# Patient Record
Sex: Male | Born: 1935
Health system: Southern US, Community
[De-identification: ages and names within clinical notes are randomized; demographics above are authoritative.]

## PROBLEM LIST (undated history)

## (undated) DIAGNOSIS — E7800 Pure hypercholesterolemia, unspecified: Secondary | ICD-10-CM

## (undated) DIAGNOSIS — I10 Essential (primary) hypertension: Secondary | ICD-10-CM

## (undated) DIAGNOSIS — K219 Gastro-esophageal reflux disease without esophagitis: Secondary | ICD-10-CM

## (undated) DIAGNOSIS — R609 Edema, unspecified: Secondary | ICD-10-CM

## (undated) DIAGNOSIS — I639 Cerebral infarction, unspecified: Secondary | ICD-10-CM

## (undated) DIAGNOSIS — E785 Hyperlipidemia, unspecified: Secondary | ICD-10-CM

## (undated) DIAGNOSIS — I4891 Unspecified atrial fibrillation: Secondary | ICD-10-CM

## (undated) DIAGNOSIS — I4892 Unspecified atrial flutter: Secondary | ICD-10-CM

## (undated) HISTORY — DX: Pure hypercholesterolemia, unspecified: E78.00

## (undated) HISTORY — DX: Unspecified atrial fibrillation: I48.91

## (undated) HISTORY — DX: Essential (primary) hypertension: I10

## (undated) HISTORY — DX: Unspecified atrial flutter: I48.92

## (undated) HISTORY — DX: Edema, unspecified: R60.9

## (undated) HISTORY — DX: Gastro-esophageal reflux disease without esophagitis: K21.9

## (undated) HISTORY — DX: Hyperlipidemia, unspecified: E78.5

---

## 1999-02-17 ENCOUNTER — Encounter: Payer: Self-pay | Admitting: *Deleted

## 1999-02-17 ENCOUNTER — Ambulatory Visit (HOSPITAL_COMMUNITY): Admission: RE | Admit: 1999-02-17 | Discharge: 1999-02-17 | Payer: Self-pay | Admitting: *Deleted

## 2003-04-16 ENCOUNTER — Encounter: Admission: RE | Admit: 2003-04-16 | Discharge: 2003-04-16 | Payer: Self-pay | Admitting: Gastroenterology

## 2003-04-16 ENCOUNTER — Encounter: Payer: Self-pay | Admitting: Gastroenterology

## 2003-04-30 ENCOUNTER — Encounter (INDEPENDENT_AMBULATORY_CARE_PROVIDER_SITE_OTHER): Payer: Self-pay | Admitting: *Deleted

## 2003-04-30 ENCOUNTER — Ambulatory Visit (HOSPITAL_COMMUNITY): Admission: RE | Admit: 2003-04-30 | Discharge: 2003-04-30 | Payer: Self-pay | Admitting: Gastroenterology

## 2005-10-25 ENCOUNTER — Inpatient Hospital Stay (HOSPITAL_COMMUNITY): Admission: EM | Admit: 2005-10-25 | Discharge: 2005-10-31 | Payer: Self-pay | Admitting: Emergency Medicine

## 2005-10-25 ENCOUNTER — Encounter: Payer: Self-pay | Admitting: Internal Medicine

## 2005-10-26 ENCOUNTER — Encounter (INDEPENDENT_AMBULATORY_CARE_PROVIDER_SITE_OTHER): Payer: Self-pay | Admitting: Interventional Cardiology

## 2006-02-13 ENCOUNTER — Ambulatory Visit (HOSPITAL_COMMUNITY): Admission: RE | Admit: 2006-02-13 | Discharge: 2006-02-13 | Payer: Self-pay | Admitting: Interventional Cardiology

## 2006-02-13 ENCOUNTER — Encounter: Payer: Self-pay | Admitting: Internal Medicine

## 2007-02-15 ENCOUNTER — Encounter: Payer: Self-pay | Admitting: Internal Medicine

## 2007-02-15 ENCOUNTER — Ambulatory Visit (HOSPITAL_COMMUNITY): Admission: RE | Admit: 2007-02-15 | Discharge: 2007-02-15 | Payer: Self-pay | Admitting: Interventional Cardiology

## 2008-01-31 ENCOUNTER — Encounter: Payer: Self-pay | Admitting: Internal Medicine

## 2008-01-31 ENCOUNTER — Ambulatory Visit (HOSPITAL_COMMUNITY): Admission: RE | Admit: 2008-01-31 | Discharge: 2008-01-31 | Payer: Self-pay | Admitting: Interventional Cardiology

## 2008-05-28 ENCOUNTER — Ambulatory Visit: Payer: Self-pay | Admitting: Internal Medicine

## 2008-06-30 ENCOUNTER — Ambulatory Visit: Payer: Self-pay | Admitting: Internal Medicine

## 2008-07-16 ENCOUNTER — Ambulatory Visit: Payer: Self-pay | Admitting: Internal Medicine

## 2008-10-14 ENCOUNTER — Ambulatory Visit: Payer: Self-pay | Admitting: Internal Medicine

## 2009-03-17 DIAGNOSIS — I498 Other specified cardiac arrhythmias: Secondary | ICD-10-CM | POA: Insufficient documentation

## 2009-03-17 DIAGNOSIS — I10 Essential (primary) hypertension: Secondary | ICD-10-CM | POA: Insufficient documentation

## 2009-03-17 DIAGNOSIS — R609 Edema, unspecified: Secondary | ICD-10-CM | POA: Insufficient documentation

## 2009-03-17 DIAGNOSIS — I4892 Unspecified atrial flutter: Secondary | ICD-10-CM | POA: Insufficient documentation

## 2009-03-17 HISTORY — DX: Edema, unspecified: R60.9

## 2009-03-18 ENCOUNTER — Ambulatory Visit: Payer: Self-pay | Admitting: Internal Medicine

## 2009-08-31 ENCOUNTER — Telehealth: Payer: Self-pay | Admitting: Internal Medicine

## 2009-09-22 ENCOUNTER — Ambulatory Visit: Payer: Self-pay | Admitting: Internal Medicine

## 2009-09-22 ENCOUNTER — Encounter: Payer: Self-pay | Admitting: Internal Medicine

## 2009-09-22 ENCOUNTER — Ambulatory Visit: Payer: Self-pay | Admitting: Cardiology

## 2009-09-22 LAB — CONVERTED CEMR LAB: POC INR: 2

## 2009-09-29 ENCOUNTER — Ambulatory Visit: Payer: Self-pay | Admitting: Cardiovascular Disease

## 2009-09-29 LAB — CONVERTED CEMR LAB: POC INR: 2.2

## 2009-10-04 ENCOUNTER — Telehealth: Payer: Self-pay | Admitting: Internal Medicine

## 2009-10-06 ENCOUNTER — Ambulatory Visit: Payer: Self-pay | Admitting: Cardiology

## 2009-10-06 ENCOUNTER — Ambulatory Visit: Payer: Self-pay | Admitting: Internal Medicine

## 2009-10-06 ENCOUNTER — Telehealth (INDEPENDENT_AMBULATORY_CARE_PROVIDER_SITE_OTHER): Payer: Self-pay | Admitting: *Deleted

## 2009-10-07 LAB — CONVERTED CEMR LAB
BUN: 16 mg/dL (ref 6–23)
Basophils Absolute: 0 10*3/uL (ref 0.0–0.1)
Basophils Relative: 0.5 % (ref 0.0–3.0)
CO2: 29 meq/L (ref 19–32)
Calcium: 9.9 mg/dL (ref 8.4–10.5)
Chloride: 101 meq/L (ref 96–112)
Creatinine, Ser: 1.3 mg/dL (ref 0.4–1.5)
Eosinophils Absolute: 0.2 10*3/uL (ref 0.0–0.7)
Eosinophils Relative: 2.5 % (ref 0.0–5.0)
GFR calc non Af Amer: 56.77 mL/min (ref >60)
Glucose, Bld: 123 mg/dL — ABNORMAL HIGH (ref 70–99)
HCT: 46.5 % (ref 39.0–52.0)
Hemoglobin: 15.2 g/dL (ref 13.0–17.0)
Lymphocytes Relative: 30.7 % (ref 12.0–46.0)
Lymphs Abs: 2.2 10*3/uL (ref 0.7–4.0)
MCHC: 32.7 g/dL (ref 30.0–36.0)
MCV: 102.5 fL — ABNORMAL HIGH (ref 78.0–100.0)
Magnesium: 2.1 mg/dL (ref 1.5–2.5)
Monocytes Absolute: 0.7 10*3/uL (ref 0.1–1.0)
Monocytes Relative: 10 % (ref 3.0–12.0)
Neutro Abs: 4.2 10*3/uL (ref 1.4–7.7)
Neutrophils Relative %: 56.3 % (ref 43.0–77.0)
Platelets: 236 10*3/uL (ref 150.0–400.0)
Potassium: 4.1 meq/L (ref 3.5–5.1)
RBC: 4.54 M/uL (ref 4.22–5.81)
RDW: 11.7 % (ref 11.5–14.6)
Sodium: 139 meq/L (ref 135–145)
WBC: 7.3 10*3/uL (ref 4.5–10.5)

## 2009-10-08 ENCOUNTER — Ambulatory Visit: Payer: Self-pay | Admitting: Internal Medicine

## 2009-10-08 ENCOUNTER — Ambulatory Visit (HOSPITAL_COMMUNITY): Admission: RE | Admit: 2009-10-08 | Discharge: 2009-10-08 | Payer: Self-pay | Admitting: Internal Medicine

## 2009-10-15 ENCOUNTER — Ambulatory Visit: Payer: Self-pay | Admitting: Cardiology

## 2009-10-15 LAB — CONVERTED CEMR LAB: POC INR: 2.7

## 2009-11-12 ENCOUNTER — Ambulatory Visit: Payer: Self-pay | Admitting: Cardiovascular Disease

## 2009-11-12 LAB — CONVERTED CEMR LAB: POC INR: 2.5

## 2009-12-08 ENCOUNTER — Telehealth (INDEPENDENT_AMBULATORY_CARE_PROVIDER_SITE_OTHER): Payer: Self-pay | Admitting: *Deleted

## 2010-09-25 LAB — CONVERTED CEMR LAB: POC INR: 2.3

## 2010-09-29 NOTE — Medication Information (Signed)
Summary: coumadin check for cardioversion  Anticoagulant Therapy  Managed by: Cloyde Reams, RN, BSN Referring MD: Ladona Ridgel MD, Chauncey Fischer MD: Jens Som MD, Arlys John Indication 1: Atrial Fibrillation Indication 2: Pending DCCV 09/2010 Lab Used: [facility] [place] Site: [address] INR POC 2.3 INR RANGE 2.0-3.0           Allergies: No Known Drug Allergies  Anticoagulation Management History:      The patient is taking warfarin and comes in today for a routine follow up visit.  Positive risk factors for bleeding include an age of 3 years or older.  The bleeding index is 'intermediate risk'.  Positive CHADS2 values include History of HTN and Age > 76 years old.  Anticoagulation responsible provider: Jens Som MD, Arlys John.  INR POC: 2.3.  Cuvette Lot#: [id number].  Exp: 11/2010.    Anticoagulation Management Assessment/Plan:      The patient's current anticoagulation dose is Warfarin sodium 1 mg tabs: Use as directed by Anticoagualtion Clinic.  The target INR is 2.0-3.0.  The next INR is due 10/15/2009.  Anticoagulation instructions were given to patient.  Results were reviewed/authorized by Cloyde Reams, RN, BSN.  He was notified by Cloyde Reams RN.         Prior Anticoagulation Instructions: INR 2.2  Start taking 1 tablet daily except 1/2 tablet on Mondays and Fridays.  Recheck in 1 week.  Current Anticoagulation Instructions: INR 2.3  Take an extra 1/2 table today then resume same dosage of 1 tablet daily except 1/2 tablet on Mondays and Fridays.  Recheck in 1 week.

## 2010-09-29 NOTE — Progress Notes (Signed)
Summary: hr 136 At Flutter  Metoprolol increased cardioversion 10/08/09  Phone Note Other Incoming   Caller: pt here for bloodwork before cardioversion Details for Reason: abn EKG Summary of Call: checked EKG prior to cardioversion scheduled for 10/08/2009 - At Flutter HR 136, Bp 142/108 and 156/101.  pt states at home this am BP was 123/89.  Past Dr Ludwig Clarks review - pt is to increase Metoprolol to 100mg  twice a day until the time of his cardioversion.  pt aware. Initial call taken by: Charolotte Capuchin, RN,  October 06, 2009 10:44 AM      Patient Instructions: 1)  Your physician has recommended you make the following change in your medication: increase Metoprolol to 100 mg twice a day. 2)  Keep appoitment on 10/08/09 for cardioversion as scheduled 3)  Your physician has recommended that you have a cardioversion (DCCV).  Electrical cardioversion uses a jolt of electricity to your heart either through paddles or wired patches attached to your chest. This is a controlled, usually prescheduled, procedure. Defibrillation is done under light anesthesia in the hospital, and you usually go home the day of the procedure. This is done to get your heart back into a normal rhythm. You are not awake for the procedure. Please see the instruction sheet given to you today. 4)  You have been diagnosed with atrial flutter. Atrial flutter is a condition in which one of the upper chambers of the heart has extra electrical cells causing it to beat very fast. Please see the handout/brochure given to you today for further information.

## 2010-09-29 NOTE — Medication Information (Signed)
Summary: rov/ewj  Anticoagulant Therapy  Managed by: Inactive Referring MD: Ladona Ridgel MD, Chauncey Fischer MD: Clifton James MD, Cristal Deer Indication 1: Atrial Fibrillation Indication 2: Pending DCCV 09/2010 Lab Used: [facility] [place] Site: [address] INR POC 2.5 INR RANGE 2.0-3.0  Dietary changes: no    Health status changes: no    Bleeding/hemorrhagic complications: no    Recent/future hospitalizations: no    Any changes in medication regimen? no    Recent/future dental: no  Any missed doses?: no       Is patient compliant with meds? yes      Comments: Pt. wants to follow back up at Dr. Katrinka Blazing office with Mason Jim Smart.   Allergies: No Known Drug Allergies  Anticoagulation Management History:      The patient is taking warfarin and comes in today for a routine follow up visit.  Positive risk factors for bleeding include an age of 75 years or older.  The bleeding index is 'intermediate risk'.  Positive CHADS2 values include History of HTN and Age > 38 years old.  Anticoagulation responsible provider: Clifton James MD, Cristal Deer.  INR POC: 2.5.  Cuvette Lot#: [id number].  Exp: 12/2010.    Anticoagulation Management Assessment/Plan:      The patient's current anticoagulation dose is Warfarin sodium 1 mg tabs: Use as directed by Anticoagualtion Clinic.  The target INR is 2.0-3.0.  The next INR is due 12/08/2009.  Anticoagulation instructions were given to patient.  Results were reviewed/authorized by Inactive.  He was notified by Bethena Midget, RN, BSN.         Prior Anticoagulation Instructions: INR 2.7  Continue on same dosage 1 tablet daily except 1/2 tablet on Mondays and Fridays.  Recheck in 4 weeks.    Current Anticoagulation Instructions: INR 2.5 Continue 5mg s daily except 2.5mg s on Mondays and Fridays. Recheck in 4 weeks. Will recheck back with Dr. Katrinka Blazing office on 12/08/09 at 11am.

## 2010-09-29 NOTE — Progress Notes (Signed)
  Faxed LOV,12 Lead over to Ilene/r.Smith office to [phone] [facility]  December 08, 2009 12:03 PM

## 2010-09-29 NOTE — Medication Information (Signed)
Summary: rov/ewj  Anticoagulant Therapy  Managed by: Cloyde Reams, RN, BSN Referring MD: Ladona Ridgel MD, Chauncey Fischer MD: Shirlee Latch MD, Jasyah Theurer Indication 1: Atrial Fibrillation Indication 2: Pending DCCV 09/2010 Lab Used: [facility] [place] Site: [address] INR POC 2.7 INR RANGE 2.0-3.0  Dietary changes: no    Health status changes: no    Bleeding/hemorrhagic complications: no    Recent/future hospitalizations: yes       Details: Had DCCV 10/08/09 sick since then with cold.    Any changes in medication regimen? yes       Details: Tylenol prn  Recent/future dental: no  Any missed doses?: no       Is patient compliant with meds? yes       Allergies (verified): No Known Drug Allergies  Anticoagulation Management History:      The patient is taking warfarin and comes in today for a routine follow up visit.  Positive risk factors for bleeding include an age of 26 years or older.  The bleeding index is 'intermediate risk'.  Positive CHADS2 values include History of HTN and Age > 45 years old.  Anticoagulation responsible provider: Shirlee Latch MD, Saulo Anthis.  INR POC: 2.7.  Cuvette Lot#: [id number].  Exp: 11/2010.    Anticoagulation Management Assessment/Plan:      The patient's current anticoagulation dose is Warfarin sodium 1 mg tabs: Use as directed by Anticoagualtion Clinic.  The target INR is 2.0-3.0.  The next INR is due 11/12/2009.  Anticoagulation instructions were given to patient.  Results were reviewed/authorized by Cloyde Reams, RN, BSN.  He was notified by Cloyde Reams RN.         Prior Anticoagulation Instructions: INR 2.3  Take an extra 1/2 table today then resume same dosage of 1 tablet daily except 1/2 tablet on Mondays and Fridays.  Recheck in 1 week.  Current Anticoagulation Instructions: INR 2.7  Continue on same dosage 1 tablet daily except 1/2 tablet on Mondays and Fridays.  Recheck in 4 weeks.

## 2010-09-29 NOTE — Medication Information (Signed)
Summary: rov/tm  Anticoagulant Therapy  Managed by: Cloyde Reams, RN, BSN Referring MD: Ladona Ridgel MD, Chauncey Fischer MD: Eden Emms MD, Theron Arista Indication 1: Atrial Fibrillation Indication 2: Pending DCCV 09/2010 Lab Used: [facility] [place] Site: [address] INR POC 2.2 INR RANGE 2.0-3.0  Dietary changes: no    Health status changes: no    Bleeding/hemorrhagic complications: no    Recent/future hospitalizations: no    Any changes in medication regimen? no    Recent/future dental: no  Any missed doses?: no       Is patient compliant with meds? yes      Comments: Pt states he has been taking 5mg  daily/2.5mg  MWF.    Allergies (verified): No Known Drug Allergies  Anticoagulation Management History:      The patient is taking warfarin and comes in today for a routine follow up visit.  Positive risk factors for bleeding include an age of 75 years or older.  The bleeding index is 'intermediate risk'.  Positive CHADS2 values include History of HTN and Age > 64 years old.  Anticoagulation responsible provider: Eden Emms MD, Theron Arista.  INR POC: 2.2.  Cuvette Lot#: [id number].  Exp: 11/2010.    Anticoagulation Management Assessment/Plan:      The patient's current anticoagulation dose is Warfarin sodium 1 mg tabs: Use as directed by Anticoagualtion Clinic.  The target INR is 2.0-3.0.  The next INR is due 10/06/2009.  Anticoagulation instructions were given to patient.  Results were reviewed/authorized by Cloyde Reams, RN, BSN.  He was notified by Cloyde Reams RN.         Prior Anticoagulation Instructions: INR 2.0 Change dose to 5mg s everyday except 2.5mg s on Mondays and Fridays. Recheck in one week.   Current Anticoagulation Instructions: INR 2.2  Start taking 1 tablet daily except 1/2 tablet on Mondays and Fridays.  Recheck in 1 week.

## 2010-09-29 NOTE — Letter (Signed)
Summary: Cardioversion/TEE Instructions  Architectural technologist, Main Office  [address]   [place], [place] [address]   Phone: [phone]  Fax: [phone]    Cardioversion / TEE Cardioversion Instructions  You are scheduled for a Cardioversion  on Oct 08, 2009 with Dr. Ladona Ridgel.   Please arrive at the [facility] at 1100 a.m. on the day of your procedure.  1)   DIET:  A)   Nothing to eat or drink after midnight except your medications with a sip of water.   2)   Come to the [place] office on Oct 04, 2009 for lab work. The lab at [facility] is open from 8:30 a.m. to 1:30 p.m. and 2:30 p.m. to 5:00 p.m. The lab at [facility] is open from 7:30 a.m. to 5:30 p.m. You do not have to be fasting.  3)   MAKE SURE YOU TAKE YOUR COUMADIN.  A)   YOU MAY TAKE ALL of your remaining medications with a small amount of water.   4)  Must have a responsible person to drive you home.  5)   Bring a current list of your medications and current insurance cards.   * Special Note:  Every effort is made to have your procedure done on time. Occasionally there are emergencies that present themselves at the hospital that may cause delays. Please be patient if a delay does occur.  * If you have any questions after you get home, please call the office at [phone].  Appended Document: Cardioversion/TEE Instructions LABS CHANGED TO 10/06/09

## 2010-09-29 NOTE — Progress Notes (Signed)
Summary: irregular HR  Phone Note Call from Patient Call back at Home Phone [phone]   Caller: Spouse Reason for Call: Talk to Nurse Summary of Call: Spouse states patient is experiencing irregular heart rate.  PA appointment offered, declined.  Scheduled with [facility] 09-22-08.  Would like to speak to RN concerning symptoms. Initial call taken by: Burnard Leigh,  August 31, 2009 9:53 AM  Follow-up for Phone Call        BP good today at [facility] clinic with Riki Rusk at Dr Katrinka Blazing  Wife says they said it was perfect.  Now on Toprol 50 mg daily. Made an apt for 09/22/09.  If she feels like his BP gets to low or his HR is getting too high we will move his apt up.  Pt's wife will call me back if symptoms worsen. Dennis Bast, RN, BSN  August 31, 2009 11:56 AM

## 2010-09-29 NOTE — Assessment & Plan Note (Signed)
Summary: rov/kfw   History of Present Illness: [name] returns today for followup.  He is a very pleasant 77 year old male with a history of sinus bradycardia, which has been asymptomatic and hypertension, also with a history of atrial flutter. He has done quite well over the past 6 months.  He denies c/p, sob, syncope or palpitations.  He notes that his blood pressure has been on the low side at times but he does not feel dizzy.   Current Medications (verified): 1)  Amlodipine Besylate 5 Mg Tabs (Amlodipine Besylate) .... Take One-Half  Tablet By Mouth Daily 2)  Warfarin Sodium 1 Mg Tabs (Warfarin Sodium) .... Use As Directed By Anticoagualtion Clinic 3)  Benicar Hct 20-12.5 Mg Tabs (Olmesartan Medoxomil-Hctz) .... Daily 4)  Lipitor 20 Mg Tabs (Atorvastatin Calcium) .... Take One Tablet By Mouth Daily. 5)  Calcarb 600 1500 Mg Tabs (Calcium Carbonate) .Marland Kitchen.. 1 By Mouth Once Daily 6)  Fish Oil   Oil (Fish Oil) .Marland Kitchen.. 1 By Mouth Once Daily 7)  Multivitamins   Tabs (Multiple Vitamin) .Marland Kitchen.. 1 By Mouth Once Daily 8)  Metoprolol Succinate 100 Mg Xr24h-Tab (Metoprolol Succinate) .... Take One-Half Tablet By Mouth Daily  Allergies (verified): No Known Drug Allergies  Past History:  Past Medical History: Last updated: 03/17/2009 ATRIAL FLUTTER (ICD-427.32) BRADYCARDIA (ICD-427.89) EDEMA (ICD-782.3) HYPERTENSION (ICD-401.9) SUPRAVENTRICULAR TACHYCARDIA (ICD-427.89)    Past Surgical History: Last updated: 03/17/2009 Esophagogastroduodenoscopy with biopsy and endoscopic balloon  dilatation of a distal esophageal stricture. Transesophageal echocardiogram and cardioversion.  Cardioversion.  01/31/2008  Jake Bathe, MD   Elective electrical cardioversion.  02/13/2006  Lyn Records, M.D.       Family History: Last updated: 03/17/2009  He is married and lives in [place].  He has one child.  No tobacco, alcohol, or illicit drug use.  Social History: Last updated: 03/17/2009  He is married and lives in [place].  He has one child.  No tobacco, alcohol, or illicit drug use.  Review of Systems  The patient denies chest pain, syncope, dyspnea on exertion, and peripheral edema.    Vital Signs:  Patient profile:   77 year old male Height:      69 inches Weight:      189 pounds BMI:     28.01 Pulse rate:   150 / minute Pulse rhythm:   irregular BP sitting:   146 / 84  (left arm) Cuff size:   regular  Vitals Entered By: Judithe Modest CMA (September 22, 2009 9:10 AM)  Physical Exam  General:  Well developed, well nourished, in no acute distress. Head:  normocephalic and atraumatic Eyes:  PERRLA/EOM intact; conjunctiva and lids normal. Mouth:  Teeth, gums and palate normal. Oral mucosa normal. Neck:  Neck supple, no JVD. No masses, thyromegaly or abnormal cervical nodes. Lungs:  Clear bilaterally to auscultation. No wheezes, rales, or rhonchi. Heart:  Regular bradycardia.  Nomal S1 and S2.  PMI is not enlarged or laterally displaced.  No murmur. Abdomen:  Bowel sounds positive; abdomen soft and non-tender without masses, organomegaly, or hernias noted. No hepatosplenomegaly. Msk:  Back normal, normal gait. Muscle strength and tone normal. Pulses:  pulses normal in all 4 extremities Extremities:  No clubbing or cyanosis. Neurologic:  Alert and oriented x 3.   Impression & Recommendations:  Problem # 1:  ATRIAL FLUTTER (ICD-427.32) [name] has asymptomatic atrial flutter and I have discussed the treatment options with the patient.  I have recomended proceeding with DCCV to restore  NSR. His updated medication list for this problem includes:    Warfarin Sodium 1 Mg Tabs (Warfarin sodium) ..... Use as directed by anticoagualtion clinic    Metoprolol Succinate 100 Mg Xr24h-tab (Metoprolol succinate) .Marland Kitchen... Take one-half tablet by mouth twice daily  Orders: EKG w/ Interpretation (93000)  Problem # 2:  HYPERTENSION (ICD-401.9) The patient had been  instructed to maintain a low sodium diet and continue his current meds. His updated medication list for this problem includes:    Amlodipine Besylate 5 Mg Tabs (Amlodipine besylate) .Marland Kitchen... Take one-half  tablet by mouth daily    Benicar Hct 20-12.5 Mg Tabs (Olmesartan medoxomil-hctz) .Marland Kitchen... Daily    Metoprolol Succinate 100 Mg Xr24h-tab (Metoprolol succinate) .Marland Kitchen... Take one-half tablet by mouth twice daily  Problem # 3:  BRADYCARDIA (ICD-427.89) He has had a h/o bradycardia.  Will follow. His updated medication list for this problem includes:    Amlodipine Besylate 5 Mg Tabs (Amlodipine besylate) .Marland Kitchen... Take one-half  tablet by mouth daily    Warfarin Sodium 1 Mg Tabs (Warfarin sodium) ..... Use as directed by anticoagualtion clinic    Metoprolol Succinate 100 Mg Xr24h-tab (Metoprolol succinate) .Marland Kitchen... Take one-half tablet by mouth twice daily  Patient Instructions: 1)  Your physician has recommended you make the following change in your medication: INCREASE YOUR METOPROLOL TO 50MG  TWICE DAILY 2)  Your physician has recommended that you have a cardioversion (DCCV).  Electrical cardioversion uses a jolt of electricity to your heart either through paddles or wired patches attached to your chest. This is a controlled, usually prescheduled, procedure. Defibrillation is done under light anesthesia in the hospital, and you usually go home the day of the procedure. This is done to get your heart back into a normal rhythm. You are not awake for the procedure. Please see the instruction sheet given to you today. 3)  Your physician recommends that you return for lab work ON Oct 04, 2009 Prescriptions: METOPROLOL SUCCINATE 100 MG XR24H-TAB (METOPROLOL SUCCINATE) Take one-half tablet by mouth twice daily  #30 x 11   Entered by:   Duncan Dull, RN, BSN   Authorized by:   Laren Boom, MD, [facility]   Signed by:   Duncan Dull, RN, BSN on 09/22/2009   Method used:   Electronically to        [facility] (retail)       [address]       [place], [place]  [address]       Ph: [phone]       Fax: [phone]   RxID:   [id number]

## 2010-09-29 NOTE — Progress Notes (Signed)
Summary: CALLING REGARDING HIS CARDIOVERSION  Phone Note Call from Patient Call back at Home Phone [phone]   Caller: Patient Summary of Call: PT  CALLING REGARDING HIS PROCEDURE THE CARDIOVERSION. Initial call taken by: Judie Grieve,  October 04, 2009 11:08 AM  Follow-up for Phone Call        will check an EKG on Jhonnie Garner if he is in rhythm will cx DCCV Dennis Bast, RN, BSN  October 04, 2009 12:25 PM

## 2010-09-29 NOTE — Medication Information (Signed)
Summary: rov/tm  Anticoagulant Therapy  Managed by: Bethena Midget, RN, BSN Referring MD: Ladona Ridgel MD, Chauncey Fischer MD: Juanda Chance MD, Dezi Schaner Indication 1: Atrial Fibrillation Indication 2: Pending DCCV 09/2010 Lab Used: [facility] [place] Site: [address] INR POC 2.0 INR RANGE 2.0-3.0  Dietary changes: no    Health status changes: no    Bleeding/hemorrhagic complications: no    Recent/future hospitalizations: no    Any changes in medication regimen? no    Recent/future dental: no  Any missed doses?: no       Is patient compliant with meds? yes      Comments: Saw Dr. Ladona Ridgel today, now pending DCCV. Usually followed by [facility]. Telephoned Dr. Jimmye Norman and received pt. previous dose of 5mg s daily except 2.5mg s on MWF.   Allergies: No Known Drug Allergies  Anticoagulation Management History:      The patient comes in today for his initial visit for anticoagulation therapy.  Positive risk factors for bleeding include an age of 75 years or older.  The bleeding index is 'intermediate risk'.  Positive CHADS2 values include History of HTN and Age > 75 years old.  Anticoagulation responsible provider: Juanda Chance MD, Smitty Cords.  INR POC: 2.0.  Cuvette Lot#: [id number].  Exp: 11/2010.    Anticoagulation Management Assessment/Plan:      The patient's current anticoagulation dose is Warfarin sodium 1 mg tabs: Use as directed by Anticoagualtion Clinic.  The next INR is due 09/29/2009.  Anticoagulation instructions were given to patient.  Results were reviewed/authorized by Bethena Midget, RN, BSN.  He was notified by Bethena Midget, RN, BSN.         Current Anticoagulation Instructions: INR 2.0 Change dose to 5mg s everyday except 2.5mg s on Mondays and Fridays. Recheck in one week.   Appended Document: rov/tm Last INR per [facility] on 08/31/09 was 1.9

## 2010-09-29 NOTE — Miscellaneous (Signed)
Summary: Orders Update  Clinical Lists Changes  Orders: Added new Test order of T-2 View CXR (71020TC) - Signed 

## 2010-11-16 LAB — PROTIME-INR
INR: 2.57 — ABNORMAL HIGH (ref 0.00–1.49)
Prothrombin Time: 27.4 seconds — ABNORMAL HIGH (ref 11.6–15.2)

## 2011-01-10 NOTE — Consult Note (Signed)
NAMEGRAESON, NOURI NO.:  [id number]   MEDICAL RECORD NO.:  [id number]          PATIENT TYPE:  OIB   LOCATION:  2858                         FACILITY:  [facility]   PHYSICIAN:  Lyn Records, M.D.   DATE OF BIRTH:  1929/10/02   DATE OF CONSULTATION:  02/15/2007  DATE OF DISCHARGE:                                 CONSULTATION   INDICATION:  Atrial tachycardia, with a regular RR interval at a rate of  120 beats per minute.  The patient has a history of atrial flutter.   CURRENT MEDICATIONS:  His current medical regimen is:  1. Metoprolol-ER 75 mg per day.  2. Coumadin as directed.  3. Lipitor 20 mg a day.  4. Amiodarone 200 mg b.i.d.  5. Benicar 40/25 mg per day.  6. Multivitamins and calcium.   PROCEDURE:  International aid/development worker cardioversion.   DESCRIPTION:  Dr. Sampson Goon directed the anesthesia.  The patient was  given 150 mg of sodium pentothal.   After the patient was asleep, we converted to normal sinus rhythm/sinus  bradycardia with an anterior and posterior lead configuration using 75  watt-seconds of biphasic energy.  One discharge was performed.  Patient  awakened from anesthesia without complications.   PLAN:  1. We will decrease metoprolol dose to 50 mg per day.  2. Continue Coumadin.  3. Clinical followup in 2-3 weeks.  4. The patient should call if any clinical problems.  5. Decrease amiodarone to 200 mg per day.      Lyn Records, M.D.  Electronically Signed     HWS/MEDQ  D:  02/15/2007  T:  02/15/2007  Job:  [id number]   cc:   Stacie Acres. Cliffton Asters, M.D.

## 2011-01-10 NOTE — Op Note (Signed)
NAMEEWEN, VARNELL NO.:  [id number]   MEDICAL RECORD NO.:  [id number]          PATIENT TYPE:  OIB   LOCATION:  2854                         FACILITY:  [facility]   PHYSICIAN:  Jake Bathe, MD      DATE OF BIRTH:  1930/07/17   DATE OF PROCEDURE:  01/31/2008  DATE OF DISCHARGE:                               OPERATIVE REPORT   PROCEDURE:  Cardioversion.   INDICATIONS:  A 77 year old male on amiodarone with atrial  flutter/fibrillation on chronic Coumadin therapy, last INR 3.0.  He has  had previous cardioversion x3.  The patient of Dr. Verdis Prime.   PROCEDURE DETAILS:  Informed consent was obtained.  Risks and benefits  such as stroke, heart attack, death were explained to the patient.  With  anesthesia help, sedation was obtained.  Pads were placed in the  anterior and posterior chest wall.  Biphasic 120 joules were  administered x1.  This successfully cardioverted him from atrial flutter  to normal sinus rhythm at a rate of 66.  He tolerated the procedure well  with no complications.  Findings were discussed with his wife.   PLAN:  Follow up in office as previously scheduled.  Successful  cardioversion.      Jake Bathe, MD  Electronically Signed     MCS/MEDQ  D:  01/31/2008  T:  01/31/2008  Job:  [id number]

## 2011-01-10 NOTE — Letter (Signed)
May 28, 2008    Lyn Records, MD  [address]  [address]  [place] [address]   RE:  Alexander, Roman  MRN:  [id number]  /  DOB:  [birth year]   Dear Erskine Emer[facility],   Thank you for referring [name] for EP evaluation of atrial  flutter.  As you know, he is a very pleasant 77 year old man who  has a  history of atrial fibrillation dating back several years, but most  recently has been all atrial flutter.  The patient has had multiple  cardioversions and had been placed on amiodarone to help control his  atrial flutter.  Despite relatively high doses of amio, he continues to  maintain atrial flutter.  He was cardioverted several months ago and  stayed in sinus rhythm about 1 month by his report.  The patient  interestingly enough does not have much in the way of symptoms with  regard to his atrial arrhythmias.  He states that he checks his blood  pressure and heart rate very frequently with his blood pressure cuff and  notes that his heart rates varies from the high 80s to the low 110  range.  He does not feel palpitations and has not really ever felt  palpitations.  He remains active and he is able to mow his grass and  denies any problems maintaining activities of daily living.  He has  never had frank syncope.  He denies peripheral edema.   CURRENT MEDICATIONS:  1. Amlodipine 5 a day.  2. Warfarin as directed.  3. Benicar/HCTZ 20/12.5 daily.  4. Lipitor 20 a day.  5. Calcium supplements daily.  6. Amiodarone 200 mg twice daily.   FAMILY HISTORY:  Noncontributory with his advanced age.   SOCIAL HISTORY:  The patient is married.  He denies tobacco or ethanol  abuse.   REVIEW OF SYSTEMS:  His review of systems are really unremarkable.  Please note, all systems were reviewed.   PHYSICAL EXAMINATION:  He is a pleasant well-appearing 77 year old man  in no acute distress.  The blood pressure was 170/100, the pulse was 97 and regular, the  respirations were 18,  weight was 186 pounds.  HEENT:  Normocephalic and atraumatic.  Pupils equal and round.  Oropharynx was moist.  Sclerae anicteric.  NECK:  No jugular venous distention.  No thyromegaly.  Trachea was  midline.  Carotid are 2+ and symmetric.  LUNGS:  Clear bilaterally to auscultation.  No wheezes, rales, or  rhonchi are present.  There was no increased work of breathing.  CARDIOVASCULAR:  Regular rate and rhythm with normal S1 and S2.  The PMI  was not enlarged nor was laterally displaced.  ABDOMEN:  Soft, nontender, nondistended.  There was no organomegaly.  The bowel sounds are present.  There was no rebound or guarding.  EXTREMITIES:  Demonstrated no cyanosis, clubbing, or edema.  SKIN:  Normal.  NEUROLOGIC:  Alert and oriented x3.  Cranial nerves are intact.  Strength was 5/5 and symmetric.   EKG demonstrates atrial flutter with 2:1 AV conduction.   IMPRESSION:  Persistent atrial flutter.  Review of the patient's 12-lead  EKG today demonstrates a positive flutter wave in lead V1 as well as the  inferior leads, which is consistent with a left atrial flutter.   Hank, I have discussed treatment options with [name] in detail.  The  most striking finding that I have from this patient is that he is really  asymptomatic  and able to do whatever he wants and not have much in the  way of symptoms despite being in atrial flutter at a rate of 97 beats  per minute.  My concern unfortunately is that he developed tachycardia-  induced cardiomyopathy, as his rate remains elevated and is like this  all the time.  He has had multiple cardioversions in the past and I do  not think repeating the cardioversion at this point makes a lot of  sense.  One option for [name] would be to proceed with catheter  ablation, but the morphology of his 12-lead EKG and atrial flutter  strongly suggest that his flutter short circuit is actually in the left  atrium.  Because the patient is relatively  asymptomatic, in fact, the  best I can tell he is completely asymptomatic, I think rather than  continuation of strategy of amiodarone that  consideration should be  made for rate control alone.  To this end, I have asked that he stop his  amiodarone and I have given him prescription today for initiation of  Toprol as well as digoxin in hopes that the combination of the 2 will  control his rate.  My guess is that when he is no longer  on amiodarone that his atrial flutter will revert back to AFib and that  his ventricular rate will be much easier to control when he is in AFib,  particularly once he is on both combination of digoxin and metoprolol.  I will plan to see the patient back in several weeks.  Thanks again for  referring [name] for EP evaluation.    Sincerely,      Doylene Canning. Ladona Ridgel, MD  Electronically Signed    GWT/MedQ  DD: 05/28/2008  DT: 05/29/2008  Job #: [id number]

## 2011-01-10 NOTE — Assessment & Plan Note (Signed)
[facility]                         ELECTROPHYSIOLOGY OFFICE NOTE   NAME:FIELDSYosef, Krogh                     MRN:          [id number]  DATE:07/16/2008                            DOB:          10-Nov-1929    Mr. Ferrari returns today for followup.  He is a very pleasant elderly  male with a history of symptomatic bradycardia as well as atrial  flutter.  He has been refractory to antiarrhythmic drug therapy,  specifically amiodarone did not keep him in sinus rhythm.  The patient  was tried on rate control when I initially met him and we had stopped  his amiodarone, started him on digoxin, and the combination resulted in  very severe bradycardia for which he felt poorly.  Digoxin was  discontinued.  His metoprolol was decreased to 50 mg a day and it still  took about 2 weeks before he started to feel better.  In the last week,  he has improved however.   CURRENT MEDICATIONS:  1. Amlodipine 5 mg a day.  2. Warfarin as directed.  3. Benicar/HCTZ 20/12.5 a day.  4. Lipitor 20 a day.  5. Calcium supplement.  6. Metoprolol ER 100 mg half tablet daily.   PHYSICAL EXAMINATION:  GENERAL:  He is a pleasant well-appearing 77-year-  old man in no distress.  VITAL SIGNS:  Blood pressure was 150/80, the pulse is 49 and regular,  respirations were 18, and weight was 183 pounds.  NECK:  No jugular venous distention.  LUNGS:  Clear bilaterally to auscultation. No wheezes, rales, or  rhonchi.  CARDIOVASCULAR:  Regular bradycardia with normal S1 and S2.  ABDOMEN:  Soft and nontender.  EXTREMITIES:  No edema.   EKG demonstrated sinus bradycardia with first-degree AV block.   IMPRESSION:  1. Asymptomatic bradycardia.  2. Hypertension.  3. History of atrial flutter, now in sinus rhythm.   DISCUSSION:  Mr. Durnil is stable.  Long-term he also may require  flutter ablation, though I have not been aggressive about this because I  suspect he has a left atrial  flutter, making ablation more difficult.  He remains bradycardic on just low-dose metoprolol and off digoxin, but  at this point, he is not particularly symptomatic and has gone back to  his usual activities.  For  this reason, we will plan a period of watchful waiting.  I will see him  back in 3 months.  He was instructed to call me if he goes into atrial  flutter again.     Doylene Canning. Ladona Ridgel, MD  Electronically Signed    GWT/MedQ  DD: 07/16/2008  DT: 07/16/2008  Job #: [id number]   cc:   Lyn Records, M.D.  Stacie Acres Cliffton Asters, M.D.

## 2011-01-10 NOTE — Assessment & Plan Note (Signed)
[facility]                         ELECTROPHYSIOLOGY OFFICE NOTE   NAME:FIELDSLeeum, Sankey                     MRN:          [id number]  DATE:06/30/2008                            DOB:          1929/12/08    Mr. Fife returns today for followup.  He is a very pleasant male with  a history of incessant left atrial flutter and also with a history of  hypertension who is referred been by Dr. Verdis Prime for evaluation of  atrial flutter.  The patient because of his atypical-appearing flutter,  it was recommended that we try him with rate control as he had failed  amiodarone and multiple cardioversions.  His amiodarone was discontinued  and we started him on metoprolol and digoxin.  Over the last 3 weeks,  the patient has felt particularly fatigued and tired.  He also notes  that his appetite has decreased and his vision has changed and he thinks  that things seem more yellow than usual.  All of the above are very  classics and consistent with digoxin access.  He has had no other  arrhythmias noted.  His resting heart rate has been between 36 and 45  beats per minute.  Despite this, he denies chest pain or shortness of  breath.  He has had no syncope.  He has been very sedentary.   MEDICATIONS:  His medicines today include;  1. Amlodipine 5 a day.  2. Warfarin as directed.  3. Benicar/HCTZ 20/12 a day.  4. Lipitor 20 a day.  5. Multivitamin.  6. Digoxin 0.25 daily which he held today.  7. Metoprolol ER 100 a day.   PHYSICAL EXAMINATION:  GENERAL:  He is a pleasant elderly-appearing man  in no distress.  VITAL SIGNS:  Blood pressure 160/80, the pulse 45 and regular,  respirations were 18.  NECK:  No jugular venous distention.  LUNGS:  Clear bilaterally to auscultation.  No wheezes, rales, or  rhonchi are present.  CARDIAC:  Regular bradycardia.  Normal S1 and S2.  ABDOMINAL:  Soft, nontender.  EXTREMITIES:  No edema.   EKG demonstrates sinus  bradycardia at 45 beats per minute.   IMPRESSION:  1. Atrial flutter (atypical) with a rapid ventricular response without      much in the way of symptoms.  2. Now symptomatic bradycardia with likely digoxin excess.  3. Hypertension.   DISCUSSION:  I have asked Mr. Rudell to stop his digoxin for now.  I  have asked him to decrease his dose of long-acting metoprolol from 100 a  day to 50 a day starting tomorrow.  I will plan to see the patient back  in a couple of weeks.  My expectation is that his heart rate will not  increased, though it is uncertain whether he will end up back in atrial  flutter or not.  We will make additional recommendations based on how he  does over the next couple of weeks.  If he has any syncope or other  symptoms, he is instructed to come to the emergency room.     Doylene Canning. Ladona Ridgel, MD  Electronically Signed    GWT/MedQ  DD: 06/30/2008  DT: 07/01/2008  Job #: [id number]   cc:   Lyn Records, M.D.

## 2011-01-10 NOTE — Assessment & Plan Note (Signed)
[facility]                         ELECTROPHYSIOLOGY OFFICE NOTE   NAME:FIELDSKayen, Grabel                     MRN:          [id number]  DATE:10/14/2008                            DOB:          1929/10/26    Mr. Plaza returns today for followup.  He is a very pleasant 77-year-  old male with a history of sinus bradycardia, which has been  asymptomatic and hypertension, also with a history of atrial flutter.  Unfortunately, since his cardioversion several months ago, he has  maintained sinus rhythm.  The patient returns today for followup.  He  notes that his heart rate has been in the 40-50 range.  He has been,  however, asymptomatic from this and feels well.  He has remained active.   MEDICATIONS:  1. Amlodipine 5 mg a day.  2. Warfarin as directed.  3. Benicar/HCTZ as directed.  4. Lipitor 20 a day.  5. Fish oil supplement.  6. Calcium 600 daily.  7. Metoprolol ER 100 mg half tablet daily.   PHYSICAL EXAMINATION:  GENERAL:  He is a pleasant well-appearing 77-year-  old man in no acute distress.  VITAL SIGNS:  Blood pressure today was 160/90; the pulse 46 and regular;  the respirations were 18; and weight was 190 pounds, up 7 pounds from  the visit in November.  NECK:  No jugular venous distention.  LUNGS:  Clear bilaterally to auscultation.  No wheezes, rales, or  rhonchi are present.  There is no increased work of breathing.  CARDIOVASCULAR:  Regular bradycardia with normal S1 and S2.  No murmurs,  rubs, or gallops were appreciated.  ABDOMEN:  Soft, nontender.  EXTREMITIES:  No edema.   IMPRESSION:  1. Asymptomatic bradycardia.  2. Hypertension.  3. Atrial flutter, which has been well controlled.   DISCUSSION:  Mr. Urizar is stable.  He has maintained sinus bradycardia  very nicely.  I will plan to see back in 5 months.  He was instructed to  call if he has recurrent symptomatic atrial fibrillation or flutter.     Doylene Canning. Ladona Ridgel,  MD  Electronically Signed    GWT/MedQ  DD: 10/14/2008  DT: 10/14/2008  Job #: [id number]   cc:   Lyn Records, M.D.

## 2011-01-13 NOTE — Cardiovascular Report (Signed)
[name], [name] NO.:  [id number]   MEDICAL RECORD NO.:  [id number]          PATIENT TYPE:  OIB   LOCATION:  2871                         FACILITY:  [facility]   PHYSICIAN:  Lyn Records, M.D.   DATE OF BIRTH:  01/16/1930   DATE OF PROCEDURE:  02/13/2006  DATE OF DISCHARGE:                              CARDIAC CATHETERIZATION   INDICATIONS:  Atrial flutter.   PROCEDURE PERFORMED:  Elective electrical cardioversion.   DESCRIPTION:  The patient was seen and administered propofol by Dr. Noreene Larsson  at 50 mg IV.  Once the patient was asleep with an anterior-posterior lead  configuration, 75 watts of biphasic energy was discharged once.  The patient  reverted to normal sinus rhythm/sinus bradycardia.  No complications  occurred.   ASSESSMENT:  Successful conversion to normal sinus rhythm.   PLAN:  Home later today.  Decrease amiodarone to 200 mg per day.  BMET in 1  week.  Office visit in 1 week.      Lyn Records, M.D.  Electronically Signed     HWS/MEDQ  D:  02/13/2006  T:  02/13/2006  Job:  [id number]   cc:   Stacie Acres. Cliffton Asters, M.D.  Fax: [phone]

## 2011-01-13 NOTE — Op Note (Signed)
NAMECOULTER, OLDAKER NO.:  [id number]   MEDICAL RECORD NO.:  [id number]          PATIENT TYPE:  INP   LOCATION:  2017                         FACILITY:  [facility]   PHYSICIAN:  Meade Maw, M.D.    DATE OF BIRTH:  Aug 13, 1930   DATE OF PROCEDURE:  10/30/2005  DATE OF DISCHARGE:                                 OPERATIVE REPORT   PROCEDURE PERFORMED:  Transesophageal echocardiogram and cardioversion.   INDICATIONS FOR PROCEDURE:  Atrial flutter, difficult to rate control.   DESCRIPTION OF PROCEDURE:  After obtaining written, informed consent and  after the patient being on amiodarone 400 mg by mouth twice daily for six  days and Lovenox subcu, the patient was brought to the endoscopy room.  Topical anesthesia was achieved using Cetacaine spray, viscous lidocaine,  the patient was given a total of Versed 5 mg IV, fentanyl 100 mcg IV and  Phenergan 12.5 mg IV. The patient was still somewhat awake at the time of  intubation.  The Omni-Plane probe was introduced with slight difficulty.  The patient most likely has some esophageal strictures.  Multiple views were  obtained at the midesophageal and basal level.  Deep gastric views were not  obtained secondary to probable distal esophageal strictures.  Color Flow  Doppler was performed across the mitral, tricuspid, aortic and pulmonic  valve.  Velocities were obtained over the left atrial appendage.   FINDINGS:  The patient had normal mitral morphology, had trivial mitral  regurgitation.  He was noted to have mild left atrial enlargement, normal LV  dimension with preserved systolic function.  He was noted to have minimal  spontaneous contrast in the left atrium.  There was no clot visualized in  the left atrial appendage.  He had normal aortic valve morphology, trivial  aortic insufficiency, grossly normal tricuspid valve, grossly normal right  atrium, right ventricle, grossly normal pulmonic valve, pericardium  revealing  no effusion. There were no clots identified. Therefore we  proceeded with cardioversion.  Anesthesia support was obtained; however,  anesthesia was not provided as the patient was appropriately sedated from  the previous  IV sedation.  Electrical cardioversion was performed using 150  joules synchronized.  The patient was successfully cardioverted into the  sinus bradycardia with a first degree AV block.   RECOMMENDATIONS:  The patient will continue with amiodarone at 200 mg daily  and further care as per Dr. Verdis Prime.      Meade Maw, M.D.  Electronically Signed     HP/MEDQ  D:  10/30/2005  T:  10/31/2005  Job:  [id number]

## 2011-01-13 NOTE — Op Note (Signed)
NAME:  [name], [name]                        ACCOUNT NO.:  [id number]   MEDICAL RECORD NO.:  [id number]                   PATIENT TYPE:  AMB   LOCATION:  ENDO                                 FACILITY:  [facility]   PHYSICIAN:  Graylin Shiver, M.D.                DATE OF BIRTH:  1930-02-09   DATE OF PROCEDURE:  04/30/2003  DATE OF DISCHARGE:                                 OPERATIVE REPORT   PROCEDURE:  Esophagogastroduodenoscopy with biopsy and endoscopic balloon  dilatation of a distal esophageal stricture.   INDICATION FOR PROCEDURE:  This patient is a 77 year old male, who has been  experiencing intermittent dysphagia to solid foods over the past 8-10 years.  He has also been experiencing substernal burning.  A recent barium  esophagram was done which showed a tight stricture at the GE junction that  did not allow passage of a 13 mm barium tablet.  There was also some  irregularity on the right side of the lower esophagus, felt probably to be  esophagitis.   Informed consent was obtained after explanation of the risks of bleeding,  infection, perforation, and possible need for emergent surgery.   PREMEDICATIONS:  1. Fentanyl 50 mcg IV.  2. Versed 5 mg IV.   DESCRIPTION OF PROCEDURE:  With the patient in the left lateral decubitus  position, the Olympus gastroscope was inserted into the oropharynx and  passed into the esophagus.  It was advanced down the esophagus to the level  of the esophagogastric junction.  There was a stricture encountered and just  above this stricture, there was a 2 cm linear ulcer present in the distal  esophagus.  This was photographed.  There appeared to be about a 5-6 mm area  of mucosa which appeared normal and not ulcerated between the actual  stricture and the beginning of the distal portion of this ulcer.  Biopsies  were obtained from the rim of the ulcer for histological inspection.  I then  tried to advance the scope beyond the stricture.  I  could get the scope tip  into the stricture but not beyond it into the stomach.  An endoscopic  balloon dilator was advanced down the scope and appropriately placed at the  level of the stricture.  It was inflated to 12 mm and held in place for one  minute.  I then inflated it to 13.5 mm and held it in place for one minute.  After this, the balloon was deflated and removed.  Endoscope was easily  passed into the stomach, then into the duodenum.  The second portion and  bulb of the duodenum looked normal.  The stomach had normal-appearing  mucosa.  No lesions were seen in the fundus or cardia.  The scope was then  straightened and brought back out.  The area of dilation looked good.  The  mid and proximal esophagus looked normal.  He tolerated  the procedure well  without immediate complications.   IMPRESSION:  1. Distal esophageal stricture located at 38 cm from the incisor teeth     region at the level of the gastroesophageal junction.  This was dilated     to 13.5 mm.  I chose not to dilate any further at this time because of     the ulcer that was located nearby.  2. Distal esophageal linear ulcer which was approximately 2 cm in length.     Biopsies were obtained.    PLAN:  1. The patient will remain on Prevacid 30 mg b.i.d.  2. I will see him back in the office in one month to see how he is doing     clinically.                                               Graylin Shiver, M.D.    Germain Osgood  D:  04/30/2003  T:  04/30/2003  Job:  [id number]   cc:   Stacie Acres. White, M.D.  [address]  [place]  [place] [address]  Fax: [phone]

## 2011-01-13 NOTE — H&P (Signed)
[name], [name] NO.:  [id number]   MEDICAL RECORD NO.:  [id number]          PATIENT TYPE:  INP   LOCATION:  2017                         FACILITY:  [facility]   PHYSICIAN:  Lyn Records, M.D.   DATE OF BIRTH:  Dec 12, 1929   DATE OF ADMISSION:  10/25/2005  DATE OF DISCHARGE:                                HISTORY & PHYSICAL   REASON FOR ADMISSION:  Atrial flutter.   HISTORY OF PRESENT ILLNESS:  [name] is a 77 year old male with history  of hypertension and has been on blood pressure medications for several  years.  In December of 2006, he states his blood pressure was good, however,  his heart rate was in the 40's/50's.  At that point his Atenolol was  decreased from 50 mg to 25 mg daily.  Soon after this decrease in his  Atenolol, he states that his heart rate went up to 140 and has been elevated  like that since that time.  He states he called his primary medical team and  was told that it was normal.  He then decided to see his primary physician  on October 25, 2005, and he was urgently sent to the emergency room.  He  denies any palpitations, PND, chest pain, presyncope, or syncope.  He is  asymptomatic.   PAST MEDICAL HISTORY:  Hypertension, bradycardia with higher Atenolol dose,  chronic left lower extremity edema secondary to football injury.   SOCIAL HISTORY:  He is married and lives in [place].  He has one child.  No tobacco, alcohol, or illicit drug use.   FAMILY HISTORY:  Mother died at age 27 of old age.  Father died at age 97 of  iron overload.   ALLERGIES:  No known drug allergies.   MEDICATIONS:  1.  Atenolol 25 mg a day.  2.  Benicar/hydrocortisone 40/25 one p.o. daily.  3.  Trental 400 mg b.i.d.  4.  Prilosec 20 mg a day.  5.  Hydrochlorothiazide/Triamterene one tablet daily.  6.  Baby aspirin 81 mg daily.   REVIEW OF SYSTEMS:  As above, otherwise negative.   PHYSICAL EXAMINATION:  VITAL SIGNS:  Temperature 97.0, pulse 130,  blood  pressure 144/110, respirations 20.  HEENT:  Grossly normal.  No carotid or subclavian bruits.  No JVD or  thyromegaly.  Sclerae clear.  Conjunctivae normal.  Nares without drainage.  CHEST:  Clear to auscultation bilaterally.  No wheezing or rhonchi.  HEART:  Tachycardia with no gross murmur.  He has a midsternal chest scar  secondary to a dermatological procedure/skin removal.  ABDOMEN:  Soft, nontender, and nondistended.  No masses and no bruits.  EXTREMITIES:  Lower extremities no femoral bruits.  He has a left anterior  knee scar secondary to a football injury that has resulted in left lower  extremity chronic swelling.  He has no edema in the right lower extremity.  NEUROLOGY:  Alert and oriented x3, he wears glasses.  SKIN:  Warm and dry.   LABORATORY DATA:  White blood cell count 7.4, hemoglobin 15.4, hematocrit  45, platelets 243, BUN 13,  creatinine 1.2, potassium 4.4.  Initial point of  care markers are negative.   Chest x-ray is not acute but does show cardiomegaly.   EKG; rate 130 (ventricular), right bundle branch block, underlying atrial  flutter, varying.   ASSESSMENT:  1.  Supraventricular tachycardia, probable atrial flutter with rapid      ventricular response.  2.  Hypertension.  3.  Chronic lower extremity edema.   The patient received IV Lopressor 5 mg x1 without any response.  He was  started on Cardizem with a 10 mg IV bolus and at a rate of 10 mg per hour.  This did help with his blood pressure, but his heart rate did not decrease.  He then was increased to 20 mg per hour.  The patient was then seen and  examined by Dr. Katrinka Blazing.  We are planning to have a two-dimensional  echocardiogram performed on the patient tomorrow and we will need to decide  if we need to go ahead and perform a T/DCCV prior to the patient's  discharge.  The patient will be admitted to the telemetry floor for further  monitoring.      Guy Franco, P.A.      Lyn Records, M.D.  Electronically Signed    LB/MEDQ  D:  10/26/2005  T:  10/26/2005  Job:  [id number]   cc:   Stacie Acres. Cliffton Asters, M.D.  Fax: [phone]

## 2011-01-13 NOTE — Discharge Summary (Signed)
NAMEPEARCE, LITTLEFIELD NO.:  [id number]   MEDICAL RECORD NO.:  [id number]          PATIENT TYPE:  INP   LOCATION:  2017                         FACILITY:  [facility]   PHYSICIAN:  [name], M.D.   DATE OF BIRTH:  02/25/1930   DATE OF ADMISSION:  10/25/2005  DATE OF DISCHARGE:  10/31/2005                                 DISCHARGE SUMMARY   DISCHARGE DIAGNOSES:  1.  Atrial flutter converted to normal sinus rhythm after cardioversion.  2.  Amiodarone load.  3.  Left ventricular ejection fraction estimated between 40% to 50%, with no      wall motion abnormalities.  4.  Mild asymmetric septal hypertrophy.  5.  Hypertension.  6.  History of bradycardia with atenolol.  7.  Chronic left lower extremity edema secondary to football injury.  8.  Long-term medication use.   HOSPITAL COURSE:  [name] is a 77 year old male patient who had a history  of hypertension. In December 2006, he stated that his blood pressure was  good; however, his heart rate was between 40 and 50 beats per minute.  Therefore, his atenolol was cut from 50 mg a day to 25 mg a day. In  December, after this change, he did note that his heart rate stayed up to  around 40 beats per minute and had been elevated that high since that time.  He was asymptomatic, but had noted that his pulse rate had been elevated. He  denied any palpitations, PND, chest pain, presyncope or syncope.   He was in his primary care physician's office on the date of admission and  was quickly referred to the emergency room. The patient was then admitted  and placed on IV Cardizem and, ultimately, had to be placed on IV amiodarone  for rate control. In addition, he was placed on IV heparin for blood  thinning, as well as a Coumadin load. Eventually, he got a cardioversion and  was quickly converted back to normal sinus rhythm.   Ultimately, his INR became therapeutic, and upon discharge his INR was 2.1.  He was then  converted over to oral amiodarone.   By October 31, 2005, he was ready for discharge to home on the following  medications.  1.  Amiodarone 200 mg p.o. b.i.d.  2.  Coumadin 5 mg daily.  3.  He was to stop his atenolol.  4.  He was to resume Prilosec daily.  5.  Benicar/HCTZ daily.  6.  Baby aspirin daily.  7.  Trental twice a day.   He was to return to Dr. Michaelle Roman office for a Coumadin check on November 03, 2005, at 3:15 p.m. He was to follow up to see Dr. Katrinka Roman on November 16, 2005,  at 2:00 p.m. He was to call for any problems, such as free bleeding,  palpitations or dizziness. We will give him a Coumadin diet instruction  sheet prior to discharge.   Other lab studies during the patient's hospital stay included a BUN of 19,  creatinine of 1.3, sodium 137, white count 7.7, hemoglobin of 15.3,  hematocrit  44, platelets 247. TSH 1.335. Cardiac isoenzymes negative. EKG on  October 31, 2005, showed normal sinus rhythm with a ventricular rate of 62,  interventricular conduction delay, early R-wave transition anteriorly, first  degree AV block.   The patient was discharged home in stable condition. He will follow up as  above instructed.      [name], P.A.      [name], M.D.  Electronically Signed    LB/MEDQ  D:  10/31/2005  T:  11/01/2005  Job:  [id number]   cc:   [name], M.D.  Fax: [phone]

## 2011-07-05 ENCOUNTER — Institutional Professional Consult (permissible substitution): Payer: Self-pay | Admitting: Internal Medicine

## 2013-05-26 ENCOUNTER — Encounter: Payer: Self-pay | Admitting: Interventional Cardiology

## 2013-05-26 ENCOUNTER — Encounter: Payer: Self-pay | Admitting: *Deleted

## 2013-05-27 DIAGNOSIS — E785 Hyperlipidemia, unspecified: Secondary | ICD-10-CM | POA: Insufficient documentation

## 2013-06-03 ENCOUNTER — Encounter: Payer: Self-pay | Admitting: Interventional Cardiology

## 2013-06-03 ENCOUNTER — Other Ambulatory Visit: Payer: Self-pay

## 2013-06-03 ENCOUNTER — Ambulatory Visit (INDEPENDENT_AMBULATORY_CARE_PROVIDER_SITE_OTHER): Payer: Medicare Other | Admitting: Interventional Cardiology

## 2013-06-03 VITALS — BP 140/88 | HR 117 | Ht 67.0 in | Wt 174.0 lb

## 2013-06-03 DIAGNOSIS — E785 Hyperlipidemia, unspecified: Secondary | ICD-10-CM

## 2013-06-03 DIAGNOSIS — Z7901 Long term (current) use of anticoagulants: Secondary | ICD-10-CM

## 2013-06-03 DIAGNOSIS — R634 Abnormal weight loss: Secondary | ICD-10-CM

## 2013-06-03 DIAGNOSIS — I4892 Unspecified atrial flutter: Secondary | ICD-10-CM

## 2013-06-03 DIAGNOSIS — I10 Essential (primary) hypertension: Secondary | ICD-10-CM

## 2013-06-03 MED ORDER — DILTIAZEM HCL 120 MG PO TABS: 120.0000 mg | ORAL_TABLET | Freq: Every day | ORAL | Status: DC

## 2013-06-03 MED ORDER — DILTIAZEM HCL ER COATED BEADS 120 MG PO CP24: 120.0000 mg | ORAL_CAPSULE | Freq: Every day | ORAL | Status: DC

## 2013-06-03 NOTE — Progress Notes (Signed)
Patient ID: [name], male   DOB: 1930/06/18, 79 y.o.   MRN: [id number]   PCP: Dr. Laurann Montana  HPI  Sy is doing well. He has no palpitations or chest discomfort. He's had no bleeding, related to Coumadin therapy. He denies chest discomfort, and orthopnea. He has chronic lower extremity swelling that improves by each morning. Physical activity has been stable. He lost 40 pounds during a bout of shingles. His weight is improving, but he is still down by 20 pounds.  No Known Allergies  Current Outpatient Prescriptions  Medication Sig Dispense Refill  . atorvastatin (LIPITOR) 20 MG tablet Take  1 tab daily      . cholecalciferol (VITAMIN D) 1000 UNITS tablet Take 1,000 Units by mouth daily.      Marland Kitchen diltiazem (CARDIZEM) 120 MG tablet Take 120 mg by mouth. Take Once daily      . metoprolol succinate (TOPROL-XL) 100 MG 24 hr tablet Take 100 mg by mouth daily. Take with or immediately following a meal.      . Multiple Vitamins-Minerals (MULTIVITAMIN WITH MINERALS) tablet Take 1 tablet by mouth daily.      Marland Kitchen olmesartan-hydrochlorothiazide (BENICAR HCT) 20-12.5 MG per tablet Take 1 tablet by mouth daily.      . Omega-3 Fatty Acids (FISH OIL) 1000 MG CAPS Take by mouth. Take one daily      . warfarin (COUMADIN) 5 MG tablet Take 5 mg by mouth daily.       No current facility-administered medications for this visit.    Past Medical History  Diagnosis Date  . Hypercholesteremia   . Atrial fibrillation   . GERD (gastroesophageal reflux disease)   . Atrial flutter   . HTN (hypertension)   . Hyperlipidemia   . Atrial flutter   . Edema 03/17/2009    Qualifier: Diagnosis of  By: Flonnie Overman      No past surgical history on file.  ROS: Shingles , and weight loss. Physical activity unchanged. Appetite is improving  PHYSICAL EXAM BP 140/88  Pulse 117  Ht 5\' 7"  (1.702 m)  Wt 174 lb (78.926 kg)  BMI 27.25 kg/m2  Appears healthy. Chest is clear. No murmur, rub, click, or  gallop. Trace ankle edema. No focal deficits.  EKG: Atrial flutter with rate of 117 beats per minute, left axis deviation, right bundle branch block.  ASSESSMENT AND PLAN   1. Atrial flutter. There is poor rate control today. Patient documents his heart rates at home and they have been under control. He is excited about being in the new office environment  2. Hypertension. Controlled.  3. Anticoagulation with Coumadin. He is followed in the Coumadin clinic  4. Weight loss. This is a concerning problem.

## 2013-06-03 NOTE — Patient Instructions (Addendum)
Your physician recommends that you continue on your current medications as directed. Please refer to the Current Medication list given to you today.  Your physician recommends that you schedule a follow-up appointment in: 1 YEAR WITH DR.SMITH  FOLLOW UP WITH YOU PRIMARY CARE DR. REGARDING YOUR WEIGHT LOSS  FOLLOW UP WITH JEREMY FOR YOUR COUMADIN

## 2013-06-30 ENCOUNTER — Ambulatory Visit (INDEPENDENT_AMBULATORY_CARE_PROVIDER_SITE_OTHER): Payer: Medicare Other | Admitting: Pharmacist

## 2013-06-30 DIAGNOSIS — I4891 Unspecified atrial fibrillation: Secondary | ICD-10-CM | POA: Insufficient documentation

## 2013-06-30 LAB — POCT INR: INR: 3.5

## 2013-07-14 ENCOUNTER — Ambulatory Visit (INDEPENDENT_AMBULATORY_CARE_PROVIDER_SITE_OTHER): Payer: Medicare Other | Admitting: *Deleted

## 2013-07-14 DIAGNOSIS — I4891 Unspecified atrial fibrillation: Secondary | ICD-10-CM

## 2013-07-28 ENCOUNTER — Telehealth: Payer: Self-pay | Admitting: Interventional Cardiology

## 2013-07-28 NOTE — Telephone Encounter (Signed)
New Message  Pt called// requests a call back from Jeremy// No further details// Please assist

## 2013-07-30 MED ORDER — OLMESARTAN MEDOXOMIL-HCTZ 20-12.5 MG PO TABS: 1.0000 | ORAL_TABLET | Freq: Every day | ORAL | Status: DC

## 2013-07-30 NOTE — Telephone Encounter (Signed)
Patient needed a refill on his Benicar / HCTZ sent to [facility].  Prescription sent.

## 2013-08-11 ENCOUNTER — Other Ambulatory Visit: Payer: Self-pay | Admitting: Gastroenterology

## 2013-08-11 DIAGNOSIS — R1310 Dysphagia, unspecified: Secondary | ICD-10-CM

## 2013-08-12 ENCOUNTER — Other Ambulatory Visit: Payer: Medicare Other

## 2013-08-12 ENCOUNTER — Ambulatory Visit
Admission: RE | Admit: 2013-08-12 | Discharge: 2013-08-12 | Disposition: A | Discharge status: Home / Self Care | Payer: Medicare Other | Source: Ambulatory Visit | Attending: Gastroenterology | Admitting: Gastroenterology

## 2013-08-12 DIAGNOSIS — R1310 Dysphagia, unspecified: Secondary | ICD-10-CM

## 2013-08-25 ENCOUNTER — Ambulatory Visit (INDEPENDENT_AMBULATORY_CARE_PROVIDER_SITE_OTHER): Payer: Medicare Other

## 2013-08-25 DIAGNOSIS — I4891 Unspecified atrial fibrillation: Secondary | ICD-10-CM

## 2013-08-25 LAB — POCT INR: INR: 1.3

## 2013-09-08 ENCOUNTER — Ambulatory Visit (INDEPENDENT_AMBULATORY_CARE_PROVIDER_SITE_OTHER): Payer: Medicare HMO | Admitting: Pharmacist

## 2013-09-08 DIAGNOSIS — I4891 Unspecified atrial fibrillation: Secondary | ICD-10-CM

## 2013-09-08 LAB — POCT INR: INR: 2

## 2013-09-18 ENCOUNTER — Other Ambulatory Visit: Payer: Self-pay

## 2013-09-18 MED ORDER — METOPROLOL SUCCINATE ER 100 MG PO TB24: 100.0000 mg | ORAL_TABLET | Freq: Every day | ORAL | Status: DC

## 2013-10-13 ENCOUNTER — Ambulatory Visit (INDEPENDENT_AMBULATORY_CARE_PROVIDER_SITE_OTHER): Payer: Medicare HMO | Admitting: Pharmacist

## 2013-10-13 DIAGNOSIS — I4891 Unspecified atrial fibrillation: Secondary | ICD-10-CM

## 2013-10-13 LAB — POCT INR: INR: 2.4

## 2013-11-24 ENCOUNTER — Ambulatory Visit (INDEPENDENT_AMBULATORY_CARE_PROVIDER_SITE_OTHER): Payer: Commercial Managed Care - HMO | Admitting: *Deleted

## 2013-11-24 DIAGNOSIS — I4891 Unspecified atrial fibrillation: Secondary | ICD-10-CM

## 2013-11-24 DIAGNOSIS — Z5181 Encounter for therapeutic drug level monitoring: Secondary | ICD-10-CM

## 2013-11-24 LAB — POCT INR: INR: 2.5

## 2013-12-24 ENCOUNTER — Ambulatory Visit (INDEPENDENT_AMBULATORY_CARE_PROVIDER_SITE_OTHER): Payer: Medicare HMO | Admitting: Pharmacist

## 2013-12-24 DIAGNOSIS — I4891 Unspecified atrial fibrillation: Secondary | ICD-10-CM

## 2013-12-24 DIAGNOSIS — Z5181 Encounter for therapeutic drug level monitoring: Secondary | ICD-10-CM

## 2013-12-24 LAB — POCT INR: INR: 2.3

## 2014-01-13 ENCOUNTER — Other Ambulatory Visit: Payer: Self-pay

## 2014-01-15 ENCOUNTER — Other Ambulatory Visit: Payer: Self-pay | Admitting: *Deleted

## 2014-01-15 DIAGNOSIS — Z79899 Other long term (current) drug therapy: Secondary | ICD-10-CM

## 2014-01-15 DIAGNOSIS — E7800 Pure hypercholesterolemia, unspecified: Secondary | ICD-10-CM

## 2014-01-21 ENCOUNTER — Other Ambulatory Visit: Payer: Commercial Managed Care - HMO

## 2014-01-21 ENCOUNTER — Ambulatory Visit (INDEPENDENT_AMBULATORY_CARE_PROVIDER_SITE_OTHER): Payer: Medicare HMO | Admitting: Pharmacist

## 2014-01-21 DIAGNOSIS — Z5181 Encounter for therapeutic drug level monitoring: Secondary | ICD-10-CM

## 2014-01-21 DIAGNOSIS — Z79899 Other long term (current) drug therapy: Secondary | ICD-10-CM

## 2014-01-21 DIAGNOSIS — I4891 Unspecified atrial fibrillation: Secondary | ICD-10-CM

## 2014-01-21 DIAGNOSIS — E7800 Pure hypercholesterolemia, unspecified: Secondary | ICD-10-CM

## 2014-01-21 LAB — POCT INR: INR: 2.7

## 2014-01-22 ENCOUNTER — Ambulatory Visit: Payer: Commercial Managed Care - HMO

## 2014-01-22 DIAGNOSIS — E7800 Pure hypercholesterolemia, unspecified: Secondary | ICD-10-CM

## 2014-01-22 LAB — HEPATIC FUNCTION PANEL
ALT: 26 U/L (ref 0–53)
AST: 33 U/L (ref 0–37)
Albumin: 4 g/dL (ref 3.5–5.2)
Alkaline Phosphatase: 74 U/L (ref 39–117)
BILIRUBIN DIRECT: 0.1 mg/dL (ref 0.0–0.3)
Total Bilirubin: 0.5 mg/dL (ref 0.2–1.2)
Total Protein: 6.7 g/dL (ref 6.0–8.3)

## 2014-01-22 LAB — LIPID PANEL
CHOL/HDL RATIO: 4
Cholesterol: 145 mg/dL (ref 0–200)
HDL: 36.3 mg/dL — ABNORMAL LOW (ref >39.00)
LDL CALC: 87 mg/dL (ref 0–99)
Triglycerides: 110 mg/dL (ref 0.0–149.0)
VLDL: 22 mg/dL (ref 0.0–40.0)

## 2014-02-02 ENCOUNTER — Telehealth: Payer: Self-pay

## 2014-02-02 NOTE — Telephone Encounter (Signed)
Message copied by Jarvis Newcomer on Mon Feb 02, 2014  9:55 AM ------      Message from: Verdis Prime      Created: Wed Jan 28, 2014  5:08 PM       Excellent. Recheck in 1 year ------

## 2014-02-02 NOTE — Telephone Encounter (Signed)
called to gibe pt lab results.lmtcb

## 2014-02-02 NOTE — Telephone Encounter (Signed)
Follow Up  ° °Pt returned call//SR  °

## 2014-02-03 NOTE — Telephone Encounter (Signed)
Message copied by Jarvis Newcomer on Tue Feb 03, 2014  4:42 PM ------      Message from: Verdis Prime      Created: Wed Jan 28, 2014  5:08 PM       Excellent. Recheck in 1 year ------

## 2014-02-03 NOTE — Telephone Encounter (Signed)
Pt aware of labs  

## 2014-02-03 NOTE — Telephone Encounter (Signed)
F/u ° ° °Pt returning your call °

## 2014-02-25 ENCOUNTER — Ambulatory Visit (INDEPENDENT_AMBULATORY_CARE_PROVIDER_SITE_OTHER): Payer: Commercial Managed Care - HMO | Admitting: *Deleted

## 2014-02-25 DIAGNOSIS — Z5181 Encounter for therapeutic drug level monitoring: Secondary | ICD-10-CM | POA: Diagnosis not present

## 2014-02-25 DIAGNOSIS — I4891 Unspecified atrial fibrillation: Secondary | ICD-10-CM | POA: Diagnosis not present

## 2014-02-25 LAB — POCT INR: INR: 2.1

## 2014-03-24 ENCOUNTER — Other Ambulatory Visit: Payer: Self-pay | Admitting: *Deleted

## 2014-03-24 MED ORDER — ATORVASTATIN CALCIUM 20 MG PO TABS: 20.0000 mg | ORAL_TABLET | Freq: Every day | ORAL | Status: DC

## 2014-04-08 ENCOUNTER — Ambulatory Visit (INDEPENDENT_AMBULATORY_CARE_PROVIDER_SITE_OTHER): Payer: Commercial Managed Care - HMO | Admitting: Pharmacist

## 2014-04-08 DIAGNOSIS — Z5181 Encounter for therapeutic drug level monitoring: Secondary | ICD-10-CM

## 2014-04-08 DIAGNOSIS — I4891 Unspecified atrial fibrillation: Secondary | ICD-10-CM

## 2014-04-08 LAB — POCT INR: INR: 2.1

## 2014-04-20 ENCOUNTER — Other Ambulatory Visit: Payer: Self-pay | Admitting: *Deleted

## 2014-04-20 MED ORDER — METOPROLOL SUCCINATE ER 100 MG PO TB24: 100.0000 mg | ORAL_TABLET | Freq: Every day | ORAL | Status: DC

## 2014-05-11 ENCOUNTER — Other Ambulatory Visit: Payer: Self-pay | Admitting: *Deleted

## 2014-05-11 MED ORDER — WARFARIN SODIUM 5 MG PO TABS: ORAL_TABLET | ORAL | Status: DC

## 2014-05-15 ENCOUNTER — Ambulatory Visit (INDEPENDENT_AMBULATORY_CARE_PROVIDER_SITE_OTHER): Payer: Commercial Managed Care - HMO | Admitting: Pharmacist

## 2014-05-15 DIAGNOSIS — I4891 Unspecified atrial fibrillation: Secondary | ICD-10-CM

## 2014-05-15 DIAGNOSIS — Z5181 Encounter for therapeutic drug level monitoring: Secondary | ICD-10-CM

## 2014-05-15 LAB — POCT INR: INR: 2.4

## 2014-05-15 MED ORDER — WARFARIN SODIUM 5 MG PO TABS: ORAL_TABLET | ORAL | Status: DC

## 2014-06-02 ENCOUNTER — Other Ambulatory Visit: Payer: Self-pay | Admitting: *Deleted

## 2014-06-02 MED ORDER — DILTIAZEM HCL ER COATED BEADS 120 MG PO CP24: 120.0000 mg | ORAL_CAPSULE | Freq: Every day | ORAL | Status: DC

## 2014-06-22 ENCOUNTER — Other Ambulatory Visit: Payer: Self-pay

## 2014-06-22 MED ORDER — METOPROLOL SUCCINATE ER 100 MG PO TB24
100.0000 mg | ORAL_TABLET | Freq: Every day | ORAL | Status: DC
Start: 2014-06-22 — End: 2015-06-22

## 2014-06-23 ENCOUNTER — Ambulatory Visit (INDEPENDENT_AMBULATORY_CARE_PROVIDER_SITE_OTHER): Payer: Commercial Managed Care - HMO | Admitting: Pharmacist Clinician (PhC)/ Clinical Pharmacy Specialist

## 2014-06-23 DIAGNOSIS — I4891 Unspecified atrial fibrillation: Secondary | ICD-10-CM

## 2014-06-23 DIAGNOSIS — Z5181 Encounter for therapeutic drug level monitoring: Secondary | ICD-10-CM

## 2014-06-23 LAB — POCT INR: INR: 2.1

## 2014-07-24 ENCOUNTER — Telehealth: Payer: Self-pay | Admitting: *Deleted

## 2014-07-24 NOTE — Telephone Encounter (Signed)
Pharmacy calling to let Office know Manufacture will change for Warfarin.

## 2014-07-29 ENCOUNTER — Other Ambulatory Visit: Payer: Self-pay | Admitting: *Deleted

## 2014-07-29 MED ORDER — ATORVASTATIN CALCIUM 20 MG PO TABS: 20.0000 mg | ORAL_TABLET | Freq: Every day | ORAL | Status: DC

## 2014-07-29 NOTE — Telephone Encounter (Signed)
Okay. Let the Coumadin clinic know.

## 2014-07-29 NOTE — Telephone Encounter (Signed)
FYI.fwd to the coumadin clininc

## 2014-07-30 ENCOUNTER — Ambulatory Visit (INDEPENDENT_AMBULATORY_CARE_PROVIDER_SITE_OTHER): Payer: Commercial Managed Care - HMO | Admitting: *Deleted

## 2014-07-30 DIAGNOSIS — I4891 Unspecified atrial fibrillation: Secondary | ICD-10-CM

## 2014-07-30 DIAGNOSIS — Z5181 Encounter for therapeutic drug level monitoring: Secondary | ICD-10-CM

## 2014-07-30 LAB — POCT INR: INR: 2

## 2014-08-07 ENCOUNTER — Ambulatory Visit: Payer: Commercial Managed Care - HMO | Admitting: Interventional Cardiology

## 2014-08-26 ENCOUNTER — Other Ambulatory Visit: Payer: Self-pay | Admitting: Interventional Cardiology

## 2014-08-27 ENCOUNTER — Other Ambulatory Visit: Payer: Self-pay | Admitting: Pharmacist

## 2014-08-27 DIAGNOSIS — I4892 Unspecified atrial flutter: Secondary | ICD-10-CM

## 2014-08-27 MED ORDER — DILTIAZEM HCL ER COATED BEADS 120 MG PO CP24: 120.0000 mg | ORAL_CAPSULE | Freq: Every day | ORAL | Status: DC

## 2014-09-08 ENCOUNTER — Encounter: Payer: Self-pay | Admitting: Interventional Cardiology

## 2014-09-08 ENCOUNTER — Ambulatory Visit (INDEPENDENT_AMBULATORY_CARE_PROVIDER_SITE_OTHER): Payer: Commercial Managed Care - HMO | Admitting: Interventional Cardiology

## 2014-09-08 ENCOUNTER — Ambulatory Visit (INDEPENDENT_AMBULATORY_CARE_PROVIDER_SITE_OTHER): Payer: Commercial Managed Care - HMO | Admitting: *Deleted

## 2014-09-08 VITALS — BP 160/100 | HR 112 | Ht 67.0 in | Wt 178.0 lb

## 2014-09-08 DIAGNOSIS — E785 Hyperlipidemia, unspecified: Secondary | ICD-10-CM

## 2014-09-08 DIAGNOSIS — I484 Atypical atrial flutter: Secondary | ICD-10-CM

## 2014-09-08 DIAGNOSIS — I10 Essential (primary) hypertension: Secondary | ICD-10-CM

## 2014-09-08 DIAGNOSIS — I4891 Unspecified atrial fibrillation: Secondary | ICD-10-CM

## 2014-09-08 DIAGNOSIS — I482 Chronic atrial fibrillation, unspecified: Secondary | ICD-10-CM

## 2014-09-08 DIAGNOSIS — I4510 Unspecified right bundle-branch block: Secondary | ICD-10-CM

## 2014-09-08 DIAGNOSIS — Z5181 Encounter for therapeutic drug level monitoring: Secondary | ICD-10-CM

## 2014-09-08 LAB — POCT INR: INR: 2.3

## 2014-09-08 NOTE — Patient Instructions (Signed)
Your physician recommends that you continue on your current medications as directed. Please refer to the Current Medication list given to you today. Your physician wants you to follow-up in: 1 year with Dr. Katrinka BlazingSmith.  You will receive a reminder letter in the mail two months in advance. If you don't receive a letter, please call our office to schedule the follow-up appointment.  Your physician recommends that you keep your follow-up appointmenst in: Coumadin Clinic.

## 2014-09-08 NOTE — Progress Notes (Signed)
Patient ID: [name], male   DOB: 1930-08-19, 81 y.o.   MRN: [id number]    [address] [place], [place]  [address] Phone: [phone] Fax:  [phone]  Date:  09/08/2014   ID:  [name], DOB Apr 30, 1930, MRN [id number]  PCP:  Cala Bradford, MD   ASSESSMENT:  1.  Atrial flutter with poor rate control asymptomatic  2. Hypertension with borderline control  3. Anticoagulation without complications  PLAN:  1.  Continue to monitor heart rate and blood pressure. They need to up titrate diltiazem to 240 mg per day  2. Clinical follow-up in one year  3. Call of dyspnea, lightheadedness, syncope, or edema   SUBJECTIVE: [name] is a 81 y.o. male  Who is doing well. He states he has no cardiopulmonary complaints. He specifically denies angina, palpitations, syncope, edema, and PND. No anginal quality chest pain. Compliance with medication is abdicated.   Wt Readings from Last 3 Encounters:  09/08/14 178 lb (80.74 kg)  06/03/13 174 lb (78.926 kg)  09/22/09 189 lb (85.73 kg)     Past Medical History  Diagnosis Date  . Hypercholesteremia   . Atrial fibrillation   . GERD (gastroesophageal reflux disease)   . Atrial flutter   . HTN (hypertension)   . Hyperlipidemia   . Atrial flutter   . Edema 03/17/2009    Qualifier: Diagnosis of  By: Flonnie Overman      Current Outpatient Prescriptions  Medication Sig Dispense Refill  . atorvastatin (LIPITOR) 20 MG tablet Take 1 tablet (20 mg total) by mouth daily. 30 tablet 1  . Calcium Carbonate-Vitamin D (CALCIUM 600+D3 PO) Take by mouth. TAKE ONE TABLET DAILY    . cholecalciferol (VITAMIN D) 1000 UNITS tablet Take 1,000 Units by mouth daily.    Marland Kitchen diltiazem (CARDIZEM CD) 120 MG 24 hr capsule Take 1 capsule (120 mg total) by mouth daily. 90 capsule 0  . metoprolol succinate (TOPROL-XL) 100 MG 24 hr tablet Take 1 tablet (100 mg total) by mouth daily. Take with or immediately following a meal. 90 tablet 3    . Multiple Vitamins-Minerals (MULTIVITAMIN WITH MINERALS) tablet Take 1 tablet by mouth daily.    Marland Kitchen olmesartan-hydrochlorothiazide (BENICAR HCT) 20-12.5 MG per tablet Take 1 tablet by mouth daily. 30 tablet 5  . Omega-3 Fatty Acids (FISH OIL) 1000 MG CAPS Take by mouth. Take one daily    . warfarin (COUMADIN) 5 MG tablet Take as directed by coumadin clinic 90 tablet 1   No current facility-administered medications for this visit.    Allergies:   No Known Allergies  Social History:  The patient  reports that he has never smoked. He does not have any smokeless tobacco history on file. He reports that he does not drink alcohol.   ROS:  Please see the history of present illness.    Appetite is stable. No blood in urine or stool.   All other systems reviewed and negative.   OBJECTIVE: VS:  BP 160/100 mmHg  Pulse 112  Ht  (1.702 m)  Wt 178 lb (80.74 kg)  BMI 27.87 kg/m2 Well nourished, well developed, in no acute distress,  elderly HEENT: normal Neck: JVD  flat. Carotid bruit  absent  Cardiac:  normal S1, S2;  Rapid and irregularRR; no murmur Lungs:  clear to auscultation bilaterally, no wheezing, rhonchi or rales Abd: soft, nontender, no hepatomegaly Ext: Edema  Trace bilateral ankle edema. Pulses  Trace 1+  bilateral Skin: warm and dry Neuro:  CNs 2-12 intact, no focal abnormalities noted  EKG:   Atrial fibrillation /flutter with variable ventricular response at approximately 100 bpm       Signed, Darci NeedleHenry W. B. Smith III, MD 09/08/2014 3:40 PM

## 2014-10-16 ENCOUNTER — Ambulatory Visit (INDEPENDENT_AMBULATORY_CARE_PROVIDER_SITE_OTHER): Payer: Commercial Managed Care - HMO | Admitting: *Deleted

## 2014-10-16 DIAGNOSIS — Z5181 Encounter for therapeutic drug level monitoring: Secondary | ICD-10-CM

## 2014-10-16 DIAGNOSIS — I4891 Unspecified atrial fibrillation: Secondary | ICD-10-CM

## 2014-10-16 DIAGNOSIS — I482 Chronic atrial fibrillation, unspecified: Secondary | ICD-10-CM

## 2014-10-16 LAB — POCT INR: INR: 2.2

## 2014-11-27 ENCOUNTER — Ambulatory Visit (INDEPENDENT_AMBULATORY_CARE_PROVIDER_SITE_OTHER): Payer: Commercial Managed Care - HMO

## 2014-11-27 DIAGNOSIS — Z5181 Encounter for therapeutic drug level monitoring: Secondary | ICD-10-CM

## 2014-11-27 DIAGNOSIS — I4891 Unspecified atrial fibrillation: Secondary | ICD-10-CM | POA: Diagnosis not present

## 2014-11-27 LAB — POCT INR: INR: 2.1

## 2014-11-27 MED ORDER — DILTIAZEM HCL ER COATED BEADS 120 MG PO CP24: 120.0000 mg | ORAL_CAPSULE | Freq: Every day | ORAL | Status: DC

## 2014-12-01 ENCOUNTER — Other Ambulatory Visit: Payer: Self-pay | Admitting: Interventional Cardiology

## 2015-01-08 ENCOUNTER — Ambulatory Visit (INDEPENDENT_AMBULATORY_CARE_PROVIDER_SITE_OTHER): Payer: Commercial Managed Care - HMO | Admitting: Pharmacist

## 2015-01-08 DIAGNOSIS — I4891 Unspecified atrial fibrillation: Secondary | ICD-10-CM

## 2015-01-08 DIAGNOSIS — Z5181 Encounter for therapeutic drug level monitoring: Secondary | ICD-10-CM

## 2015-01-08 LAB — POCT INR: INR: 2.6

## 2015-02-05 ENCOUNTER — Ambulatory Visit (INDEPENDENT_AMBULATORY_CARE_PROVIDER_SITE_OTHER): Payer: Commercial Managed Care - HMO | Admitting: *Deleted

## 2015-02-05 DIAGNOSIS — Z5181 Encounter for therapeutic drug level monitoring: Secondary | ICD-10-CM | POA: Diagnosis not present

## 2015-02-05 DIAGNOSIS — I4891 Unspecified atrial fibrillation: Secondary | ICD-10-CM | POA: Diagnosis not present

## 2015-02-05 LAB — POCT INR: INR: 2

## 2015-03-19 ENCOUNTER — Ambulatory Visit (INDEPENDENT_AMBULATORY_CARE_PROVIDER_SITE_OTHER): Payer: Commercial Managed Care - HMO

## 2015-03-19 DIAGNOSIS — I4891 Unspecified atrial fibrillation: Secondary | ICD-10-CM

## 2015-03-19 DIAGNOSIS — Z5181 Encounter for therapeutic drug level monitoring: Secondary | ICD-10-CM

## 2015-03-19 LAB — POCT INR: INR: 2.1

## 2015-04-23 ENCOUNTER — Ambulatory Visit (INDEPENDENT_AMBULATORY_CARE_PROVIDER_SITE_OTHER): Payer: Commercial Managed Care - HMO | Admitting: Pharmacist

## 2015-04-23 DIAGNOSIS — Z5181 Encounter for therapeutic drug level monitoring: Secondary | ICD-10-CM | POA: Diagnosis not present

## 2015-04-23 DIAGNOSIS — I4891 Unspecified atrial fibrillation: Secondary | ICD-10-CM | POA: Diagnosis not present

## 2015-04-23 LAB — POCT INR: INR: 2

## 2015-05-28 ENCOUNTER — Other Ambulatory Visit: Payer: Self-pay | Admitting: Interventional Cardiology

## 2015-05-28 ENCOUNTER — Ambulatory Visit (INDEPENDENT_AMBULATORY_CARE_PROVIDER_SITE_OTHER): Payer: Commercial Managed Care - HMO | Admitting: Pharmacist

## 2015-05-28 DIAGNOSIS — Z5181 Encounter for therapeutic drug level monitoring: Secondary | ICD-10-CM | POA: Diagnosis not present

## 2015-05-28 DIAGNOSIS — I4891 Unspecified atrial fibrillation: Secondary | ICD-10-CM

## 2015-05-28 LAB — POCT INR: INR: 1.9

## 2015-05-28 MED ORDER — WARFARIN SODIUM 5 MG PO TABS: ORAL_TABLET | ORAL | Status: DC

## 2015-06-22 ENCOUNTER — Other Ambulatory Visit: Payer: Self-pay | Admitting: Interventional Cardiology

## 2015-07-13 ENCOUNTER — Ambulatory Visit (INDEPENDENT_AMBULATORY_CARE_PROVIDER_SITE_OTHER): Payer: Commercial Managed Care - HMO | Admitting: *Deleted

## 2015-07-13 DIAGNOSIS — I4891 Unspecified atrial fibrillation: Secondary | ICD-10-CM | POA: Diagnosis not present

## 2015-07-13 DIAGNOSIS — Z5181 Encounter for therapeutic drug level monitoring: Secondary | ICD-10-CM

## 2015-07-13 LAB — POCT INR: INR: 2

## 2015-08-13 ENCOUNTER — Ambulatory Visit (INDEPENDENT_AMBULATORY_CARE_PROVIDER_SITE_OTHER): Payer: Commercial Managed Care - HMO | Admitting: *Deleted

## 2015-08-13 DIAGNOSIS — Z5181 Encounter for therapeutic drug level monitoring: Secondary | ICD-10-CM | POA: Diagnosis not present

## 2015-08-13 DIAGNOSIS — I4891 Unspecified atrial fibrillation: Secondary | ICD-10-CM

## 2015-08-13 LAB — POCT INR: INR: 1.9

## 2015-09-06 ENCOUNTER — Ambulatory Visit: Payer: Commercial Managed Care - HMO | Admitting: Interventional Cardiology

## 2015-09-08 ENCOUNTER — Other Ambulatory Visit: Payer: Self-pay | Admitting: Interventional Cardiology

## 2015-09-08 MED ORDER — DILTIAZEM HCL ER COATED BEADS 120 MG PO CP24: 120.0000 mg | ORAL_CAPSULE | Freq: Every day | ORAL | Status: DC

## 2015-09-09 ENCOUNTER — Ambulatory Visit (INDEPENDENT_AMBULATORY_CARE_PROVIDER_SITE_OTHER): Payer: Commercial Managed Care - HMO | Admitting: *Deleted

## 2015-09-09 DIAGNOSIS — Z5181 Encounter for therapeutic drug level monitoring: Secondary | ICD-10-CM

## 2015-09-09 DIAGNOSIS — I4891 Unspecified atrial fibrillation: Secondary | ICD-10-CM | POA: Diagnosis not present

## 2015-09-09 LAB — POCT INR: INR: 1.9

## 2015-09-30 ENCOUNTER — Ambulatory Visit (INDEPENDENT_AMBULATORY_CARE_PROVIDER_SITE_OTHER): Payer: Commercial Managed Care - HMO | Admitting: *Deleted

## 2015-09-30 DIAGNOSIS — I4891 Unspecified atrial fibrillation: Secondary | ICD-10-CM

## 2015-09-30 DIAGNOSIS — Z5181 Encounter for therapeutic drug level monitoring: Secondary | ICD-10-CM

## 2015-09-30 LAB — POCT INR: INR: 2

## 2015-11-01 ENCOUNTER — Ambulatory Visit (INDEPENDENT_AMBULATORY_CARE_PROVIDER_SITE_OTHER): Payer: Commercial Managed Care - HMO | Admitting: Interventional Cardiology

## 2015-11-01 ENCOUNTER — Ambulatory Visit (INDEPENDENT_AMBULATORY_CARE_PROVIDER_SITE_OTHER): Payer: Commercial Managed Care - HMO | Admitting: *Deleted

## 2015-11-01 ENCOUNTER — Encounter: Payer: Self-pay | Admitting: Interventional Cardiology

## 2015-11-01 VITALS — BP 128/86 | HR 127 | Ht 70.0 in | Wt 167.8 lb

## 2015-11-01 DIAGNOSIS — I10 Essential (primary) hypertension: Secondary | ICD-10-CM

## 2015-11-01 DIAGNOSIS — I482 Chronic atrial fibrillation, unspecified: Secondary | ICD-10-CM

## 2015-11-01 DIAGNOSIS — I4510 Unspecified right bundle-branch block: Secondary | ICD-10-CM | POA: Diagnosis not present

## 2015-11-01 DIAGNOSIS — E785 Hyperlipidemia, unspecified: Secondary | ICD-10-CM | POA: Diagnosis not present

## 2015-11-01 DIAGNOSIS — I4891 Unspecified atrial fibrillation: Secondary | ICD-10-CM | POA: Diagnosis not present

## 2015-11-01 DIAGNOSIS — Z5181 Encounter for therapeutic drug level monitoring: Secondary | ICD-10-CM

## 2015-11-01 LAB — POCT INR: INR: 2.3

## 2015-11-01 NOTE — Patient Instructions (Signed)
Medication Instructions:  Your physician recommends that you continue on your current medications as directed. Please refer to the Current Medication list given to you today.   Labwork: None ordered  Testing/Procedures: Your physician has recommended that you wear a holter monitor. Holter monitors are medical devices that record the heart's electrical activity. Doctors most often use these monitors to diagnose arrhythmias. Arrhythmias are problems with the speed or rhythm of the heartbeat. The monitor is a small, portable device. You can wear one while you do your normal daily activities. This is usually used to diagnose what is causing palpitations/syncope (passing out). (24 Hour)  Follow-Up: Your physician wants you to follow-up in: 1 year with Dr.Smith You will receive a reminder letter in the mail two months in advance. If you don't receive a letter, please call our office to schedule the follow-up appointment.   Any Other Special Instructions Will Be Listed Below (If Applicable).     If you need a refill on your cardiac medications before your next appointment, please call your pharmacy.

## 2015-11-01 NOTE — Progress Notes (Signed)
Cardiology Office Note   Date:  11/01/2015   ID:  [name], DOB 29-Feb-1930, MRN [id number]  PCP:  Cala Bradford, MD  Cardiologist:  Lesleigh Noe, MD   Chief Complaint  Patient presents with  . Atrial Flutter      History of Present Illness: [name] is a 82 y.o. male who presents for Chronic atrial fibrillation/flutter, hypertension, chronic diastolic heart failure, hyperlipidemia, and chronic anticoagulation therapy.  Patient has no complaints. We have concern about increased heart rate. He states that heart rates are well controlled at home. No recent monitor. He denies orthopnea, PND, syncope, chest pain, and edema.    Past Medical History  Diagnosis Date  . Hypercholesteremia   . Atrial fibrillation (HCC)   . GERD (gastroesophageal reflux disease)   . Atrial flutter (HCC)   . HTN (hypertension)   . Hyperlipidemia   . Atrial flutter (HCC)   . Edema 03/17/2009    Qualifier: Diagnosis of  By: Flonnie Overman      No past surgical history on file.   Current Outpatient Prescriptions  Medication Sig Dispense Refill  . atorvastatin (LIPITOR) 20 MG tablet TAKE ONE TABLET BY MOUTH ONCE DAILY 30 tablet 8  . Calcium Carbonate-Vitamin D (CALCIUM 600+D3 PO) Take by mouth. TAKE ONE TABLET DAILY    . cholecalciferol (VITAMIN D) 1000 UNITS tablet Take 1,000 Units by mouth daily.    Marland Kitchen diltiazem (CARDIZEM CD) 120 MG 24 hr capsule Take 1 capsule (120 mg total) by mouth daily. 90 capsule 2  . metoprolol succinate (TOPROL-XL) 100 MG 24 hr tablet TAKE ONE TABLET BY MOUTH ONCE DAILY WITH  OR  IMEDIATELY  FOLLOWING  A  MEAL 90 tablet 1  . Multiple Vitamins-Minerals (MULTIVITAMIN WITH MINERALS) tablet Take 1 tablet by mouth daily.    Marland Kitchen olmesartan-hydrochlorothiazide (BENICAR HCT) 20-12.5 MG per tablet Take 1 tablet by mouth daily. 30 tablet 5  . Omega-3 Fatty Acids (FISH OIL) 1000 MG CAPS Take by mouth. Take one daily    . warfarin (COUMADIN) 5 MG tablet Take as  directed by Coumadin clinic 90 tablet 1   No current facility-administered medications for this visit.    Allergies:   Review of patient's allergies indicates no known allergies.    Social History:  The patient  reports that he has never smoked. He has never used smokeless tobacco. He reports that he does not drink alcohol.   Family History:  The patient's family history includes Healthy in his mother and sister; Other in his father.    ROS:  Please see the history of present illness.   Otherwise, review of systems are positive for decreased energy, difficulty with balance, easy bruising of skin, and difficulty sleeping..   All other systems are reviewed and negative.    PHYSICAL EXAM: VS:  BP 128/86 mmHg  Pulse 127  Ht  (1.778 m)  Wt 167 lb 12.8 oz (76.114 kg)  BMI 24.08 kg/m2 , BMI Body mass index is 24.08 kg/(m^2). GEN: Well nourished, well developed, in no acute distress HEENT: normal Neck: no JVD, carotid bruits, or masses Cardiac: IIRR and rapid rate.  There is no murmur, rub, or gallop. There is no edema. Respiratory:  clear to auscultation bilaterally, normal work of breathing. GI: soft, nontender, nondistended, + BS MS: no deformity or atrophy Skin: warm and dry, no rash Neuro:  Strength and sensation are intact Psych: euthymic mood, full affect   EKG:  EKG is  ordered today. The ekg reveals atrial fibrillation with rapid ventricular response, incomplete right bundle, Paul wave progression, and nonspecific ST abnormality. Compared to the prior EKG the heart rate is faster.   Recent Labs: No results found for requested labs within last 365 days.    Lipid Panel    Component Value Date/Time   CHOL 145 01/22/2014 1214   TRIG 110.0 01/22/2014 1214   HDL 36.30* 01/22/2014 1214   CHOLHDL 4 01/22/2014 1214   VLDL 22.0 01/22/2014 1214   LDLCALC 87 01/22/2014 1214      Wt Readings from Last 3 Encounters:  11/01/15 167 lb 12.8 oz (76.114 kg)  09/08/14 178 lb  (80.74 kg)  06/03/13 174 lb (78.926 kg)      Other studies Reviewed: Additional studies/ records that were reviewed today include: Old EKGs, Holter monitors, and echocardiograms.. The findings include known normal LV function. Chronic poor rate control. The patient refused ablation or was not considered a good candidate for ablation in the past..    ASSESSMENT AND PLAN:  1. Chronic atrial fibrillation (HCC) Chronic atrial flutter/fibrillation with poor rate control to day. I'm not sure why as the patient is on beta blocker and diltiazem therapy.. Patient states that his heart race at home run between 50 and 70 bpm. I believe his device is not picking up all of his cardiac activity. I tried to explain this to the patient but he has difficulty comprehending the concept. - EKG 12-Lead - Holter monitor - 24 hour; Future  2. Right bundle branch block Unchanged - EKG 12-Lead  3. Hyperlipidemia On therapy  4. Essential hypertension Controlled - EKG 12-Lead    Current medicines are reviewed at length with the patient today.  The patient has the following concerns regarding medicines: No changes.  The following changes/actions have been instituted:    24 Holter to establish average heart rate and if rate control is an issue, I will send him back to the EP clinic for further management suggestions.  Follow-up will be dependent upon findings of Holter monitor. An early May need to up titrate medical regimen.  Labs/ tests ordered today include:  Orders Placed This Encounter  Procedures  . Holter monitor - 24 hour  . EKG 12-Lead     Disposition:   FU with HS in 6 months  Signed, Lesleigh NoeSMITH III,Rissa Turley W, MD  11/01/2015 11:17 AM    [facility] [address], [place], [place]  [address] Phone: [phone]; Fax: [phone]

## 2015-11-30 ENCOUNTER — Ambulatory Visit (INDEPENDENT_AMBULATORY_CARE_PROVIDER_SITE_OTHER): Payer: Commercial Managed Care - HMO | Admitting: *Deleted

## 2015-11-30 DIAGNOSIS — I482 Chronic atrial fibrillation, unspecified: Secondary | ICD-10-CM

## 2015-11-30 DIAGNOSIS — I4891 Unspecified atrial fibrillation: Secondary | ICD-10-CM | POA: Diagnosis not present

## 2015-11-30 DIAGNOSIS — Z5181 Encounter for therapeutic drug level monitoring: Secondary | ICD-10-CM

## 2015-11-30 LAB — POCT INR: INR: 2.3

## 2015-12-06 ENCOUNTER — Other Ambulatory Visit: Payer: Self-pay | Admitting: Interventional Cardiology

## 2015-12-10 ENCOUNTER — Other Ambulatory Visit: Payer: Self-pay | Admitting: Interventional Cardiology

## 2015-12-10 MED ORDER — DILTIAZEM HCL ER COATED BEADS 120 MG PO CP24: 120.0000 mg | ORAL_CAPSULE | Freq: Every day | ORAL | Status: DC

## 2015-12-17 ENCOUNTER — Other Ambulatory Visit: Payer: Self-pay | Admitting: Interventional Cardiology

## 2016-01-11 ENCOUNTER — Ambulatory Visit (INDEPENDENT_AMBULATORY_CARE_PROVIDER_SITE_OTHER): Payer: Commercial Managed Care - HMO | Admitting: *Deleted

## 2016-01-11 DIAGNOSIS — I482 Chronic atrial fibrillation, unspecified: Secondary | ICD-10-CM

## 2016-01-11 DIAGNOSIS — I4891 Unspecified atrial fibrillation: Secondary | ICD-10-CM

## 2016-01-11 DIAGNOSIS — Z5181 Encounter for therapeutic drug level monitoring: Secondary | ICD-10-CM

## 2016-01-11 LAB — POCT INR: INR: 2.4

## 2016-02-22 ENCOUNTER — Ambulatory Visit (INDEPENDENT_AMBULATORY_CARE_PROVIDER_SITE_OTHER): Payer: Commercial Managed Care - HMO | Admitting: *Deleted

## 2016-02-22 DIAGNOSIS — Z5181 Encounter for therapeutic drug level monitoring: Secondary | ICD-10-CM | POA: Diagnosis not present

## 2016-02-22 DIAGNOSIS — I4891 Unspecified atrial fibrillation: Secondary | ICD-10-CM

## 2016-02-22 LAB — POCT INR: INR: 1.8

## 2016-03-07 ENCOUNTER — Ambulatory Visit (INDEPENDENT_AMBULATORY_CARE_PROVIDER_SITE_OTHER): Payer: Commercial Managed Care - HMO | Admitting: *Deleted

## 2016-03-07 DIAGNOSIS — I4891 Unspecified atrial fibrillation: Secondary | ICD-10-CM

## 2016-03-07 DIAGNOSIS — Z5181 Encounter for therapeutic drug level monitoring: Secondary | ICD-10-CM

## 2016-03-07 LAB — POCT INR: INR: 2.3

## 2016-03-20 ENCOUNTER — Other Ambulatory Visit: Payer: Self-pay | Admitting: Interventional Cardiology

## 2016-03-28 ENCOUNTER — Ambulatory Visit (INDEPENDENT_AMBULATORY_CARE_PROVIDER_SITE_OTHER): Payer: Commercial Managed Care - HMO | Admitting: *Deleted

## 2016-03-28 DIAGNOSIS — Z5181 Encounter for therapeutic drug level monitoring: Secondary | ICD-10-CM | POA: Diagnosis not present

## 2016-03-28 DIAGNOSIS — I4891 Unspecified atrial fibrillation: Secondary | ICD-10-CM | POA: Diagnosis not present

## 2016-03-28 LAB — POCT INR: INR: 2.4

## 2016-04-11 ENCOUNTER — Encounter (INDEPENDENT_AMBULATORY_CARE_PROVIDER_SITE_OTHER): Payer: Self-pay

## 2016-04-11 ENCOUNTER — Ambulatory Visit (INDEPENDENT_AMBULATORY_CARE_PROVIDER_SITE_OTHER): Payer: Commercial Managed Care - HMO | Admitting: Pharmacist

## 2016-04-11 DIAGNOSIS — Z5181 Encounter for therapeutic drug level monitoring: Secondary | ICD-10-CM | POA: Diagnosis not present

## 2016-04-11 DIAGNOSIS — I4891 Unspecified atrial fibrillation: Secondary | ICD-10-CM

## 2016-04-11 LAB — POCT INR: INR: 2.8

## 2016-05-19 ENCOUNTER — Ambulatory Visit (INDEPENDENT_AMBULATORY_CARE_PROVIDER_SITE_OTHER): Payer: Commercial Managed Care - HMO | Admitting: *Deleted

## 2016-05-19 DIAGNOSIS — Z5181 Encounter for therapeutic drug level monitoring: Secondary | ICD-10-CM

## 2016-05-19 DIAGNOSIS — I4891 Unspecified atrial fibrillation: Secondary | ICD-10-CM | POA: Diagnosis not present

## 2016-05-19 LAB — POCT INR: INR: 2.2

## 2016-05-23 ENCOUNTER — Telehealth: Payer: Self-pay

## 2016-05-23 MED ORDER — LOSARTAN POTASSIUM-HCTZ 100-12.5 MG PO TABS: 1.0000 | ORAL_TABLET | Freq: Every day | ORAL | 2 refills | Status: DC

## 2016-05-23 NOTE — Telephone Encounter (Signed)
Pt aware of Dr.Smith's recommendation. He can complete his Benicar supply then start   losartan HCT, 100/12.5 mg daily, Rx sent to pt pharmacy. Pt voiced appreciation and verbalized understanding.

## 2016-05-23 NOTE — Telephone Encounter (Signed)
Changed to losartan HCT, 100/12.5 mg daily

## 2016-05-23 NOTE — Addendum Note (Signed)
Addended by: Jarvis NewcomerPARRIS-GODLEY, LISA S on: 05/23/2016 04:18 PM   Modules accepted: Orders

## 2016-05-23 NOTE — Telephone Encounter (Signed)
Patients sts that Benicar is too expensive and he would like a cheaper alternative. We were regularly supplying him with samples. We have not had any samples available for some time. He cannot rely on us supplying him with samples, and will need an Rx for something more affordable. Adv pt that I will send the update to Dr.Smith and  Call the pt with his recommendation. Pt agreeable with plan and verbalized understanding.

## 2016-06-02 ENCOUNTER — Ambulatory Visit (INDEPENDENT_AMBULATORY_CARE_PROVIDER_SITE_OTHER): Payer: Commercial Managed Care - HMO | Admitting: *Deleted

## 2016-06-02 DIAGNOSIS — Z23 Encounter for immunization: Secondary | ICD-10-CM | POA: Diagnosis not present

## 2016-06-30 ENCOUNTER — Ambulatory Visit (INDEPENDENT_AMBULATORY_CARE_PROVIDER_SITE_OTHER): Payer: Commercial Managed Care - HMO | Admitting: *Deleted

## 2016-06-30 DIAGNOSIS — Z5181 Encounter for therapeutic drug level monitoring: Secondary | ICD-10-CM

## 2016-06-30 DIAGNOSIS — I4891 Unspecified atrial fibrillation: Secondary | ICD-10-CM

## 2016-06-30 LAB — POCT INR: INR: 2.4

## 2016-08-04 ENCOUNTER — Ambulatory Visit (INDEPENDENT_AMBULATORY_CARE_PROVIDER_SITE_OTHER): Payer: Commercial Managed Care - HMO | Admitting: *Deleted

## 2016-08-04 DIAGNOSIS — I4891 Unspecified atrial fibrillation: Secondary | ICD-10-CM | POA: Diagnosis not present

## 2016-08-04 DIAGNOSIS — Z5181 Encounter for therapeutic drug level monitoring: Secondary | ICD-10-CM | POA: Diagnosis not present

## 2016-08-04 LAB — POCT INR: INR: 2.6

## 2016-09-18 ENCOUNTER — Ambulatory Visit (INDEPENDENT_AMBULATORY_CARE_PROVIDER_SITE_OTHER): Payer: Medicare HMO | Admitting: *Deleted

## 2016-09-18 DIAGNOSIS — I4891 Unspecified atrial fibrillation: Secondary | ICD-10-CM | POA: Diagnosis not present

## 2016-09-18 DIAGNOSIS — Z5181 Encounter for therapeutic drug level monitoring: Secondary | ICD-10-CM | POA: Diagnosis not present

## 2016-09-18 LAB — POCT INR: INR: 2.5

## 2016-10-30 ENCOUNTER — Ambulatory Visit (INDEPENDENT_AMBULATORY_CARE_PROVIDER_SITE_OTHER): Payer: Medicare HMO | Admitting: *Deleted

## 2016-10-30 DIAGNOSIS — I4891 Unspecified atrial fibrillation: Secondary | ICD-10-CM

## 2016-10-30 DIAGNOSIS — Z5181 Encounter for therapeutic drug level monitoring: Secondary | ICD-10-CM

## 2016-10-30 LAB — POCT INR: INR: 3

## 2016-11-09 ENCOUNTER — Encounter: Payer: Self-pay | Admitting: Interventional Cardiology

## 2016-11-19 DIAGNOSIS — Z7901 Long term (current) use of anticoagulants: Secondary | ICD-10-CM | POA: Insufficient documentation

## 2016-11-19 NOTE — Progress Notes (Signed)
Cardiology Office Note    Date:  11/20/2016   ID:  [name], DOB Aug 13, 1930, MRN [id number]  PCP:  Cala BradfordWHITE,CYNTHIA S, MD  Cardiologist: Lesleigh NoeHenry W Jahquez Steffler III, MD   Chief Complaint  Patient presents with  . Atrial Fibrillation    History of Present Illness:  [name] is a 83 y.o. male  presents for f/u chronic atrial fibrillation/flutter, hypertension, chronic diastolic heart failure, hyperlipidemia, and chronic anticoagulation therapy.  No complaints of his personal condition. Spent most of the visit talking about the co-pay for his wife's medication regimen. He has not had syncope. Denies orthopnea, PND, lower extremity swelling, and chest pain. He is active. States when he does his blood pressures at home there usually less than 110 mmHg.  Past Medical History:  Diagnosis Date  . Atrial fibrillation (HCC)   . Atrial flutter (HCC)   . Atrial flutter (HCC)   . Edema 03/17/2009   Qualifier: Diagnosis of  By: Flonnie OvermanMcLean, Monica    . GERD (gastroesophageal reflux disease)   . HTN (hypertension)   . Hypercholesteremia   . Hyperlipidemia     No past surgical history on file.  Current Medications: Outpatient Medications Prior to Visit  Medication Sig Dispense Refill  . atorvastatin (LIPITOR) 20 MG tablet TAKE ONE TABLET BY MOUTH ONCE DAILY 30 tablet 11  . Calcium Carbonate-Vitamin D (CALCIUM 600+D3 PO) Take by mouth. TAKE ONE TABLET DAILY    . cholecalciferol (VITAMIN D) 1000 UNITS tablet Take 1,000 Units by mouth daily.    Marland Kitchen. diltiazem (CARDIZEM CD) 120 MG 24 hr capsule Take 1 capsule (120 mg total) by mouth daily. 90 capsule 3  . losartan-hydrochlorothiazide (HYZAAR) 100-12.5 MG tablet Take 1 tablet by mouth daily. 90 tablet 2  . metoprolol succinate (TOPROL-XL) 100 MG 24 hr tablet TAKE ONE TABLET BY MOUTH ONCE DAILY WITH  OR  IMMEDIATELY  FOLLOWING  A  MEAL 90 tablet 3  . Multiple Vitamins-Minerals (MULTIVITAMIN WITH MINERALS) tablet Take 1 tablet by mouth daily.    .  Omega-3 Fatty Acids (FISH OIL) 1000 MG CAPS Take by mouth. Take one daily    . warfarin (COUMADIN) 5 MG tablet TAKE AS DIRECTED BY  COUMADIN  CLINIC 90 tablet 1   No facility-administered medications prior to visit.      Allergies:   Patient has no known allergies.   Social History   Social History  . Marital status: Married    Spouse name: N/A  . Number of children: N/A  . Years of education: N/A   Social History Main Topics  . Smoking status: Never Smoker  . Smokeless tobacco: Never Used  . Alcohol use No  . Drug use: No  . Sexual activity: Yes   Other Topics Concern  . None   Social History Narrative  . None     Family History:  The patient's family history includes Healthy in his mother and sister; Other in his father.   ROS:   Please see the history of present illness.    Occasional dizziness. Easy bruising. Irregular heartbeat. No episodes of syncope.  All other systems reviewed and are negative.   PHYSICAL EXAM:   VS:  BP (!) 160/88 (BP Location: Left Arm)   Pulse (!) 53   Ht 5\' 10"  (1.778 m)   Wt 172 lb (78 kg)   BMI 24.68 kg/m    GEN: Well nourished, well developed, in no acute distress  HEENT: normal  Neck: no JVD,  carotid bruits, or masses Cardiac: RRR; no murmurs, rubs, or gallops,no edema  Respiratory:  clear to auscultation bilaterally, normal work of breathing GI: soft, nontender, nondistended, + BS MS: no deformity or atrophy  Skin: warm and dry, no rash Neuro:  Alert and Oriented x 3, Strength and sensation are intact Psych: euthymic mood, full affect  Wt Readings from Last 3 Encounters:  11/20/16 172 lb (78 kg)  11/01/15 167 lb 12.8 oz (76.1 kg)  09/08/14 178 lb (80.7 kg)      Studies/Labs Reviewed:   EKG:  EKG  Sinus bradycardia 53 bpm with PR interval 222 ms. Sinus bradycardia is brand-new. Ordinarily in atrial fibrillation or supraventricular atrial tachycardia. Impaired to the most recent tracings, atrial fibrillation is  resolved.  Recent Labs: No results found for requested labs within last 8760 hours.   Lipid Panel    Component Value Date/Time   CHOL 145 01/22/2014 1214   TRIG 110.0 01/22/2014 1214   HDL 36.30 (L) 01/22/2014 1214   CHOLHDL 4 01/22/2014 1214   VLDL 22.0 01/22/2014 1214   LDLCALC 87 01/22/2014 1214    Additional studies/ records that were reviewed today include:      ASSESSMENT:    1. Chronic atrial fibrillation (HCC)   2. Essential hypertension   3. Right bundle branch block   4. Other hyperlipidemia   5. Chronic anticoagulation      PLAN:  In order of problems listed above:  1. WOW!!! In sinus bradycardia. This is the first EKG of this nature/rhythm in years. He feels well. He is currently on beta blocker and diltiazem which could cause excessive bradycardia. If he remains in sinus rhythm and tends to have bradycardia we will need to decrease or discontinue diltiazem and or metoprolol. For the time being, clinical observation. 2. Significant systolic blood pressure elevation today. As usual, he states this is much and then he gets at home. I have asked him to monitor home pressures twice weekly for one month and call results. If pressure is still high, medication adjustment will be necessary. We'll also need to consider adjustments based upon heart rate since he is bradycardic now. 3. Right bundle is present and now in sinus rhythm with first-degree AV block. Close monitoring given his current medical regimen. 4. LDL cholesterol target less than 70. Followed by primary care. 5. Monitor for evidence of bleeding. Continue anticoagulation therapy.    Medication Adjustments/Labs and Tests Ordered: Current medicines are reviewed at length with the patient today.  Concerns regarding medicines are outlined above.  Medication changes, Labs and Tests ordered today are listed in the Patient Instructions below. There are no Patient Instructions on file for this visit.    Signed, Lesleigh Noe, MD  11/20/2016 12:01 PM    [facility] [address] [place], [place], [place]  [address] Phone: [phone]; Fax: [phone]

## 2016-11-20 ENCOUNTER — Ambulatory Visit (INDEPENDENT_AMBULATORY_CARE_PROVIDER_SITE_OTHER): Payer: Medicare HMO | Admitting: Interventional Cardiology

## 2016-11-20 ENCOUNTER — Encounter: Payer: Self-pay | Admitting: Interventional Cardiology

## 2016-11-20 VITALS — BP 160/88 | HR 53 | Ht 70.0 in | Wt 172.0 lb

## 2016-11-20 DIAGNOSIS — Z7901 Long term (current) use of anticoagulants: Secondary | ICD-10-CM | POA: Diagnosis not present

## 2016-11-20 DIAGNOSIS — I4510 Unspecified right bundle-branch block: Secondary | ICD-10-CM

## 2016-11-20 DIAGNOSIS — I482 Chronic atrial fibrillation, unspecified: Secondary | ICD-10-CM

## 2016-11-20 DIAGNOSIS — E7849 Other hyperlipidemia: Secondary | ICD-10-CM

## 2016-11-20 DIAGNOSIS — E784 Other hyperlipidemia: Secondary | ICD-10-CM

## 2016-11-20 DIAGNOSIS — I10 Essential (primary) hypertension: Secondary | ICD-10-CM | POA: Diagnosis not present

## 2016-11-20 NOTE — Patient Instructions (Signed)
Medication Instructions:  None  Labwork: None  Testing/Procedures: None  Follow-Up: Your physician wants you to follow-up in: 1 year with Dr. Katrinka BlazingSmith. You will receive a reminder letter in the mail two months in advance. If you don't receive a letter, please call our office to schedule the follow-up appointment.   Any Other Special Instructions Will Be Listed Below (If Applicable).  Monitor your blood pressure atleast twice a week for a month and then call our office with those readings.  Take this measurement approximately 2 hours after you take your medication.  Try to take readings around the same time each day.    If you need a refill on your cardiac medications before your next appointment, please call your pharmacy.

## 2016-12-11 ENCOUNTER — Ambulatory Visit (INDEPENDENT_AMBULATORY_CARE_PROVIDER_SITE_OTHER): Payer: Medicare HMO | Admitting: *Deleted

## 2016-12-11 ENCOUNTER — Other Ambulatory Visit: Payer: Self-pay | Admitting: Interventional Cardiology

## 2016-12-11 DIAGNOSIS — Z5181 Encounter for therapeutic drug level monitoring: Secondary | ICD-10-CM

## 2016-12-11 DIAGNOSIS — I4891 Unspecified atrial fibrillation: Secondary | ICD-10-CM

## 2016-12-11 LAB — POCT INR: INR: 3.2

## 2016-12-20 ENCOUNTER — Other Ambulatory Visit: Payer: Self-pay | Admitting: Interventional Cardiology

## 2016-12-20 MED ORDER — DILTIAZEM HCL ER COATED BEADS 120 MG PO CP24: 120.0000 mg | ORAL_CAPSULE | Freq: Every day | ORAL | 3 refills | Status: DC

## 2016-12-25 ENCOUNTER — Ambulatory Visit (INDEPENDENT_AMBULATORY_CARE_PROVIDER_SITE_OTHER): Payer: Medicare HMO

## 2016-12-25 DIAGNOSIS — I4891 Unspecified atrial fibrillation: Secondary | ICD-10-CM

## 2016-12-25 DIAGNOSIS — Z5181 Encounter for therapeutic drug level monitoring: Secondary | ICD-10-CM | POA: Diagnosis not present

## 2016-12-25 LAB — POCT INR: INR: 2.4

## 2017-01-04 ENCOUNTER — Other Ambulatory Visit: Payer: Self-pay | Admitting: Interventional Cardiology

## 2017-01-23 ENCOUNTER — Ambulatory Visit (INDEPENDENT_AMBULATORY_CARE_PROVIDER_SITE_OTHER): Payer: Medicare HMO | Admitting: *Deleted

## 2017-01-23 DIAGNOSIS — Z5181 Encounter for therapeutic drug level monitoring: Secondary | ICD-10-CM | POA: Diagnosis not present

## 2017-01-23 DIAGNOSIS — I4891 Unspecified atrial fibrillation: Secondary | ICD-10-CM

## 2017-01-23 LAB — POCT INR: INR: 2.8

## 2017-02-20 ENCOUNTER — Ambulatory Visit (INDEPENDENT_AMBULATORY_CARE_PROVIDER_SITE_OTHER): Payer: Medicare HMO | Admitting: *Deleted

## 2017-02-20 DIAGNOSIS — Z5181 Encounter for therapeutic drug level monitoring: Secondary | ICD-10-CM

## 2017-02-20 DIAGNOSIS — I4891 Unspecified atrial fibrillation: Secondary | ICD-10-CM | POA: Diagnosis not present

## 2017-02-20 LAB — POCT INR: INR: 2.4

## 2017-04-05 ENCOUNTER — Ambulatory Visit (INDEPENDENT_AMBULATORY_CARE_PROVIDER_SITE_OTHER): Payer: Medicare HMO | Admitting: *Deleted

## 2017-04-05 ENCOUNTER — Encounter (INDEPENDENT_AMBULATORY_CARE_PROVIDER_SITE_OTHER): Payer: Self-pay

## 2017-04-05 DIAGNOSIS — I4891 Unspecified atrial fibrillation: Secondary | ICD-10-CM

## 2017-04-05 DIAGNOSIS — Z5181 Encounter for therapeutic drug level monitoring: Secondary | ICD-10-CM | POA: Diagnosis not present

## 2017-04-05 LAB — POCT INR: INR: 2.3

## 2017-05-03 ENCOUNTER — Ambulatory Visit (INDEPENDENT_AMBULATORY_CARE_PROVIDER_SITE_OTHER): Payer: Medicare HMO | Admitting: *Deleted

## 2017-05-03 DIAGNOSIS — Z5181 Encounter for therapeutic drug level monitoring: Secondary | ICD-10-CM

## 2017-05-03 DIAGNOSIS — I482 Chronic atrial fibrillation, unspecified: Secondary | ICD-10-CM

## 2017-05-03 DIAGNOSIS — I4891 Unspecified atrial fibrillation: Secondary | ICD-10-CM

## 2017-05-03 LAB — POCT INR: INR: 2

## 2017-05-28 ENCOUNTER — Ambulatory Visit (INDEPENDENT_AMBULATORY_CARE_PROVIDER_SITE_OTHER): Payer: Medicare HMO | Admitting: *Deleted

## 2017-05-28 DIAGNOSIS — I482 Chronic atrial fibrillation, unspecified: Secondary | ICD-10-CM

## 2017-05-28 DIAGNOSIS — I4891 Unspecified atrial fibrillation: Secondary | ICD-10-CM

## 2017-05-28 DIAGNOSIS — Z5181 Encounter for therapeutic drug level monitoring: Secondary | ICD-10-CM | POA: Diagnosis not present

## 2017-05-28 LAB — POCT INR: INR: 2.7

## 2017-07-09 ENCOUNTER — Ambulatory Visit (INDEPENDENT_AMBULATORY_CARE_PROVIDER_SITE_OTHER): Payer: Medicare HMO | Admitting: *Deleted

## 2017-07-09 DIAGNOSIS — I482 Chronic atrial fibrillation, unspecified: Secondary | ICD-10-CM

## 2017-07-09 DIAGNOSIS — Z5181 Encounter for therapeutic drug level monitoring: Secondary | ICD-10-CM | POA: Diagnosis not present

## 2017-07-09 DIAGNOSIS — Z23 Encounter for immunization: Secondary | ICD-10-CM | POA: Diagnosis not present

## 2017-07-09 DIAGNOSIS — I4891 Unspecified atrial fibrillation: Secondary | ICD-10-CM

## 2017-07-09 LAB — POCT INR: INR: 2.5

## 2017-07-09 NOTE — Progress Notes (Signed)
07/09/17-INR 2.5 Instructions: Continue same dose 1 tablet on all days except 1/2 tablet on Wednesdays.  Recheck INR in 6 weeks.

## 2017-07-16 ENCOUNTER — Other Ambulatory Visit: Payer: Self-pay | Admitting: Interventional Cardiology

## 2017-08-27 ENCOUNTER — Ambulatory Visit: Payer: Medicare HMO | Admitting: *Deleted

## 2017-08-27 DIAGNOSIS — I482 Chronic atrial fibrillation, unspecified: Secondary | ICD-10-CM

## 2017-08-27 DIAGNOSIS — Z5181 Encounter for therapeutic drug level monitoring: Secondary | ICD-10-CM

## 2017-08-27 DIAGNOSIS — I4891 Unspecified atrial fibrillation: Secondary | ICD-10-CM

## 2017-08-27 LAB — POCT INR: INR: 2.5

## 2017-08-27 NOTE — Patient Instructions (Signed)
Description   Continue same dose 1 tablet on all days except 1/2 tablet on Wednesdays.  Recheck INR in 6 weeks.

## 2017-10-08 ENCOUNTER — Ambulatory Visit: Payer: Medicare HMO | Admitting: *Deleted

## 2017-10-08 DIAGNOSIS — I482 Chronic atrial fibrillation, unspecified: Secondary | ICD-10-CM

## 2017-10-08 DIAGNOSIS — Z5181 Encounter for therapeutic drug level monitoring: Secondary | ICD-10-CM

## 2017-10-08 DIAGNOSIS — I4891 Unspecified atrial fibrillation: Secondary | ICD-10-CM

## 2017-10-08 LAB — POCT INR: INR: 3.9

## 2017-10-08 NOTE — Patient Instructions (Addendum)
Description   Hold tomorrow's dose then continue same dose 1 tablet on all days except 1/2 tablet on Wednesdays.  Recheck INR in 4 weeks.

## 2017-10-27 ENCOUNTER — Encounter (HOSPITAL_COMMUNITY): Payer: Self-pay

## 2017-10-27 ENCOUNTER — Emergency Department (HOSPITAL_COMMUNITY): Payer: Medicare HMO

## 2017-10-27 ENCOUNTER — Inpatient Hospital Stay (HOSPITAL_COMMUNITY): Payer: Medicare HMO

## 2017-10-27 ENCOUNTER — Inpatient Hospital Stay (HOSPITAL_COMMUNITY)
Admission: EM | Admit: 2017-10-27 | Discharge: 2017-11-07 | DRG: 065 | Disposition: A | Discharge status: Skilled Nursing Facility | Payer: Medicare HMO | Attending: Neurology | Admitting: Neurology

## 2017-10-27 DIAGNOSIS — I613 Nontraumatic intracerebral hemorrhage in brain stem: Secondary | ICD-10-CM | POA: Diagnosis not present

## 2017-10-27 DIAGNOSIS — R471 Dysarthria and anarthria: Secondary | ICD-10-CM | POA: Diagnosis not present

## 2017-10-27 DIAGNOSIS — E785 Hyperlipidemia, unspecified: Secondary | ICD-10-CM | POA: Diagnosis not present

## 2017-10-27 DIAGNOSIS — I509 Heart failure, unspecified: Secondary | ICD-10-CM

## 2017-10-27 DIAGNOSIS — I4892 Unspecified atrial flutter: Secondary | ICD-10-CM

## 2017-10-27 DIAGNOSIS — R29810 Facial weakness: Secondary | ICD-10-CM | POA: Diagnosis not present

## 2017-10-27 DIAGNOSIS — R29711 NIHSS score 11: Secondary | ICD-10-CM | POA: Diagnosis present

## 2017-10-27 DIAGNOSIS — I629 Nontraumatic intracranial hemorrhage, unspecified: Secondary | ICD-10-CM

## 2017-10-27 DIAGNOSIS — G464 Cerebellar stroke syndrome: Secondary | ICD-10-CM | POA: Diagnosis not present

## 2017-10-27 DIAGNOSIS — I10 Essential (primary) hypertension: Secondary | ICD-10-CM | POA: Diagnosis not present

## 2017-10-27 DIAGNOSIS — I340 Nonrheumatic mitral (valve) insufficiency: Secondary | ICD-10-CM | POA: Diagnosis not present

## 2017-10-27 DIAGNOSIS — I4891 Unspecified atrial fibrillation: Secondary | ICD-10-CM | POA: Diagnosis not present

## 2017-10-27 DIAGNOSIS — D72829 Elevated white blood cell count, unspecified: Secondary | ICD-10-CM | POA: Diagnosis not present

## 2017-10-27 DIAGNOSIS — I6789 Other cerebrovascular disease: Secondary | ICD-10-CM | POA: Diagnosis not present

## 2017-10-27 DIAGNOSIS — F329 Major depressive disorder, single episode, unspecified: Secondary | ICD-10-CM | POA: Diagnosis present

## 2017-10-27 DIAGNOSIS — K219 Gastro-esophageal reflux disease without esophagitis: Secondary | ICD-10-CM | POA: Diagnosis present

## 2017-10-27 DIAGNOSIS — R2689 Other abnormalities of gait and mobility: Secondary | ICD-10-CM | POA: Diagnosis not present

## 2017-10-27 DIAGNOSIS — E7800 Pure hypercholesterolemia, unspecified: Secondary | ICD-10-CM | POA: Diagnosis not present

## 2017-10-27 DIAGNOSIS — F419 Anxiety disorder, unspecified: Secondary | ICD-10-CM | POA: Diagnosis present

## 2017-10-27 DIAGNOSIS — T17908A Unspecified foreign body in respiratory tract, part unspecified causing other injury, initial encounter: Secondary | ICD-10-CM | POA: Diagnosis not present

## 2017-10-27 DIAGNOSIS — Z66 Do not resuscitate: Secondary | ICD-10-CM | POA: Diagnosis not present

## 2017-10-27 DIAGNOSIS — R1312 Dysphagia, oropharyngeal phase: Secondary | ICD-10-CM

## 2017-10-27 DIAGNOSIS — R488 Other symbolic dysfunctions: Secondary | ICD-10-CM | POA: Diagnosis not present

## 2017-10-27 DIAGNOSIS — G8191 Hemiplegia, unspecified affecting right dominant side: Secondary | ICD-10-CM | POA: Diagnosis present

## 2017-10-27 DIAGNOSIS — G8190 Hemiplegia, unspecified affecting unspecified side: Secondary | ICD-10-CM | POA: Diagnosis not present

## 2017-10-27 DIAGNOSIS — I69351 Hemiplegia and hemiparesis following cerebral infarction affecting right dominant side: Secondary | ICD-10-CM | POA: Diagnosis not present

## 2017-10-27 DIAGNOSIS — Z7901 Long term (current) use of anticoagulants: Secondary | ICD-10-CM

## 2017-10-27 DIAGNOSIS — I69322 Dysarthria following cerebral infarction: Secondary | ICD-10-CM | POA: Diagnosis not present

## 2017-10-27 DIAGNOSIS — I5032 Chronic diastolic (congestive) heart failure: Secondary | ICD-10-CM | POA: Diagnosis not present

## 2017-10-27 DIAGNOSIS — I50812 Chronic right heart failure: Secondary | ICD-10-CM | POA: Diagnosis not present

## 2017-10-27 DIAGNOSIS — I69251 Hemiplegia and hemiparesis following other nontraumatic intracranial hemorrhage affecting right dominant side: Secondary | ICD-10-CM | POA: Diagnosis not present

## 2017-10-27 DIAGNOSIS — R29818 Other symptoms and signs involving the nervous system: Secondary | ICD-10-CM | POA: Diagnosis not present

## 2017-10-27 DIAGNOSIS — R1310 Dysphagia, unspecified: Secondary | ICD-10-CM | POA: Diagnosis present

## 2017-10-27 DIAGNOSIS — R0602 Shortness of breath: Secondary | ICD-10-CM | POA: Diagnosis not present

## 2017-10-27 DIAGNOSIS — I484 Atypical atrial flutter: Secondary | ICD-10-CM | POA: Diagnosis not present

## 2017-10-27 DIAGNOSIS — E7849 Other hyperlipidemia: Secondary | ICD-10-CM | POA: Diagnosis not present

## 2017-10-27 DIAGNOSIS — Z79899 Other long term (current) drug therapy: Secondary | ICD-10-CM | POA: Diagnosis not present

## 2017-10-27 DIAGNOSIS — R278 Other lack of coordination: Secondary | ICD-10-CM | POA: Diagnosis not present

## 2017-10-27 DIAGNOSIS — I619 Nontraumatic intracerebral hemorrhage, unspecified: Secondary | ICD-10-CM | POA: Diagnosis present

## 2017-10-27 DIAGNOSIS — R4701 Aphasia: Secondary | ICD-10-CM | POA: Diagnosis not present

## 2017-10-27 DIAGNOSIS — I482 Chronic atrial fibrillation: Secondary | ICD-10-CM | POA: Diagnosis present

## 2017-10-27 DIAGNOSIS — S06360A Traumatic hemorrhage of cerebrum, unspecified, without loss of consciousness, initial encounter: Secondary | ICD-10-CM | POA: Diagnosis not present

## 2017-10-27 DIAGNOSIS — I69191 Dysphagia following nontraumatic intracerebral hemorrhage: Secondary | ICD-10-CM | POA: Diagnosis not present

## 2017-10-27 DIAGNOSIS — M6281 Muscle weakness (generalized): Secondary | ICD-10-CM | POA: Diagnosis not present

## 2017-10-27 DIAGNOSIS — J9811 Atelectasis: Secondary | ICD-10-CM | POA: Diagnosis not present

## 2017-10-27 LAB — CBC
HCT: 45.5 % (ref 39.0–52.0)
Hemoglobin: 15.7 g/dL (ref 13.0–17.0)
MCH: 34.4 pg — AB (ref 26.0–34.0)
MCHC: 34.5 g/dL (ref 30.0–36.0)
MCV: 99.6 fL (ref 78.0–100.0)
PLATELETS: 204 10*3/uL (ref 150–400)
RBC: 4.57 MIL/uL (ref 4.22–5.81)
RDW: 12.8 % (ref 11.5–15.5)
WBC: 7.5 10*3/uL (ref 4.0–10.5)

## 2017-10-27 LAB — I-STAT CHEM 8, ED
BUN: 19 mg/dL (ref 6–20)
CALCIUM ION: 1.2 mmol/L (ref 1.15–1.40)
Chloride: 101 mmol/L (ref 101–111)
Creatinine, Ser: 1.3 mg/dL — ABNORMAL HIGH (ref 0.61–1.24)
GLUCOSE: 129 mg/dL — AB (ref 65–99)
HCT: 47 % (ref 39.0–52.0)
HEMOGLOBIN: 16 g/dL (ref 13.0–17.0)
POTASSIUM: 4.2 mmol/L (ref 3.5–5.1)
Sodium: 139 mmol/L (ref 135–145)
TCO2: 27 mmol/L (ref 22–32)

## 2017-10-27 LAB — COMPREHENSIVE METABOLIC PANEL
ALT: 34 U/L (ref 17–63)
ANION GAP: 13 (ref 5–15)
AST: 44 U/L — ABNORMAL HIGH (ref 15–41)
Albumin: 3.9 g/dL (ref 3.5–5.0)
Alkaline Phosphatase: 86 U/L (ref 38–126)
BUN: 16 mg/dL (ref 6–20)
CALCIUM: 9.8 mg/dL (ref 8.9–10.3)
CO2: 24 mmol/L (ref 22–32)
Chloride: 100 mmol/L — ABNORMAL LOW (ref 101–111)
Creatinine, Ser: 1.31 mg/dL — ABNORMAL HIGH (ref 0.61–1.24)
GFR, EST AFRICAN AMERICAN: 56 mL/min — AB (ref >60)
GFR, EST NON AFRICAN AMERICAN: 48 mL/min — AB (ref >60)
Glucose, Bld: 133 mg/dL — ABNORMAL HIGH (ref 65–99)
Potassium: 4.3 mmol/L (ref 3.5–5.1)
SODIUM: 137 mmol/L (ref 135–145)
Total Bilirubin: 1 mg/dL (ref 0.3–1.2)
Total Protein: 7.1 g/dL (ref 6.5–8.1)

## 2017-10-27 LAB — CBG MONITORING, ED: GLUCOSE-CAPILLARY: 121 mg/dL — AB (ref 65–99)

## 2017-10-27 LAB — DIFFERENTIAL
BASOS PCT: 0 %
Basophils Absolute: 0 10*3/uL (ref 0.0–0.1)
EOS PCT: 3 %
Eosinophils Absolute: 0.2 10*3/uL (ref 0.0–0.7)
Lymphocytes Relative: 40 %
Lymphs Abs: 3 10*3/uL (ref 0.7–4.0)
MONO ABS: 0.5 10*3/uL (ref 0.1–1.0)
Monocytes Relative: 7 %
Neutro Abs: 3.8 10*3/uL (ref 1.7–7.7)
Neutrophils Relative %: 50 %

## 2017-10-27 LAB — LIPID PANEL
CHOLESTEROL: 154 mg/dL (ref 0–200)
HDL: 42 mg/dL (ref >40)
LDL Cholesterol: 91 mg/dL (ref 0–99)
TRIGLYCERIDES: 105 mg/dL (ref <150)
Total CHOL/HDL Ratio: 3.7 RATIO
VLDL: 21 mg/dL (ref 0–40)

## 2017-10-27 LAB — I-STAT TROPONIN, ED: TROPONIN I, POC: 0 ng/mL (ref 0.00–0.08)

## 2017-10-27 LAB — HEMOGLOBIN A1C
Hgb A1c MFr Bld: 5.8 % — ABNORMAL HIGH (ref 4.8–5.6)
MEAN PLASMA GLUCOSE: 119.76 mg/dL

## 2017-10-27 LAB — PROTIME-INR
INR: 2.33
INR: 1.35
INR: 1.39
PROTHROMBIN TIME: 25.4 s — AB (ref 11.4–15.2)
Prothrombin Time: 16.6 seconds — ABNORMAL HIGH (ref 11.4–15.2)
Prothrombin Time: 16.9 seconds — ABNORMAL HIGH (ref 11.4–15.2)

## 2017-10-27 LAB — MRSA PCR SCREENING: MRSA BY PCR: NEGATIVE

## 2017-10-27 LAB — APTT: aPTT: 40 seconds — ABNORMAL HIGH (ref 24–36)

## 2017-10-27 MED ORDER — PANTOPRAZOLE SODIUM 40 MG IV SOLR
40.0000 mg | Freq: Every day | INTRAVENOUS | Status: DC
  Administered 2017-10-27 – 2017-11-02 (×7): 40 mg via INTRAVENOUS
  Filled 2017-10-27 (×8): qty 40

## 2017-10-27 MED ORDER — PROTHROMBIN COMPLEX CONC HUMAN 500 UNITS IV KIT
1562.0000 [IU] | PACK | Freq: Once | Status: AC
  Administered 2017-10-27: 1562 [IU] via INTRAVENOUS
  Filled 2017-10-27: qty 1562

## 2017-10-27 MED ORDER — LABETALOL HCL 5 MG/ML IV SOLN
10.0000 mg | INTRAVENOUS | Status: DC | PRN
  Administered 2017-10-27 – 2017-10-31 (×12): 10 mg via INTRAVENOUS
  Filled 2017-10-27 (×13): qty 4

## 2017-10-27 MED ORDER — NICARDIPINE HCL IN NACL 20-0.86 MG/200ML-% IV SOLN
INTRAVENOUS | Status: AC
  Administered 2017-10-27: 5 mg/h via INTRAVENOUS
  Filled 2017-10-27: qty 200

## 2017-10-27 MED ORDER — ACETAMINOPHEN 650 MG RE SUPP: 650.0000 mg | RECTAL | Status: DC | PRN

## 2017-10-27 MED ORDER — ACETAMINOPHEN 325 MG PO TABS: 650.0000 mg | ORAL_TABLET | ORAL | Status: DC | PRN

## 2017-10-27 MED ORDER — ACETAMINOPHEN 160 MG/5ML PO SOLN
650.0000 mg | ORAL | Status: DC | PRN
  Administered 2017-10-29 – 2017-11-01 (×2): 650 mg
  Filled 2017-10-27 (×2): qty 20.3

## 2017-10-27 MED ORDER — STROKE: EARLY STAGES OF RECOVERY BOOK
Freq: Once | Status: AC
  Administered 2017-10-27: 12:00:00
  Filled 2017-10-27: qty 1

## 2017-10-27 MED ORDER — NICARDIPINE HCL IN NACL 20-0.86 MG/200ML-% IV SOLN
0.0000 mg/h | INTRAVENOUS | Status: DC
  Administered 2017-10-27: 5 mg/h via INTRAVENOUS
  Administered 2017-10-28: 1.5 mg/h via INTRAVENOUS
  Filled 2017-10-27: qty 200

## 2017-10-27 MED ORDER — SENNOSIDES-DOCUSATE SODIUM 8.6-50 MG PO TABS
1.0000 | ORAL_TABLET | Freq: Two times a day (BID) | ORAL | Status: DC
  Administered 2017-10-28 – 2017-10-30 (×5): 1 via ORAL
  Filled 2017-10-27 (×5): qty 1

## 2017-10-27 MED ORDER — VITAMIN K1 10 MG/ML IJ SOLN
10.0000 mg | Freq: Once | INTRAVENOUS | Status: AC
  Administered 2017-10-27: 10 mg via INTRAVENOUS
  Filled 2017-10-27: qty 1

## 2017-10-27 MED ORDER — SODIUM CHLORIDE 0.9 % IV SOLN
INTRAVENOUS | Status: DC
  Administered 2017-10-27 – 2017-11-01 (×6): via INTRAVENOUS

## 2017-10-27 NOTE — ED Notes (Signed)
Delay in cardene gtt due to pt only having 1 IV. Multiple attempts for additional IV

## 2017-10-27 NOTE — ED Notes (Signed)
Got patient on the monitor did ekg shown to Dr Fredderick PhenixBelfi
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
## 2017-10-27 NOTE — Code Documentation (Signed)
This am he was at an [employer], [employer]est[employer] sale with family.  As he was about to climb a ladder he suddenly slumped to one side at 0925.  EMS called code stroke en route at 1023.  Patient arrived at 1033, stat labs and head CT done.  Dr Aroor at bedside to assess patient. Patient fully alert and maintaining good airway. Patient with history of Afib on Coumadin. NIHSS 11: Right arm 4, leg 3.  Left gaze preference.  Facial droop and slurred speech.  Per Radiology hemorrhage in Pons.  Family to bedside, spoke with Dr Aroor and EDP.  Kcentra and Vit K given IV.  Labetalol given for BP control.  Cardene ordered to maintain SBP < 140.    Plan admit ICU.
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
## 2017-10-27 NOTE — H&P (Addendum)
NEUROHOSPITALISTS - HISTORY AND PHYSICAL   [facility]: Dr. Laurence Slate  Admit date: 10/27/2017    Chief Complaint: Right side weakness  History obtained from:  Patient, family and Chart   HPI   [name] is an 84 y.o. male with a PMH of HTN, HLD and AFIB on Coumadin brought into the ED this morning by EMS with reports of acute onset Right sided weakness,facial droop, dysarthria and left gaze preference. Patient was found down outside near his barn. Per reports her was just about to climb a ladder when he fell to the ground. B/P in the field was 168/82 and pulse of 118. Patient is able to tell us that he is compliant with his Coumadin. Family denies any recent illness, medication changes or hospitalizations.  His last INR check was 3.9 on 10/08/2017 per the medical record.   On admission the INR is 2.33 Initial Head CT shows large ICH Left pons IV Kcentra and vitamin K in progress  Date last known well: Date: 10/27/2017 Time last known well: Time: 09:25 tPA Given: No: ICH on Coumadin Modified Rankin: Rankin Score=0 NIHSS: 11  Past Medical History: He  has a past medical history of Atrial fibrillation (HCC), Atrial flutter (HCC), Atrial flutter (HCC), Edema (03/17/2009), GERD (gastroesophageal reflux disease), HTN (hypertension), Hypercholesteremia, and Hyperlipidemia.  Past Surgical History: He  has no past surgical history on file.  Family History: indicated that his mother is deceased. He indicated that his father is deceased. He indicated that his sister is alive. He indicated that his maternal grandmother is deceased. He indicated that his maternal grandfather is deceased. He indicated that his paternal grandmother is deceased. He indicated that his paternal grandfather is deceased.  Social History:  He  reports that  has never smoked. he has never used smokeless tobacco. He reports that he does not drink alcohol or use drugs.  Allergies:  No  Known Allergies  Medications:  Current Facility-Administered Medications:  .  labetalol (NORMODYNE,TRANDATE) injection 10 mg, 10 mg, Intravenous, Q10 min PRN, Hollan Philipp, Georgiana Spinner R, MD, 10 mg at 10/27/17 1050 .  nicardipine (CARDENE) 20mg  in 0.86% saline IV infusion (0.1 mg/ml), 0-15 mg/hr, Intravenous, Continuous, Ayline Dingus, Georgiana Spinner R, MD .  phytonadione (VITAMIN K) 10 mg in dextrose 5 % 50 mL IVPB, 10 mg, Intravenous, Once, Armandina Stammer, RPH .  prothrombin complex conc human (KCENTRA) IVPB 1,562 Units, 1,562 Units, Intravenous, Once, Armandina Stammer, [facility], Last Rate: 144 mL/hr at 10/27/17 1109, 1,562 Units at 10/27/17 1109  Current Outpatient Medications:  .  atorvastatin (LIPITOR) 20 MG tablet, Take 1 tablet (20 mg total) by mouth daily., Disp: 90 tablet, Rfl: 3 .  Calcium Carbonate-Vitamin D (CALCIUM 600+D3 PO), Take by mouth. TAKE ONE TABLET DAILY, Disp: , Rfl:  .  cholecalciferol (VITAMIN D) 1000 UNITS tablet, Take 1,000 Units by mouth daily., Disp: , Rfl:  .  diltiazem (CARDIZEM CD) 120 MG 24 hr capsule, Take 1 capsule (120 mg total) by mouth daily., Disp: 90 capsule, Rfl: 3 .  diltiazem (CARDIZEM CD) 120 MG 24 hr capsule, Take 1 capsule (120 mg total) by mouth daily., Disp: 90 capsule, Rfl: 3 .  losartan-hydrochlorothiazide (HYZAAR) 100-12.5 MG tablet, Take 1 tablet by mouth daily., Disp: 90 tablet, Rfl: 2 .  metoprolol succinate (TOPROL-XL) 100 MG 24 hr tablet, TAKE ONE TABLET BY MOUTH ONCE DAILY WITH OR IMMEDIATELY FOLLOWING A MEAL, Disp: 90 tablet, Rfl: 3 .  Multiple Vitamins-Minerals (MULTIVITAMIN WITH MINERALS) tablet, Take 1 tablet  by mouth daily., Disp: , Rfl:  .  Omega-3 Fatty Acids (FISH OIL) 1000 MG CAPS, Take by mouth. Take one daily, Disp: , Rfl:  .  warfarin (COUMADIN) 5 MG tablet, TAKE BY MOUTH AS DIRECTED BY COUMADIN CLINIC, Disp: 90 tablet, Rfl: 1  ROS: History obtained from the patient  General ROS: negative for - chills, fatigue, fever, night sweats,  weight gain or weight loss Psychological ROS: negative for - behavioral disorder, hallucinations, memory difficulties, mood swings or suicidal ideation Ophthalmic ROS: negative for - blurry vision, double vision, eye pain or loss of vision ENT ROS: negative for - epistaxis, nasal discharge, oral lesions, sore throat, tinnitus or vertigo Allergy and Immunology ROS: negative for - hives or itchy/watery eyes Hematological and Lymphatic ROS: negative for - bleeding problems, bruising or swollen lymph nodes Endocrine ROS: negative for - galactorrhea, hair pattern changes, polydipsia/polyuria or temperature intolerance Respiratory ROS: negative for - cough, hemoptysis, shortness of breath or wheezing Cardiovascular ROS: negative for - chest pain, dyspnea on exertion, edema or irregular heartbeat Gastrointestinal ROS: negative for - abdominal pain, diarrhea, hematemesis, nausea/vomiting or stool incontinence Genito-Urinary ROS: negative for - dysuria, hematuria, incontinence or urinary frequency/urgency Musculoskeletal ROS: negative for - joint swelling or muscular weakness Neurological ROS: as noted in HPI Dermatological ROS: venous stasis on left lower leg  Physical Examination: Vitals:   10/27/17 1000 10/27/17 1104  BP:  (!) 161/106  Pulse:  (!) 122  Resp:  18  Temp:  (!) 97.5 F (36.4 C)  TempSrc:  Oral  SpO2:  93%  Weight: 80.7 kg (177 lb 14.6 oz)    HEENT-  Normocephalic,  Cardiovascular - Regular rate and rhythm  Respiratory - Lungs clear bilaterally. Non-labored breathing, No wheezing. Abdomen - soft and non-tender, BS normal Extremities- no edema or cyanosis Skin-warm and dry, + venous statis on Left lower leg  Neurological Examination Mental Status: Alert, oriented, thought content appropriate.  Speech fluent without evidence of aphasia/neglect. Moderate dysarthria but understandable. Able to follow 3 step commands without difficulty. Cranial Nerves: II: Visual Lopezgarcia appear  full. Possibly a mild visual field cut on right. Pupils are equal, round, and reactive to light.   III,IV, VI: EOMI, Left gaze preference. Initially not able to cross midline, with repeat testing was able to cross midline. No ptosis/diploplia. No nystagmus.   V: Facial sensation is symmetric to touch/ temperature VII: Facial movement is decreased on right  VIII: hearing is intact to voice X: Uvula elevates symmetrically XI: Shoulder shrug is weak on right XII: tongue with deviation to left. No atrophy/fasciculations.  Motor: Tone is normal. Bulk is normal.     5/5 strength on Left RUE 0/5 RLE 1/5 Sensor: Sensation is symmetric to light touch and temperature in the arms and legs. Deep Tendon Reflexes: 2+ and symmetric biceps and 1+ patellae Plantars: Toes are downgoing on Left and up on right  Cerebellar: normal finger-to-nose, normal rapid alternating movements and normal heel-to-shin test on left. Unable to do on left Gait: not tested.   Lab Results: CBC: Recent Labs  Lab 10/27/17 1038 10/27/17 1041  WBC 7.5  --   HGB 15.7 16.0  HCT 45.5 47.0  MCV 99.6  --   PLT 204  --    Basic Metabolic Panel: Recent Labs  Lab 10/27/17 1041  NA 139  K 4.2  CL 101  GLUCOSE 129*  BUN 19  CREATININE 1.30*   Liver Function Tests: No results for input(s): AST, ALT, ALKPHOS, BILITOT,  PROT, ALBUMIN in the last 168 hours. No results for input(s): LIPASE, AMYLASE in the last 168 hours. No results for input(s): AMMONIA in the last 168 hours. Thyroid Function Studies: No results for input(s): TSH, T4TOTAL, T3FREE, THYROIDAB in the last 72 hours. Cardiac Enzymes: No results for input(s): CKTOTAL, CKMB, CKMBINDEX, TROPONINI in the last 168 hours. Coagulation Studies: Recent Labs    10/27/17 1038  APTT 40*  INR 2.33   No results for input(s): DDIMER in the last 72 hours. [place] Work -Up No results for input(s): VITAMINB12, FOLATE, IRON, RETICCTPCT in the last 72 hours. Lipid Panel: No  results for input(s): CHOL, TRIG, HDL, CHOLHDL, VLDL, LDLCALC in the last 168 hours. CBG & HgbA1C: Recent Labs  Lab 10/27/17 1036  GLUCAP 121*   No results for input(s): HGBA1C in the last 72 hours. Sepsis Labs: Recent Labs  Lab 10/27/17 1038  WBC 7.5   Microbiology: No results found for this or any previous visit (from the past 240 hour(s)). Urinalysis: No results for input(s): COLORURINE, APPEARANCEUR, LABSPEC, PHURINE, GLUCOSEU, HGBUR, BILIRUBINUR, KETONESUR, PROTEINUR, UROBILINOGEN, NITRITE, LEUKOCYTESUR in the last 168 hours. Urine Drug Screen:  No results found for: LABOPIA, COCAINSCRNUR, LABBENZ, AMPHETMU, THCU, LABBARB  Alcohol Level: No results for input(s): ETH in the last 168 hours.  Imaging: Ct Head Code Stroke Wo Contrast  Result Date: 10/27/2017 CLINICAL DATA:  Code stroke. 84 year old male with left side weakness. EXAM: CT HEAD WITHOUT CONTRAST TECHNIQUE: Contiguous axial images were obtained from the base of the skull through the vertex without intravenous contrast. COMPARISON:  No prior brain imaging. FINDINGS: Brain: Relatively large hyperdense and mixed density mass in the left pons compatible with acute pontine hemorrhage. Hemorrhage size estimated at 14 x 16 x 20 millimeters (AP by transverse by CC) for estimated blood volume of 2 milliliters. Only mild pontine expansion. Basilar cisterns remain patent. No intraventricular or extra-axial extension of blood. No other acute intracranial hemorrhage. No ventriculomegaly. Chronic appearing lacunar infarcts in the left corona radiata and left posterior deep white matter capsule. Patchy and confluent bilateral white matter hypodensity. No supratentorial cortically based infarct identified. Vascular: Calcified atherosclerosis at the skull base. No suspicious intracranial vascular hyperdensity. Skull: Negative. Sinuses/Orbits: Clear. Other: Leftward gaze deviation. Otherwise negative orbit and scalp soft tissues. ASPECTS [facility]  Stroke Program Early CT Score) Not applicable, acute hemorrhage. IMPRESSION: 1. Acute hemorrhage within the left pons, relatively large at up to 20 mm diameter (estimated blood volume 2 mL). No extra-axial or intraventricular extension at this time. No significant intracranial mass effect.  No ventriculomegaly. 2. ASPECTS is not applicable, acute hemorrhage. 3. These results were communicated to Dr. Laurence Slate at 10:52 amon 3/2/2019by text page via the [facility] messaging system. Electronically Signed   By: Odessa Fleming M.D.   On: 10/27/2017 10:53     IMPRESSION: [name] is a 84 y.o. male with PMH of HTN, HLD and AFIB on Coumadin. CT Head reveals:  Acute, Large Hemorrhage Left Pons  Suspected Etiology: Hemorrhage secondary to Coumadin with INR 2.33 Resultant Symptoms: Right sided deficits Stroke Risk Factors: atrial fibrillation, hyperlipidemia and hypertension Other Stroke Risk Factors: Advanced age,   Outstanding Stroke Work-up Studies:     Repeat CT Head in 4 hrs                        PENDING  PLAN  10/27/2017: Will admit to ICU IV Kcentra and Vitamin K now Frequent Neurochecks  Telemetry Monitoring  NPO until passes Stroke swallow screen Continue Statin HOLD ASA / AC therapy due to large ICH HgbA1C and Lipid Profile Repeat PT/INR in 4 hours PT/OT/SLP Consult PM & Rehab Consult Case Management /MSW Ongoing aggressive stroke risk factor management Patient will be counseled to be compliant with his medications Patient will be counseled on Lifestyle modifications including, Diet, Exercise, and Stress Follow up with [facility] in 6 weeks  MEDICAL ISSUES:  Possibly CKD - creatinine 1.30 on admission Unknown baseline Gentle IVF's Avoid nephrotoxic meds Repeat labs in AM  DYSPHAGIA: NPO until passes SLP swallow evaluation Aspiration Precautions in progress  AFIB, CHRONIC: Continue Metoprolol for Rate control as needed Continue to HOLD AC - due to large  ICH  HYPERTENSION: Stable SBP goal less than 140/90  Nicardipine drip, Labetolol PRN Long term BP goal normotensive. Home Meds: Toprol, Hyzaar, Cardizem  HYPERLIPIDEMIA:   CURRENT LIPID PANEL PENDING    Component Value Date/Time   CHOL 145 01/22/2014 1214   TRIG 110.0 01/22/2014 1214   HDL 36.30 (L) 01/22/2014 1214   CHOLHDL 4 01/22/2014 1214   VLDL 22.0 01/22/2014 1214   LDLCALC 87 01/22/2014 1214  Home Meds:  Lipitor 20 mg LDL  goal < 70 Continued on Lipitor to 20 mg daily, for now Continue statin at discharge  DIABETES: No results found for: HGBA1C- PENDING Recent Labs  Lab 10/27/17 1036  GLUCAP 121*  HgbA1c goal < 7.0  Other Active Problems: Active Problems:   * No active hospital problems. *    Hospital day # 0 VTE prophylaxis: SCD's  Diet - Diet NPO time specified   FAMILY UPDATES: No family at bedside  TEAM UPDATES:Belfi, Melanie, MD   Prior Home Stroke Medications:  warfarin daily  Discharge Stroke Meds:  Please discharge patient on TBD   Disposition:  Therapy Recs:               PENDING Home Equipment:         PENDING Follow Up:  Laurann Montana, MD -PCP Follow up in 1-2 weeks      Assessment and plan discussed with with attending physician and they are in agreement.    Beryl Meager, ANP-C [facility] 10/27/2017, 11:09 AM   10/27/2017 ATTENDING  ASSESSMENT:   [name] is a 84 year old male who is on warfarin for atrial fibrillation. He presented with sudden onset right hemiparesis, dysarthria and gaze deviation. CT head showed a pontine hemorrhage. His INR was 2.3 and the reversed immediately PCC and vitamin K. Repeat INR was 1.35. Repeat CT head was performed at 4 hours which shows stable size of hemorrhage. Patient admitted to ICU and Cardene drip is running to keep blood pressure less than 140 systolic.     This patient is neurologically critically ill due to ICH.  He is at risk for significant risk of neurological worsening  from cerebral edema,  death from brain herniation, heart failure,infection, respiratory failure and seizure. This patient's care requires constant monitoring of vital signs, hemodynamics, respiratory and cardiac monitoring, review of multiple databases, neurological assessment, discussion with family, other specialists and medical decision making of high complexity.  I spent 50  minutes of neurocritical time in the care of this patient.     Please page stroke NP  Or  PA  Or MD from 8am -4 pm  as this patient from this time will be  followed by the stroke.   You can look them up on [email or web address]  Password  [email or web address]

## 2017-10-28 ENCOUNTER — Inpatient Hospital Stay (HOSPITAL_COMMUNITY): Payer: Medicare HMO

## 2017-10-28 LAB — BASIC METABOLIC PANEL
ANION GAP: 12 (ref 5–15)
BUN: 16 mg/dL (ref 6–20)
CHLORIDE: 101 mmol/L (ref 101–111)
CO2: 23 mmol/L (ref 22–32)
Calcium: 9.5 mg/dL (ref 8.9–10.3)
Creatinine, Ser: 1.17 mg/dL (ref 0.61–1.24)
GFR calc Af Amer: >60 mL/min (ref >60)
GFR, EST NON AFRICAN AMERICAN: 55 mL/min — AB (ref >60)
Glucose, Bld: 125 mg/dL — ABNORMAL HIGH (ref 65–99)
POTASSIUM: 4 mmol/L (ref 3.5–5.1)
Sodium: 136 mmol/L (ref 135–145)

## 2017-10-28 LAB — PROTIME-INR
INR: 1.33
INR: 1.25
Prothrombin Time: 16.3 seconds — ABNORMAL HIGH (ref 11.4–15.2)
Prothrombin Time: 15.6 seconds — ABNORMAL HIGH (ref 11.4–15.2)

## 2017-10-28 NOTE — Progress Notes (Signed)
Patient arrived to the unit alert and Oriented X 4. C/o pain in his neck. Nurse will Reposition to make him comfortable. Family at bedside. All questions and concerns addressed. Call light in reach. Bed in lowest position and alarm set.
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
## 2017-10-28 NOTE — Evaluation (Signed)
Physical Therapy Evaluation Patient Details Name: [name] MRN: [id number] DOB: 16-Oct-1929 Today's Date: 10/28/2017   History of Present Illness  Pt is an 84 y/o male with a PMH significant for HTN, a-flutter, a-fib. As he was about to climb a ladder he suddenly slumped to one side and EMS was called. CT revealed large ICH L pons.   Clinical Impression  Pt admitted with above diagnosis. Pt currently with functional limitations due to the deficits listed below (see PT Problem List). At the time of PT eval, pt was able to perform transfers to/from EOB with +2 max to total assist. Pt was able to tolerate EOB activity ~10 minutes however required constant assistance to maintain upright posture. Daughter present and revealed that pt's wife has dementia and would not be able to provide the level of care the patient will likely need upon d/c. The daughter works full time and is not able to provide 24 hour assistance upon return home either. We discussed the recommendation of SNF with pt and family and they are all agreeable. Pt will benefit from skilled PT to increase their independence and safety with mobility to allow discharge to the venue listed below.       Follow Up Recommendations SNF;Supervision/Assistance - 24 hour    Equipment Recommendations  Other (comment)(TBD by next venue of care)    Recommendations for Other Services       Precautions / Restrictions Precautions Precautions: Fall Precaution Comments: R side weakness and inattention Restrictions Weight Bearing Restrictions: No      Mobility  Bed Mobility Overal bed mobility: Needs Assistance Bed Mobility: Supine to Sit;Sit to Supine     Supine to sit: Max assist;+2 for physical assistance;HOB elevated Sit to supine: Total assist;+2 for physical assistance   General bed mobility comments: Pt initiating some movement of LE's to EOB, and was attempting to reach for L railing for support. Therapists used bed pad for  scooting assist, and provided trunk support upon transition to sitting.   Transfers                 General transfer comment: Unable  Ambulation/Gait                Stairs            Wheelchair Mobility    Modified Rankin (Stroke Patients Only) Modified Rankin (Stroke Patients Only) Pre-Morbid Rankin Score: No symptoms Modified Rankin: Severe disability     Balance Overall balance assessment: Needs assistance Sitting-balance support: No upper extremity supported;Feet supported Sitting balance-Leahy Scale: Zero Sitting balance - Comments: Pt sitting EOB with strong poterior lean noted at times. With UE support on the L, pt was able to assist in maintaining upright posture. When pt was attempting to wash face with L hand, total assist provided for truncal balance.  Postural control: Posterior lean;Right lateral lean                                   Pertinent Vitals/Pain Pain Assessment: Faces Faces Pain Scale: Hurts a little bit Pain Location: neck Pain Descriptors / Indicators: Aching;Discomfort Pain Intervention(s): Monitored during session;Repositioned    Home Living Family/patient expects to be discharged to:: Private residence Living Arrangements: Spouse/significant other Available Help at Discharge: Family;Available 24 hours/day(wife has dementia) Type of Home: House Home Access: Stairs to enter   Entergy CorporationEntrance Stairs-Number of Steps: 2 Home Layout: Two level;Able to live  on main level with bedroom/bathroom;Laundry or work area in [employer] Equipment: Information systems manager - built in      Prior Function Level of Independence: Independent         Comments: Family reports pt very active PTA, independent and driving     Hand Dominance   Dominant Hand: Right    Extremity/Trunk Assessment   Upper Extremity Assessment Upper Extremity Assessment: Defer to OT evaluation;RUE deficits/detail    Lower Extremity Assessment Lower  Extremity Assessment: RLE deficits/detail RLE Deficits / Details: Decreased strength and AROM noted bilaterally, however strength in RLE<LLE. Noted pt was initiating heel slide on the R when cued, and demonstrated ~2/5 strength in R quads.     Cervical / Trunk Assessment Cervical / Trunk Assessment: Kyphotic;Other exceptions Cervical / Trunk Exceptions: Forward head posture with rounded shoulders in sitting  Communication      Cognition Arousal/Alertness: Awake/alert Behavior During Therapy: WFL for tasks assessed/performed Overall Cognitive Status: Impaired/Different from baseline Area of Impairment: Attention;Memory;Following commands;Safety/judgement;Awareness;Problem solving                   Current Attention Level: Sustained Memory: Decreased short-term memory Following Commands: Follows one step commands inconsistently;Follows one step commands with increased time Safety/Judgement: Decreased awareness of safety;Decreased awareness of deficits Awareness: Intellectual Problem Solving: Slow processing;Decreased initiation;Difficulty sequencing;Requires verbal cues;Requires tactile cues        General Comments      Exercises Other Exercises Other Exercises: Lateral lower to elbow with push back to midline - x5 to L (min assist), x5 to R (total assist). Noted improved sitting balance after exercise to the L.    Assessment/Plan    PT Assessment Patient needs continued PT services  PT Problem List Decreased strength;Decreased range of motion;Decreased activity tolerance;Decreased balance;Decreased mobility;Decreased coordination;Decreased cognition;Decreased knowledge of use of DME;Decreased safety awareness;Decreased knowledge of precautions;Impaired sensation;Impaired tone;Pain       PT Treatment Interventions DME instruction;Gait training;Stair training;Functional mobility training;Therapeutic exercise;Therapeutic activities;Neuromuscular re-education;Patient/family  education;Cognitive remediation    PT Goals (Current goals can be found in the Care Plan section)  Acute Rehab PT Goals Patient Stated Goal: None stated PT Goal Formulation: With patient/family Time For Goal Achievement: 11/11/17 Potential to Achieve Goals: Good    Frequency Min 3X/week   Barriers to discharge        Co-evaluation PT/OT/SLP Co-Evaluation/Treatment: Yes Reason for Co-Treatment: For patient/therapist safety;To address functional/ADL transfers PT goals addressed during session: Mobility/safety with mobility;Balance         AM-PAC PT "6 Clicks" Daily Activity  Outcome Measure Difficulty turning over in bed (including adjusting bedclothes, sheets and blankets)?: Unable Difficulty moving from lying on back to sitting on the side of the bed? : Unable Difficulty sitting down on and standing up from a chair with arms (e.g., wheelchair, bedside commode, etc,.)?: Unable Help needed moving to and from a bed to chair (including a wheelchair)?: Total Help needed walking in hospital room?: Total Help needed climbing 3-5 steps with a railing? : Total 6 Click Score: 6    End of Session   Activity Tolerance: Patient tolerated treatment well Patient left: in bed;with call bell/phone within reach;with family/visitor present Nurse Communication: Mobility status PT Visit Diagnosis: Hemiplegia and hemiparesis Hemiplegia - Right/Left: Right Hemiplegia - dominant/non-dominant: Dominant Hemiplegia - caused by: Nontraumatic intracerebral hemorrhage    Time: 1130-1158 PT Time Calculation (min) (ACUTE ONLY): 28 min   Charges:   PT Evaluation $PT Eval Moderate Complexity: 1 Mod     PT  G Codes:        Conni Slipper, PT, DPT Acute Rehabilitation Services Pager: [phone]   Marylynn Pearson 10/28/2017, 12:36 PM

## 2017-10-28 NOTE — Progress Notes (Signed)
PT Cancellation Note  Patient Details Name: [name] MRN: [id number] DOB: 04-Jul-1930   Cancelled Treatment:    Reason Eval/Treat Not Completed: Pt currently on bedrest. Will await increased activity orders prior to initiating PT eval.    [name] 10/28/2017, 7:07 AM   Conni SlipperLaura Derrin Currey, PT, DPT Acute Rehabilitation Services Pager: [phone]

## 2017-10-28 NOTE — Progress Notes (Signed)
OT Cancellation    10/28/17 0700  OT Visit Information  Last OT Received On 10/28/17  Reason Eval/Treat Not Completed Patient not medically ready;Active bedrest order. Pt with active bedrest orders. Will await increase in activity orders prior to initiating OT evaluation. Thank you!   Daryle Boyington MSOT, OTR/L Acute Rehab Pager: [phone] Office: [phone]

## 2017-10-28 NOTE — Progress Notes (Signed)
Modified Barium Swallow Progress Note  Patient Details  Name: [name] MRN: [id number] Date of Birth: 1929-11-23  Today's Date: 10/28/2017  Modified Barium Swallow completed.  Full report located under Chart Review in the Imaging Section.  Brief recommendations include the following:  Clinical Impression  Patient presents with moderate oral and severe pharyngeal dysphagia; I also suspect some chronic baseline dysphagia as his daughter reports pt has had difficulties eating for years. Oral stage characterized by weak lingual manipulation, decreased bolus cohesion, weak anterior to posterior transit and anterior bolus loss. Pharygneal stage is characterized by reduced tongue base retraction, decreased hyolaryngeal excursion, with incomplete epiglottic deflection and decreased laryngeal closure. This resulted in severe residue in the valleculae, penetration during and after the swallow with eventual aspiration, which was silent. Cued cough is weak, ineffective. With small cup sip of thin liquids, there is premature spillage directly into the airway, with silent aspiration during the swallow. After the exam, SLP discussed findings with pt, multiple family members. Unable to make recommendations for safe PO diet at this time due to likelihood of aspiration with all consistencies. Explained treatment options and fair prognosis given suspected baseline dysphagia. At this time family hopeful pt may make some improvements with interventions; consider cortrak for temporary means of nutrition. Will continue to follow.    Swallow Evaluation Recommendations       SLP Diet Recommendations: Alternative means - temporary;NPO;Ice chips PRN after oral care       Medication Administration: Via alternative means               Oral Care Recommendations: Oral care QID;Other (Comment)(oral care prior to ice chip)   Other Recommendations: Have oral suction available  Rondel BatonMary Beth Nawal Burling, MS,  CCC-SLP Speech-Language Pathologist [phone]  [name] 10/28/2017,4:03 PM

## 2017-10-28 NOTE — Evaluation (Signed)
Occupational Therapy Evaluation Patient Details Name: [name] MRN: [id number] DOB: Nov 12, 1929 Today's Date: 10/28/2017    History of Present Illness Pt is an 84 y/o male with a PMH significant for HTN, a-flutter, a-fib. As he was about to climb a ladder he suddenly slumped to one side and EMS was called. CT revealed large ICH L pons.    Clinical Impression   PTA, pt was living with his wife and was independent. Pt currently requiring Max-total A +2 for bed mobility and Max A for ADLs. Pt demonstrates decreased sitting balance requiring Max A for sitting EOB ~10 minutes and performed grooming and balance exercises. Pt presenting with poor balance, cognition, vision, and functional use of RUE (dominant hand) with no active movement. Pt will require further acute OT to facilitate safe dc. Recommend dc to SNF for further OT to optimize safety and independence with ADLs as well as return to PLOF.     Follow Up Recommendations  SNF;Supervision/Assistance - 24 hour    Equipment Recommendations  Other (comment)(Defer to next venue)    Recommendations for Other Services PT consult;Speech consult     Precautions / Restrictions Precautions Precautions: Fall Precaution Comments: R side weakness and inattention Restrictions Weight Bearing Restrictions: No      Mobility Bed Mobility Overal bed mobility: Needs Assistance Bed Mobility: Supine to Sit;Sit to Supine     Supine to sit: Max assist;+2 for physical assistance;HOB elevated Sit to supine: Total assist;+2 for physical assistance   General bed mobility comments: Pt initiating some movement of LE's to EOB, and was attempting to reach for L railing for support. Therapists used bed pad for scooting assist, and provided trunk support upon transition to sitting.   Transfers                 General transfer comment: Unable    Balance Overall balance assessment: Needs assistance Sitting-balance support: No upper extremity  supported;Feet supported Sitting balance-Leahy Scale: Zero Sitting balance - Comments: Pt sitting EOB with strong poterior lean noted at times. With UE support on the L, pt was able to assist in maintaining upright posture. When pt was attempting to wash face with L hand, total assist provided for truncal balance.  Postural control: Posterior lean;Right lateral lean                                 ADL either performed or assessed with clinical judgement   ADL Overall ADL's : Needs assistance/impaired Eating/Feeding: NPO   Grooming: Wash/dry face;Maximal assistance;Sitting Grooming Details (indicate cue type and reason): Pt sitting EOB and requiring Max A to maintain sitting. Pt able to cross midline to grasp wash cloth on right side with LUE. Pt with poor motor planning and present with dysmetria and ataxia.  Upper Body Bathing: Maximal assistance;Sitting;Bed level   Lower Body Bathing: Bed level;Maximal assistance   Upper Body Dressing : Maximal assistance;Sitting;Bed level   Lower Body Dressing: Bed level;Maximal assistance Lower Body Dressing Details (indicate cue type and reason): donned socks. Pt able to elevate heels off bed to assist in donning socks.               General ADL Comments: Pt demosntrating decreased funcitonal performance with poor sitting balance, flaccidity on RUE (dominant hand), poor vision, and decreased cognition.     Vision Baseline Vision/History: Wears glasses Wears Glasses: At all times Patient Visual Report: Diplopia Vision Assessment?: Yes;Vision  impaired- to be further tested in functional context Eye Alignment: Impaired (comment)(Eyes with periodic disalignment) Alignment/Gaze Preference: Gaze left Tracking/Visual Pursuits: Decreased smoothness of horizontal tracking;Decreased smoothness of vertical tracking;Requires cues, head turns, or add eye shifts to track;Unable to hold eye position out of midline;Impaired - to be further  tested in functional context Convergence: Impaired - to be further tested in functional context Visual Fickling: Other (comment)(Inattention to right side) Diplopia Assessment: Objects split on top of one another;Other (comment)(Difficult to assess due to cognition and speech) Depth Perception: Undershoots Additional Comments: Pt with poor tracking, reported diplopia, and unable to maintain gaze out of midline with quick fatigue.     Perception     Praxis      Pertinent Vitals/Pain Pain Assessment: Faces Faces Pain Scale: Hurts a little bit Pain Location: neck Pain Descriptors / Indicators: Aching;Discomfort Pain Intervention(s): Monitored during session;Limited activity within patient's tolerance;Repositioned     Hand Dominance Right   Extremity/Trunk Assessment Upper Extremity Assessment Upper Extremity Assessment: RUE deficits/detail RUE Deficits / Details: Brunnstrom stage 1 with no active movement in RUE.  RUE Coordination: decreased fine motor;decreased gross motor   Lower Extremity Assessment Lower Extremity Assessment: Defer to PT evaluation RLE Deficits / Details: Decreased strength and AROM noted bilaterally, however strength in RLE<LLE. Noted pt was initiating heel slide on the R when cued, and demonstrated ~2/5 strength in R quads.    Cervical / Trunk Assessment Cervical / Trunk Assessment: Kyphotic;Other exceptions Cervical / Trunk Exceptions: Forward head posture with rounded shoulders in sitting   Communication Communication Communication: Expressive difficulties;Other (comment)(Tendency to mumble)   Cognition Arousal/Alertness: Awake/alert Behavior During Therapy: WFL for tasks assessed/performed Overall Cognitive Status: Impaired/Different from baseline Area of Impairment: Attention;Memory;Following commands;Safety/judgement;Awareness;Problem solving                   Current Attention Level: Sustained Memory: Decreased short-term memory Following  Commands: Follows one step commands inconsistently;Follows one step commands with increased time Safety/Judgement: Decreased awareness of safety;Decreased awareness of deficits Awareness: Intellectual Problem Solving: Slow processing;Decreased initiation;Difficulty sequencing;Requires verbal cues;Requires tactile cues General Comments: Pt demonstrating decreased cognition. Pt requiring increased time and cues thorughout session and has poor awarness. Pt able to read analog clock with increased time.    General Comments  Daughter and wife present throughout session    Exercises Exercises: Other exercises Other Exercises Other Exercises: Lateral lower to elbow with push back to midline - x5 to L (min assist), x5 to R (total assist). Noted improved sitting balance after exercise to the L.    Shoulder Instructions      Home Living Family/patient expects to be discharged to:: Private residence Living Arrangements: Spouse/significant other Available Help at Discharge: Family;Available 24 hours/day(wife has dementia) Type of Home: House Home Access: Stairs to enter [employer] of Steps: 2   Home Layout: Two level;Able to live on main level with bedroom/bathroom;Laundry or work area in basement     [employer]: Producer, television/film/video: Standard     Home Equipment: Information systems manager - built in      Lives With: Spouse    Prior Functioning/Environment Level of Independence: Independent        Comments: Family reports pt very active PTA, independent and driving. Wife has dementia        OT Problem List: Decreased strength;Decreased range of motion;Decreased activity tolerance;Impaired balance (sitting and/or standing);Impaired vision/perception;Decreased coordination;Decreased cognition;Decreased safety awareness;Decreased knowledge of use of DME or AE;Decreased knowledge of precautions;Pain;Impaired UE  functional use      OT Treatment/Interventions:  Self-care/ADL training;Therapeutic exercise;Energy conservation;DME and/or AE instruction;Therapeutic activities;Patient/family education    OT Goals(Current goals can be found in the care plan section) Acute Rehab OT Goals Patient Stated Goal: Get better OT Goal Formulation: With patient Time For Goal Achievement: 11/11/17 Potential to Achieve Goals: Good ADL Goals Pt Will Perform Grooming: with min assist;sitting(supported sitting) Pt Will Perform Upper Body Dressing: with min assist;sitting Pt Will Transfer to Toilet: stand pivot transfer;bedside commode;with mod assist Additional ADL Goal #1: Pt will perform bed mobility with Min A +2 in preparation for ADLs in sitting  OT Frequency: Min 2X/week   Barriers to D/C: Other (comment)  Wife with dementia and daughter works full time       Co-evaluation PT/OT/SLP Co-Evaluation/Treatment: Yes Reason for Co-Treatment: For Academic librarianpatient/therapist safety PT goals addressed during session: Mobility/safety with mobility;Balance OT goals addressed during session: ADL's and self-care      AM-PAC PT "6 Clicks" Daily Activity     Outcome Measure Help from another person eating meals?: Total Help from another person taking care of personal grooming?: A Lot Help from another person toileting, which includes using toliet, bedpan, or urinal?: A Lot Help from another person bathing (including washing, rinsing, drying)?: A Lot Help from another person to put on and taking off regular upper body clothing?: A Lot Help from another person to put on and taking off regular lower body clothing?: A Lot 6 Click Score: 11   End of Session Nurse Communication: Mobility status;Precautions  Activity Tolerance: Patient tolerated treatment well;Patient limited by fatigue Patient left: in bed;with call bell/phone within reach;with bed alarm set;with family/visitor present  OT Visit Diagnosis: Unsteadiness on feet (R26.81);Other abnormalities of gait and mobility  (R26.89);Muscle weakness (generalized) (M62.81);Other symptoms and signs involving cognitive function;Pain;Hemiplegia and hemiparesis Hemiplegia - Right/Left: Right Hemiplegia - dominant/non-dominant: Dominant Hemiplegia - caused by: Cerebral infarction Pain - part of body: (Generalized)                Time: 1130-1157 OT Time Calculation (min): 27 min Charges:  OT General Charges $OT Visit: 1 Visit OT Evaluation $OT Eval Moderate Complexity: 1 Mod G-Codes:     Ayahna Solazzo MSOT, OTR/L Acute Rehab Pager: [phone] Office: [phone]  Theodoro GristCharis M Rael Yo 10/28/2017, 2:24 PM

## 2017-10-28 NOTE — Progress Notes (Signed)
STROKE TEAM PROGRESS NOTE   HISTORY OF PRESENT ILLNESS (per record) [name] is an 84 y.o. male with a PMH of HTN, HLD and AFIB on Coumadin brought into the ED this morning by EMS with reports of acute onset Right sided weakness,facial droop, dysarthria and left gaze preference. Patient was found down outside near his barn. Per reports her was just about to climb a ladder when he fell to the ground. B/P in the field was 168/82 and pulse of 118. Patient is able to tell us that he is compliant with his Coumadin. Family denies any recent illness, medication changes or hospitalizations.  His last INR check was 3.9 on 10/08/2017 per the medical record.   On admission the INR is 2.33 Initial Head CT shows large ICH Left pons IV Kcentra and vitamin K in progress  Date last known well: Date: 10/27/2017 Time last known well: Time: 09:25 tPA Given: No: ICH on Coumadin Modified Rankin: Rankin Score=0 NIHSS: 11     SUBJECTIVE (INTERVAL HISTORY) Family members present. The patient follows most commands. He appears to be oriented with some cueing. He has a dense right hemiplegia.  His blood pressure has been adequately controlled even off any medications.  His family informs me that he does take blood pressure medications at home but his blood pressure has never been difficult to control.    OBJECTIVE Temp:  [97.5 F (36.4 C)-98.3 F (36.8 C)] 98 F (36.7 C) (03/03 0800) Pulse Rate:  [47-130] 61 (03/03 0900) Cardiac Rhythm: Atrial fibrillation (03/03 0800) Resp:  [12-29] 17 (03/03 0900) BP: (87-161)/(52-106) 126/72 (03/03 0900) SpO2:  [90 %-98 %] 96 % (03/03 0900) Weight:  [166 lb 14.2 oz (75.7 kg)-177 lb 14.6 oz (80.7 kg)] 166 lb 14.2 oz (75.7 kg) (03/02 1210)  CBC:  Recent Labs  Lab 10/27/17 1038 10/27/17 1041  WBC 7.5  --   NEUTROABS 3.8  --   HGB 15.7 16.0  HCT 45.5 47.0  MCV 99.6  --   PLT 204  --     Basic Metabolic Panel:  Recent Labs  Lab 10/27/17 1038  10/27/17 1041 10/28/17 0204  NA 137 139 136  K 4.3 4.2 4.0  CL 100* 101 101  CO2 24  --  23  GLUCOSE 133* 129* 125*  BUN 16 19 16   CREATININE 1.31* 1.30* 1.17  CALCIUM 9.8  --  9.5    Lipid Panel:     Component Value Date/Time   CHOL 154 10/27/2017 1240   TRIG 105 10/27/2017 1240   HDL 42 10/27/2017 1240   CHOLHDL 3.7 10/27/2017 1240   VLDL 21 10/27/2017 1240   LDLCALC 91 10/27/2017 1240   HgbA1c:  Lab Results  Component Value Date   HGBA1C 5.8 (H) 10/27/2017   Urine Drug Screen: No results found for: LABOPIA, COCAINSCRNUR, LABBENZ, AMPHETMU, THCU, LABBARB  Alcohol Level No results found for: ETH  IMAGING   Ct Head Wo Contrast 10/27/2017  IMPRESSION:  1. Acute hemorrhage in the left pons is stable since 1045 hours today, blood volume estimated at 2 mL.  2. No new intracranial abnormality.      Ct Head Code Stroke Wo Contrast 10/27/2017 IMPRESSION:  1. Acute hemorrhage within the left pons, relatively large at up to 20 mm diameter (estimated blood volume 2 mL). No extra-axial or intraventricular extension at this time. No significant intracranial mass effect.  No ventriculomegaly.  2. ASPECTS is not applicable, acute hemorrhage.    Swallowing Study and  Radiology - pending    PHYSICAL EXAM Vitals:   10/28/17 0630 10/28/17 0700 10/28/17 0800 10/28/17 0900  BP: (!) 119/52 (!) 107/55 99/79 126/72  Pulse: 77 61 (!) 105 61  Resp: 20 16 14 17   Temp:   98 F (36.7 C)   TempSrc:   Axillary   SpO2: 94% 95% 94% 96%  Weight:      Height:       Pleasant frail  elderly Caucasian male currently not in distress. . Afebrile. Head is nontraumatic. Neck is supple without bruit.    Cardiac exam no murmur or gallop. Lungs are clear to auscultation. Distal pulses are well felt.  Neurological Exam :  Awake alert oriented x3.  Severe dysarthria but can be understood with some difficulty.  Left gaze preference and able to look to the right only till midline.  Blinks to threat  on the left but not so on the right.  Follows commands well.  Right lower facial weakness.  Tongue midline.  Motor system exam shows dense right hemiplegia with only trace withdrawal to painful stimuli in the right upper and lower extremity.  Purposeful antigravity strength on the left without weakness.  Diminished sensation on the right hemibody compared to the left.  Tone is diminished on the right compared to the left.  Right plantar upgoing left downgoing.  Gait not tested.   HOME MEDICATIONS:  Medications Prior to Admission  Medication Sig Dispense Refill  . atorvastatin (LIPITOR) 20 MG tablet Take 1 tablet (20 mg total) by mouth daily. 90 tablet 3  . Calcium Carbonate-Vitamin D (CALCIUM 600+D3 PO) Take by mouth. TAKE ONE TABLET DAILY    . CARTIA XT 120 MG 24 hr capsule Take 120 capsules by mouth daily.    . cholecalciferol (VITAMIN D) 1000 UNITS tablet Take 1,000 Units by mouth daily.    . metoprolol succinate (TOPROL-XL) 100 MG 24 hr tablet TAKE ONE TABLET BY MOUTH ONCE DAILY WITH OR IMMEDIATELY FOLLOWING A MEAL 90 tablet 3  . Multiple Vitamins-Minerals (MULTIVITAMIN WITH MINERALS) tablet Take 1 tablet by mouth daily.    . Omega-3 Fatty Acids (FISH OIL) 1000 MG CAPS Take by mouth. Take one daily    . warfarin (COUMADIN) 5 MG tablet TAKE BY MOUTH AS DIRECTED BY COUMADIN CLINIC 90 tablet 1  . diltiazem (CARDIZEM CD) 120 MG 24 hr capsule Take 1 capsule (120 mg total) by mouth daily. 90 capsule 3  . losartan-hydrochlorothiazide (HYZAAR) 100-12.5 MG tablet Take 1 tablet by mouth daily. (Patient not taking: Reported on 10/27/2017) 90 tablet 2      HOSPITAL MEDICATIONS:  . pantoprazole (PROTONIX) IV  40 mg Intravenous QHS  . senna-docusate  1 tablet Oral BID    ALLERGIES No Known Allergies  PAST MEDICAL HISTORY Past Medical History:  Diagnosis Date  . Atrial fibrillation (HCC)   . Atrial flutter (HCC)   . Atrial flutter (HCC)   . Edema 03/17/2009   Qualifier: Diagnosis of  By: Flonnie OvermanMcLean,  Monica    . GERD (gastroesophageal reflux disease)   . HTN (hypertension)   . Hypercholesteremia   . Hyperlipidemia      SOCIAL HISTORY  reports that  has never smoked. he has never used smokeless tobacco. He reports that he does not drink alcohol or use drugs.  ASSESSMENT/PLAN [name] is a 84 y.o. male with history of atrial fibrillation on Coumadin, hypertension and hyperlipidemia, presenting with acute onset right-sided weakness, facial droop, dysarthria, and left gaze  preference. 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 Etiology indeterminate underlying cavernoma versus hypertensive plus warfarin related coagulopathy  Resultant left gaze preference dysarthria right facial droop and right hemiplegia  CT head - acute hemorrhage within the left pons  MRI head - not performed  MRA head - not performed  Carotid Doppler - not indicated  2D Echo - not performed  LDL - 91  HgbA1c - 5.8  VTE prophylaxis - SCDs Diet NPO time specified Fall precautions  warfarin daily prior to admission, now on No antithrombotic  Ongoing aggressive stroke risk factor management  Therapy recommendations:  pending  Disposition:  Pending  Hypertension  Blood pressure tends to run low  Permissive hypertension (OK if < 220/120) but gradually normalize in 5-7 days  Long-term BP goal normotensive  Hyperlipidemia  Home meds: Lipitor 20 mg daily prior to admission not resumed in hospital  LDL 91, goal < 70  Resume statin at discharge   Other Stroke Risk Factors  Advanced age  Atrial fibrillation   Other Active Problems     Plan / Recommendations   Transfer out of the unit - mobilize  Systolic blood pressure goal less than 14024 hours - then less than 180.   Hospital day # 1  Delton See PA-C [facility] Pager [phone] 10/28/2017, 1:09 PM I have personally examined this  patient, reviewed notes, independently viewed imaging studies, participated in medical decision making and plan of care.ROS completed by me personally and pertinent positives fully documented  I have made any additions or clarifications directly to the above note. Agree with note above.  He presented with dysarthria and right hemiplegia due to left pontine hemorrhage unclear whether he has underlying cavernoma versus hypertensive small vessel disease with superimposed warfarin coagulopathy.  Continue strict control of hypertension and his INR has already been normalized.  Mobilize out of bed.  Therapy and rehab consults.  Speech therapy to do swallow eval.  Long discussion at the bedside with the patient's wife, daughter and answered questions about his care. This patient is critically ill and at significant risk of neurological worsening, death and care requires constant monitoring of vital signs, hemodynamics,respiratory and cardiac monitoring, extensive review of multiple databases, frequent neurological assessment, discussion with family, other specialists and medical decision making of high complexity.I have made any additions or clarifications directly to the above note.This critical care time does not reflect procedure time, or teaching time or supervisory time of PA/NP/Med Resident etc but could involve care discussion time.  I spent 40 minutes of neurocritical care time  in the care of  this patient.      Delia Heady, MD Medical Director [facility] Pager: [phone] 10/28/2017 2:46 PM   To contact Stroke Continuity provider, please refer to [email or web address]. After hours, contact General Neurology

## 2017-10-29 ENCOUNTER — Inpatient Hospital Stay (HOSPITAL_COMMUNITY): Payer: Medicare HMO

## 2017-10-29 LAB — PROTIME-INR
INR: 1.15
Prothrombin Time: 14.7 seconds (ref 11.4–15.2)

## 2017-10-29 LAB — GLUCOSE, CAPILLARY: Glucose-Capillary: 131 mg/dL — ABNORMAL HIGH (ref 65–99)

## 2017-10-29 LAB — PHOSPHORUS: Phosphorus: 2 mg/dL — ABNORMAL LOW (ref 2.5–4.6)

## 2017-10-29 MED ORDER — METOPROLOL TARTRATE 12.5 MG HALF TABLET
12.5000 mg | ORAL_TABLET | Freq: Two times a day (BID) | ORAL | Status: DC
  Administered 2017-10-29 – 2017-10-30 (×4): 12.5 mg via ORAL
  Filled 2017-10-29 (×4): qty 1

## 2017-10-29 MED ORDER — PRO-STAT SUGAR FREE PO LIQD
30.0000 mL | Freq: Every day | ORAL | Status: DC
  Administered 2017-10-30 – 2017-11-07 (×9): 30 mL
  Filled 2017-10-29 (×9): qty 30

## 2017-10-29 MED ORDER — JEVITY 1.2 CAL PO LIQD
1000.0000 mL | ORAL | Status: DC
  Administered 2017-10-29: 1000 mL
  Filled 2017-10-29 (×3): qty 1000

## 2017-10-29 MED ORDER — METOPROLOL TARTRATE 5 MG/5ML IV SOLN
2.5000 mg | INTRAVENOUS | Status: DC | PRN
  Administered 2017-10-29 – 2017-10-31 (×8): 2.5 mg via INTRAVENOUS
  Filled 2017-10-29 (×9): qty 5

## 2017-10-29 NOTE — Progress Notes (Signed)
Patient attempted telling me his name. Said he was at home. Is now reaching more with Left arm.

## 2017-10-29 NOTE — Progress Notes (Signed)
BP parameters: SBP goal < 140, DBP goal < 90  HR parameters: HR <110  Metoprolol 2.5 mg IV q4h PRN tachycardia has been ordered.   Scheduled metoprolol also ordered, which should be administered once his Cortrak is placed (has failed swallow eval)  Electronically signed: Dr. Caryl PinaEric Cyntha Brickman

## 2017-10-29 NOTE — Progress Notes (Signed)
The family told the nurse that they have agreed to make the patient a DNR.   Nurse contacted MD Pearlean Brownie(Sethi) he said he would put the order in.

## 2017-10-29 NOTE — Progress Notes (Signed)
STROKE TEAM PROGRESS NOTE   HISTORY OF PRESENT ILLNESS (per record) [name] is an 84 y.o. male with a PMH of HTN, HLD and AFIB on Coumadin brought into the ED this morning by EMS with reports of acute onset Right sided weakness,facial droop, dysarthria and left gaze preference. Patient was found down outside near his barn. Per reports her was just about to climb a ladder when he fell to the ground. B/P in the field was 168/82 and pulse of 118. Patient is able to tell us that he is compliant with his Coumadin. Family denies any recent illness, medication changes or hospitalizations.  His last INR check was 3.9 on 10/08/2017 per the medical record.   On admission the INR is 2.33 Initial Head CT shows large ICH Left pons IV Kcentra and vitamin K in progress  Date last known well: Date: 10/27/2017 Time last known well: Time: 09:25 tPA Given: No: ICH on Coumadin Modified Rankin: Rankin Score=0 NIHSS: 11     SUBJECTIVE (INTERVAL HISTORY) His wife and son and daughter are present. The patient follows most commands. He appears to be oriented with some cueing. He has a dense right hemiplegia.  His blood pressure has been adequately controlled  failed swallow eval and has a core tract tube placed and tube feeds will be started today. Therapist feel he will need skilled nursing facility   OBJECTIVE Temp:  [97.6 F (36.4 C)-98 F (36.7 C)] 98 F (36.7 C) (03/04 0400) Pulse Rate:  [93-131] 131 (03/04 0925) Cardiac Rhythm: Atrial fibrillation (03/04 0700) Resp:  [20-21] 21 (03/04 0400) BP: (121-171)/(64-142) 121/64 (03/04 0934) SpO2:  [95 %-96 %] 96 % (03/04 0400)  CBC:  Recent Labs  Lab 10/27/17 1038 10/27/17 1041  WBC 7.5  --   NEUTROABS 3.8  --   HGB 15.7 16.0  HCT 45.5 47.0  MCV 99.6  --   PLT 204  --     Basic Metabolic Panel:  Recent Labs  Lab 10/27/17 1038 10/27/17 1041 10/28/17 0204  NA 137 139 136  K 4.3 4.2 4.0  CL 100* 101 101  CO2 24  --  23  GLUCOSE  133* 129* 125*  BUN 16 19 16   CREATININE 1.31* 1.30* 1.17  CALCIUM 9.8  --  9.5    Lipid Panel:     Component Value Date/Time   CHOL 154 10/27/2017 1240   TRIG 105 10/27/2017 1240   HDL 42 10/27/2017 1240   CHOLHDL 3.7 10/27/2017 1240   VLDL 21 10/27/2017 1240   LDLCALC 91 10/27/2017 1240   HgbA1c:  Lab Results  Component Value Date   HGBA1C 5.8 (H) 10/27/2017   Urine Drug Screen: No results found for: LABOPIA, COCAINSCRNUR, LABBENZ, AMPHETMU, THCU, LABBARB  Alcohol Level No results found for: ETH  IMAGING   Ct Head Wo Contrast 10/27/2017  IMPRESSION:  1. Acute hemorrhage in the left pons is stable since 1045 hours today, blood volume estimated at 2 mL.  2. No new intracranial abnormality.      Ct Head Code Stroke Wo Contrast 10/27/2017 IMPRESSION:  1. Acute hemorrhage within the left pons, relatively large at up to 20 mm diameter (estimated blood volume 2 mL). No extra-axial or intraventricular extension at this time. No significant intracranial mass effect.  No ventriculomegaly.  2. ASPECTS is not applicable, acute hemorrhage.    Swallowing Study and Radiology - 10/28/16 failed per speech therapy    PHYSICAL EXAM Vitals:   10/29/17 [id number] 10/29/17 [id number]  10/29/17 0925 10/29/17 0934  BP: 124/82 (!) 164/123 (!) 171/133 121/64  Pulse: (!) 124 (!) 129 (!) 131   Resp:      Temp:      TempSrc:      SpO2:      Weight:      Height:       Pleasant frail  elderly Caucasian male currently not in distress. . Afebrile. Head is nontraumatic. Neck is supple without bruit.    Cardiac exam no murmur or gallop. Lungs are clear to auscultation. Distal pulses are well felt.  Neurological Exam :  Awake alert oriented x3.  Severe dysarthria but can be understood with some difficulty.  Left gaze preference and able to look to the right only till midline.  Blinks to threat on the left but not so on the right.  Follows commands well.  Right lower facial weakness.  Tongue midline.  Motor  system exam shows dense right hemiplegia with only trace withdrawal to painful stimuli in the right upper and lower extremity.  Purposeful antigravity strength on the left without weakness.  Diminished sensation on the right hemibody compared to the left.  Tone is diminished on the right compared to the left.  Right plantar upgoing left downgoing.  Gait not tested.   HOME MEDICATIONS:  Medications Prior to Admission  Medication Sig Dispense Refill  . atorvastatin (LIPITOR) 20 MG tablet Take 1 tablet (20 mg total) by mouth daily. 90 tablet 3  . Calcium Carbonate-Vitamin D (CALCIUM 600+D3 PO) Take by mouth. TAKE ONE TABLET DAILY    . CARTIA XT 120 MG 24 hr capsule Take 120 capsules by mouth daily.    . cholecalciferol (VITAMIN D) 1000 UNITS tablet Take 1,000 Units by mouth daily.    . metoprolol succinate (TOPROL-XL) 100 MG 24 hr tablet TAKE ONE TABLET BY MOUTH ONCE DAILY WITH OR IMMEDIATELY FOLLOWING A MEAL 90 tablet 3  . Multiple Vitamins-Minerals (MULTIVITAMIN WITH MINERALS) tablet Take 1 tablet by mouth daily.    . Omega-3 Fatty Acids (FISH OIL) 1000 MG CAPS Take by mouth. Take one daily    . warfarin (COUMADIN) 5 MG tablet TAKE BY MOUTH AS DIRECTED BY COUMADIN CLINIC 90 tablet 1  . diltiazem (CARDIZEM CD) 120 MG 24 hr capsule Take 1 capsule (120 mg total) by mouth daily. 90 capsule 3  . losartan-hydrochlorothiazide (HYZAAR) 100-12.5 MG tablet Take 1 tablet by mouth daily. (Patient not taking: Reported on 10/27/2017) 90 tablet 2      HOSPITAL MEDICATIONS:  . metoprolol tartrate  12.5 mg Oral BID  . pantoprazole (PROTONIX) IV  40 mg Intravenous QHS  . senna-docusate  1 tablet Oral BID    ALLERGIES No Known Allergies  PAST MEDICAL HISTORY Past Medical History:  Diagnosis Date  . Atrial fibrillation (HCC)   . Atrial flutter (HCC)   . Atrial flutter (HCC)   . Edema 03/17/2009   Qualifier: Diagnosis of  By: Flonnie Overman    . GERD (gastroesophageal reflux disease)   . HTN  (hypertension)   . Hypercholesteremia   . Hyperlipidemia      SOCIAL HISTORY  reports that  has never smoked. he has never used smokeless tobacco. He reports that he does not drink alcohol or use drugs.  ASSESSMENT/PLAN [name] is a 84 y.o. male with history of atrial fibrillation on Coumadin, hypertension and hyperlipidemia, presenting with acute onset right-sided weakness, facial droop, dysarthria, and left gaze preference. He did not receive IV t-PA  due to ICH  Left pontine ICH - on Coumadin with INR 2.33 on admission. Etiology indeterminate underlying cavernoma versus hypertensive plus warfarin related coagulopathy  Resultant left gaze preference dysarthria right facial droop and right hemiplegia  CT head - acute hemorrhage within the left pons  MRI head - not performed  MRA head - not performed  Carotid Doppler - not indicated  2D Echo - not performed  LDL - 91  HgbA1c - 5.8  VTE prophylaxis - SCDs Diet NPO time specified Fall precautions  warfarin daily prior to admission, now on No antithrombotic  Ongoing aggressive stroke risk factor management  Therapy recommendations:  pending  Disposition:  Pending  Hypertension  Blood pressure tends to run low  Permissive hypertension (OK if < 220/120) but gradually normalize in 5-7 days  Long-term BP goal normotensive  Hyperlipidemia  Home meds: Lipitor 20 mg daily prior to admission not resumed in hospital  LDL 91, goal < 70  Resume statin at discharge   Other Stroke Risk Factors  Advanced age  Atrial fibrillation   Other Active Problems     Plan / Recommendations   Started using tube feeds.- mobilize  Systolic blood pressure goal less than   180.  Speech therapy to continue to follow dysphagia  Likely need skilled nursing home been able to swallow or has a PEG   Hospital day # 2    I have personally examined this patient, reviewed notes, independently viewed imaging  studies, participated in medical decision making and plan of care.ROS completed by me personally and pertinent positives fully documented  I have made any additions or clarifications directly to the above note. I had a long discussion with the patient's wife, son and daughter about his prognosis. If his dysphagia does not improve he may need PEG tube for long-term nutrition. We also discussed about CODE STATUS and consideration for DO NOT RESUSCITATE and family needs some time to make a decision on this.G greater than 50% time during this 25 minute visit was spent on counseling and coordination of care about his dysphagia and pontine hemorrhage and answering questions.G reater than 50% time during this 25 minute visit was spent on counselling and coordination of care about his stroke, dysphagia and afib and answering questions. Delia Heady, MD Medical Director [facility] Pager: [phone] 10/29/2017 2:28 PM   To contact Stroke Continuity provider, please refer to [email or web address]. After hours, contact General Neurology

## 2017-10-29 NOTE — Progress Notes (Signed)
Patients BP elevated. 171/133. Labetalol given. After 10 minutes BP down to 121/64. Nurse will continue to monitor.

## 2017-10-29 NOTE — Progress Notes (Signed)
Initial Nutrition Assessment  DOCUMENTATION CODES:   Not applicable  INTERVENTION:  Continue Jevity 1.2 at 7850mL/hr  Recommend check Mg, Phos, K+ for at least 3 days, unclear weight and PO history  If stable, increase Jevity 1.2 to 8760mL/hr  Pro-stat 30mL daily  At goal, provides 1828 calories, 95 grams protein, 1162mL free water  Additional free water per MD  NUTRITION DIAGNOSIS:   Inadequate oral intake related to dysphagia as evidenced by NPO status  GOAL:   Patient will meet greater than or equal to 90% of their needs  MONITOR:   Labs, Skin, I & O's, Weight trends, TF tolerance  REASON FOR ASSESSMENT:   New TF    ASSESSMENT:   [name] is a 84 y.o. male with history of atrial fibrillation on Coumadin, hypertension and hyperlipidemia, presenting with acute onset right-sided weakness, facial droop, dysarthria, and left gaze preference. He did not receive IV t-PA due to ICH  Spoke with family at bedside. Wife states he was eating well PTA, 3 meals a day, would normally eat cereal for breakfast, a sandwich for lunch and a chocolate shake with carnation instant breakfast in it for dinner. Unclear if he was meeting his needs. Family reports a 40-50 pound weight loss over the past year, but per chart there is no evidence of weight loss, seems to have been stable over 2 years.    NUTRITION - FOCUSED PHYSICAL EXAM:    Most Recent Value  Orbital Region  Mild depletion  Upper Arm Region  No depletion  Thoracic and Lumbar Region  No depletion  Buccal Region  Mild depletion  Temple Region  Moderate depletion  Clavicle Bone Region  Mild depletion  Clavicle and Acromion Bone Region  No depletion  Scapular Bone Region  Unable to assess  Dorsal Hand  Unable to assess  Patellar Region  No depletion  Anterior Thigh Region  No depletion  Posterior Calf Region  No depletion  Edema (RD Assessment)  Mild  Hair  Reviewed  Eyes  Reviewed  Mouth  Reviewed  Skin   Reviewed  Nails  Reviewed       Diet Order:  Diet NPO time specified Fall precautions  EDUCATION NEEDS:   Not appropriate for education at this time  Skin:  Skin Assessment: Reviewed RN Assessment  Last BM:  3/2  Height:   Ht Readings from Last 1 Encounters:  10/27/17 5\' 10"  (1.778 m)    Weight:   Wt Readings from Last 1 Encounters:  10/27/17 166 lb 14.2 oz (75.7 kg)    Ideal Body Weight:  75.45 kg  BMI:  Body mass index is 23.95 kg/m.  Estimated Nutritional Needs:   Kcal:  1750-2000 calories  Protein:  90-113 grams (1.2-1.5g/kg)  Fluid:  1.75-2.0L  Dionne AnoWilliam M. Juelz Whittenberg, MS, RD LDN Inpatient Clinical Dietitian Pager [phone]

## 2017-10-29 NOTE — Significant Event (Signed)
Rapid Response Event Note  Overview: While rounding, stopped to see [name] regarding elevated HR and RN mentioned pt needed suctioning      Initial Focused Assessment:  [name] is arousable to loud voice, dysarthric and will not cough to command.  HR afib in the 130s, BP stable, RR 15-20 with RA Sats 90%.  Breaths sounds rhonchi bilaterally with congested upper airway sounds.  His skin is warm, dry and pink.  Per RN, pt's lung sounds are more rhonchorous since tube feeding initiated.   Interventions: -Pulmonary toilet with oral suctioning by RT Alecia LemmingVictor. RA Sats increased to 96%. -CXR  -Held TF for now    Plan of Care (if not transferred): -Contracted with Hin RN to contact MD aspiration concern and HR management and TF status.           Rose Fillersees, Monika Chestang W

## 2017-10-29 NOTE — Progress Notes (Signed)
Cortrak Tube Team Note:  Consult received to place a Cortrak feeding tube.   A 10 F Cortrak tube was placed in the left nare and secured with a nasal bridle at 82 cm. Per the Cortrak monitor reading the tube tip is in the pyloric region.   No x-ray is required. RN may begin using tube.   If the tube becomes dislodged please keep the tube and contact the Cortrak team at [email or web address] ([email or web address]) for replacement.  If after hours and replacement cannot be delayed, place a NG tube and confirm placement with an abdominal x-ray.    Vanessa Kickarly Samhitha Rosen RD, LDN Clinical Nutrition Pager # [phone]

## 2017-10-29 NOTE — Care Management Note (Signed)
Case Management Note  Patient Details  Name: [name] MRN: [id number] Date of Birth: 03-Aug-1930  Subjective/Objective:    Pt admitted with ICH. He is from home with spouse.                 Action/Plan: Pt had Cortrak placed this am. Recommendations are for SNF. Pt will need PEG or able to swallow for SNF placement. CM following.   Expected Discharge Date:                  Expected Discharge Plan:  Skilled Nursing Facility  In-House Referral:  Clinical Social Work  Discharge planning Services     Post Acute Care Choice:    Choice offered to:     DME Arranged:    DME Agency:     HH Arranged:    HH Agency:     Status of Service:  In process, will continue to follow  If discussed at Long Length of Stay Meetings, dates discussed:    Additional Comments:  Kermit BaloKelli F Ilaisaane Marts, RN 10/29/2017, 11:47 AM

## 2017-10-29 NOTE — [place] FL2 (Addendum)
[place] MEDICAID FL2 LEVEL OF CARE SCREENING TOOL     IDENTIFICATION  Patient Name: [name] Birthdate: 06-12-1930 Sex: male Admission Date (Current Location): 10/27/2017  [facility] and [place] Number:  Producer, television/film/video and Address:  [facility] [place], [address] [place], [place] [address]      Provider Number: [id number]  Attending Physician Name and Address:  Micki Riley, MD  Relative Name and Phone Number:       Current Level of Care: Hospital Recommended Level of Care: Skilled Nursing Facility Prior Approval Number:    Date Approved/Denied:   PASRR Number: [id number]  Discharge Plan: SNF    Current Diagnoses: Patient Active Problem List   Diagnosis Date Noted  . ICH (intracerebral hemorrhage) (HCC) 10/27/2017  . Chronic anticoagulation 11/19/2016  . Right bundle branch block 09/08/2014  . Encounter for therapeutic drug monitoring 11/24/2013  . Atrial fibrillation (HCC) 06/30/2013  . Weight loss 06/03/2013    Class: Chronic  . Hyperlipidemia   . Essential hypertension 03/17/2009  . ATRIAL FLUTTER 03/17/2009  . Other specified cardiac dysrhythmias(427.89) 03/17/2009  . EDEMA 03/17/2009    Orientation RESPIRATION BLADDER Height & Weight     (disoriented)  Normal Incontinent, External catheter(catheter placed 10/28/17) Weight: 166 lb 14.2 oz (75.7 kg) Height:  5\' 10"  (177.8 cm)  BEHAVIORAL SYMPTOMS/MOOD NEUROLOGICAL BOWEL NUTRITION STATUS      Incontinent Diet(see DC summary)  AMBULATORY STATUS COMMUNICATION OF NEEDS Skin   Extensive Assist Verbally Normal                       Personal Care Assistance Level of Assistance  Bathing, Feeding, Dressing Bathing Assistance: Maximum assistance Feeding assistance: Limited assistance Dressing Assistance: Maximum assistance     Functional Limitations Info  Sight, Hearing, Speech Sight Info: Adequate Hearing Info: Adequate Speech Info: Impaired(dysarthria)     SPECIAL CARE FACTORS FREQUENCY  PT (By licensed PT), OT (By licensed OT), Speech therapy     PT Frequency: 5x/wk OT Frequency: 5x/wk     Speech Therapy Frequency: 5x/wk      Contractures Contractures Info: Not present    Additional Factors Info  Code Status, Allergies Code Status Info: Full Allergies Info: NKA           Current Medications (10/29/2017):  This is the current hospital active medication list Current Facility-Administered Medications  Medication Dose Route Frequency Provider Last Rate Last Dose  . 0.9 %  sodium chloride infusion   Intravenous Continuous Beryl Meager, NP 50 mL/hr at 10/29/17 0945    . acetaminophen (TYLENOL) tablet 650 mg  650 mg Oral Q4H PRN Aroor, Dara Lords, MD       Or  . acetaminophen (TYLENOL) solution 650 mg  650 mg Per Tube Q4H PRN Aroor, Dara Lords, MD       Or  . acetaminophen (TYLENOL) suppository 650 mg  650 mg Rectal Q4H PRN Aroor, Georgiana Spinner R, MD      . feeding supplement (JEVITY 1.2 CAL) liquid 1,000 mL  1,000 mL Per Tube Continuous Annie Main L, NP 50 mL/hr at 10/29/17 1444 1,000 mL at 10/29/17 1444  . labetalol (NORMODYNE,TRANDATE) injection 10 mg  10 mg Intravenous Q10 min PRN Aroor, Dara Lords, MD   10 mg at 10/29/17 1455  . metoprolol tartrate (LOPRESSOR) injection 2.5 mg  2.5 mg Intravenous Q4H PRN Caryl Pina, MD   2.5 mg at 10/29/17 0532  . metoprolol tartrate (LOPRESSOR)  tablet 12.5 mg  12.5 mg Oral BID Caryl PinaLindzen, Eric, MD   12.5 mg at 10/29/17 1320  . pantoprazole (PROTONIX) injection 40 mg  40 mg Intravenous QHS Aroor, Dara LordsSushanth R, MD   40 mg at 10/28/17 2218  . senna-docusate (Senokot-S) tablet 1 tablet  1 tablet Oral BID Aroor, Dara LordsSushanth R, MD   1 tablet at 10/29/17 1320     Discharge Medications: Please see discharge summary for a list of discharge medications.  Relevant Imaging Results:  Relevant Lab Results:   Additional Information SS#: [id number]  Baldemar LenisElizabeth M Paisley, [name]  I have personally  examined this patient, reviewed notes, independently viewed imaging studies, participated in medical decision making and plan of care.ROS completed by me personally and pertinent positives fully documented  I have made any additions or clarifications directly to the above note. Agree with note above.   Delia HeadyPramod Versa Craton, MD Medical Director [facility] Pager: [phone] 10/29/2017 4:47 PM

## 2017-10-30 LAB — GLUCOSE, CAPILLARY
GLUCOSE-CAPILLARY: 148 mg/dL — AB (ref 65–99)
GLUCOSE-CAPILLARY: 128 mg/dL — AB (ref 65–99)
Glucose-Capillary: 130 mg/dL — ABNORMAL HIGH (ref 65–99)
Glucose-Capillary: 135 mg/dL — ABNORMAL HIGH (ref 65–99)
Glucose-Capillary: 139 mg/dL — ABNORMAL HIGH (ref 65–99)
Glucose-Capillary: 144 mg/dL — ABNORMAL HIGH (ref 65–99)
Glucose-Capillary: 146 mg/dL — ABNORMAL HIGH (ref 65–99)

## 2017-10-30 LAB — MAGNESIUM: MAGNESIUM: 2.1 mg/dL (ref 1.7–2.4)

## 2017-10-30 LAB — PROTIME-INR
INR: 1.26
Prothrombin Time: 15.7 seconds — ABNORMAL HIGH (ref 11.4–15.2)

## 2017-10-30 LAB — PHOSPHORUS: Phosphorus: 2.7 mg/dL (ref 2.5–4.6)

## 2017-10-30 MED ORDER — SENNOSIDES 8.8 MG/5ML PO SYRP
5.0000 mL | ORAL_SOLUTION | Freq: Two times a day (BID) | ORAL | Status: DC
  Administered 2017-10-31 – 2017-11-07 (×11): 5 mL
  Filled 2017-10-30 (×17): qty 5

## 2017-10-30 MED ORDER — DOCUSATE SODIUM 50 MG/5ML PO LIQD
50.0000 mg | Freq: Two times a day (BID) | ORAL | Status: DC
  Administered 2017-10-31 – 2017-11-07 (×14): 50 mg
  Filled 2017-10-30 (×14): qty 10

## 2017-10-30 MED ORDER — METOPROLOL TARTRATE 5 MG/5ML IV SOLN
2.5000 mg | Freq: Once | INTRAVENOUS | Status: AC
  Administered 2017-10-30: 2.5 mg via INTRAVENOUS
  Filled 2017-10-30: qty 5

## 2017-10-30 MED ORDER — METOPROLOL TARTRATE 25 MG/10 ML ORAL SUSPENSION
12.5000 mg | Freq: Two times a day (BID) | ORAL | Status: DC
  Filled 2017-10-30 (×2): qty 5

## 2017-10-30 MED ORDER — JEVITY 1.2 CAL PO LIQD
1000.0000 mL | ORAL | Status: DC
  Administered 2017-10-30 – 2017-11-03 (×3): 1000 mL
  Filled 2017-10-30 (×9): qty 1000

## 2017-10-30 NOTE — Progress Notes (Signed)
STROKE TEAM PROGRESS NOTE   HISTORY OF PRESENT ILLNESS (per record) [name] is an 84 y.o. male with a PMH of HTN, HLD and AFIB on Coumadin brought into the ED this morning by EMS with reports of acute onset Right sided weakness,facial droop, dysarthria and left gaze preference. Patient was found down outside near his barn. Per reports her was just about to climb a ladder when he fell to the ground. B/P in the field was 168/82 and pulse of 118. Patient is able to tell us that he is compliant with his Coumadin. Family denies any recent illness, medication changes or hospitalizations.  His last INR check was 3.9 on 10/08/2017 per the medical record.   On admission the INR is 2.33 Initial Head CT shows large ICH Left pons IV Kcentra and vitamin K in progress  Date last known well: Date: 10/27/2017 Time last known well: Time: 09:25 tPA Given: No: ICH on Coumadin Modified Rankin: Rankin Score=0 NIHSS: 11     SUBJECTIVE (INTERVAL HISTORY) His wife and son  are present. The patient follows most commands. He appears to be oriented with some cueing. He has a dense right hemiplegia.  His blood pressure has been adequately controlled  failed swallow eval and has a core tract tube placed and tube feeds have been  started   OBJECTIVE Temp:  [98.8 F (37.1 C)-99.2 F (37.3 C)] 98.8 F (37.1 C) (03/05 0424) Pulse Rate:  [118-131] 118 (03/05 1031) Cardiac Rhythm: Atrial fibrillation;Bundle branch block (03/05 0810) Resp:  [19-22] 19 (03/05 0849) BP: (108-149)/(80-100) 129/80 (03/05 1031) SpO2:  [95 %-98 %] 95 % (03/05 0849) Weight:  [165 lb 9.1 oz (75.1 kg)] 165 lb 9.1 oz (75.1 kg) (03/05 0424)  CBC:  Recent Labs  Lab 10/27/17 1038 10/27/17 1041  WBC 7.5  --   NEUTROABS 3.8  --   HGB 15.7 16.0  HCT 45.5 47.0  MCV 99.6  --   PLT 204  --     Basic Metabolic Panel:  Recent Labs  Lab 10/27/17 1038 10/27/17 1041 10/28/17 0204 10/29/17 1647 10/30/17 1024  NA 137 139 136  --    --   K 4.3 4.2 4.0  --   --   CL 100* 101 101  --   --   CO2 24  --  23  --   --   GLUCOSE 133* 129* 125*  --   --   BUN 16 19 16   --   --   CREATININE 1.31* 1.30* 1.17  --   --   CALCIUM 9.8  --  9.5  --   --   MG  --   --   --   --  2.1  PHOS  --   --   --  2.0* 2.7    Lipid Panel:     Component Value Date/Time   CHOL 154 10/27/2017 1240   TRIG 105 10/27/2017 1240   HDL 42 10/27/2017 1240   CHOLHDL 3.7 10/27/2017 1240   VLDL 21 10/27/2017 1240   LDLCALC 91 10/27/2017 1240   HgbA1c:  Lab Results  Component Value Date   HGBA1C 5.8 (H) 10/27/2017   Urine Drug Screen: No results found for: LABOPIA, COCAINSCRNUR, LABBENZ, AMPHETMU, THCU, LABBARB  Alcohol Level No results found for: ETH  IMAGING   Ct Head Wo Contrast 10/27/2017  IMPRESSION:  1. Acute hemorrhage in the left pons is stable since 1045 hours today, blood volume estimated at 2 mL.  2. No new intracranial abnormality.      Ct Head Code Stroke Wo Contrast 10/27/2017 IMPRESSION:  1. Acute hemorrhage within the left pons, relatively large at up to 20 mm diameter (estimated blood volume 2 mL). No extra-axial or intraventricular extension at this time. No significant intracranial mass effect.  No ventriculomegaly.  2. ASPECTS is not applicable, acute hemorrhage.    Swallowing Study and Radiology - 10/28/16 failed per speech therapy    PHYSICAL EXAM Vitals:   10/30/17 0217 10/30/17 0424 10/30/17 0849 10/30/17 1031  BP:  108/86 (!) 149/82 129/80  Pulse: (!) 131  (!) 122 (!) 118  Resp:   19   Temp:  98.8 F (37.1 C)    TempSrc:  Axillary    SpO2:   95%   Weight:  165 lb 9.1 oz (75.1 kg)    Height:       Pleasant frail  elderly Caucasian male currently not in distress. . Afebrile. Head is nontraumatic. Neck is supple without bruit.    Cardiac exam no murmur or gallop. Lungs are clear to auscultation. Distal pulses are well felt.  Neurological Exam :  Awake alert oriented x3.  Moderate dysarthria but can  be understood with some difficulty.  Left gaze preference and able to look to the right only till midline.  Blinks to threat on the left but not so on the right.  Follows commands well.  Right lower facial weakness.  Tongue midline.  Motor system exam shows dense right hemiplegia with only trace withdrawal to painful stimuli in the right upper and lower extremity.  Purposeful antigravity strength on the left without weakness.  Diminished sensation on the right hemibody compared to the left.  Tone is diminished on the right compared to the left.  Right plantar upgoing left downgoing.  Gait not tested.   HOME MEDICATIONS:  Medications Prior to Admission  Medication Sig Dispense Refill  . atorvastatin (LIPITOR) 20 MG tablet Take 1 tablet (20 mg total) by mouth daily. 90 tablet 3  . Calcium Carbonate-Vitamin D (CALCIUM 600+D3 PO) Take by mouth. TAKE ONE TABLET DAILY    . CARTIA XT 120 MG 24 hr capsule Take 120 capsules by mouth daily.    . cholecalciferol (VITAMIN D) 1000 UNITS tablet Take 1,000 Units by mouth daily.    . metoprolol succinate (TOPROL-XL) 100 MG 24 hr tablet TAKE ONE TABLET BY MOUTH ONCE DAILY WITH OR IMMEDIATELY FOLLOWING A MEAL 90 tablet 3  . Multiple Vitamins-Minerals (MULTIVITAMIN WITH MINERALS) tablet Take 1 tablet by mouth daily.    . Omega-3 Fatty Acids (FISH OIL) 1000 MG CAPS Take by mouth. Take one daily    . warfarin (COUMADIN) 5 MG tablet TAKE BY MOUTH AS DIRECTED BY COUMADIN CLINIC 90 tablet 1  . diltiazem (CARDIZEM CD) 120 MG 24 hr capsule Take 1 capsule (120 mg total) by mouth daily. 90 capsule 3  . losartan-hydrochlorothiazide (HYZAAR) 100-12.5 MG tablet Take 1 tablet by mouth daily. (Patient not taking: Reported on 10/27/2017) 90 tablet 2      HOSPITAL MEDICATIONS:  . feeding supplement (PRO-STAT SUGAR FREE 64)  30 mL Per Tube Daily  . metoprolol tartrate  12.5 mg Oral BID  . pantoprazole (PROTONIX) IV  40 mg Intravenous QHS  . senna-docusate  1 tablet Oral BID     ALLERGIES No Known Allergies  PAST MEDICAL HISTORY Past Medical History:  Diagnosis Date  . Atrial fibrillation (HCC)   . Atrial flutter (HCC)   .  Atrial flutter (HCC)   . Edema 03/17/2009   Qualifier: Diagnosis of  By: Flonnie Overman    . GERD (gastroesophageal reflux disease)   . HTN (hypertension)   . Hypercholesteremia   . Hyperlipidemia      SOCIAL HISTORY  reports that  has never smoked. he has never used smokeless tobacco. He reports that he does not drink alcohol or use drugs.  ASSESSMENT/PLAN [name] is a 85 y.o. male with history of atrial fibrillation on Coumadin, hypertension and hyperlipidemia, presenting with acute onset right-sided weakness, facial droop, dysarthria, and left gaze preference. 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 Etiology indeterminate underlying cavernoma versus hypertensive plus warfarin related coagulopathy  Resultant left gaze preference dysarthria right facial droop and right hemiplegia  CT head - acute hemorrhage within the left pons  MRI head - not performed  MRA head - not performed  Carotid Doppler - not indicated  2D Echo - not performed  LDL - 91  HgbA1c - 5.8  VTE prophylaxis - SCDs Diet NPO time specified Fall precautions  warfarin daily prior to admission, now on No antithrombotic  Ongoing aggressive stroke risk factor management  Therapy recommendations:  SNF Disposition:  SNF Hypertension  Blood pressure tends to run low  Permissive hypertension (OK if < 220/120) but gradually normalize in 5-7 days  Long-term BP goal normotensive  Hyperlipidemia  Home meds: Lipitor 20 mg daily prior to admission not resumed in hospital  LDL 91, goal < 70  Resume statin at discharge   Other Stroke Risk Factors  Advanced age  Atrial fibrillation   Other Active Problems     Plan / Recommendations   Started using tube feeds.-  mobilize  Systolic blood pressure goal less than   180. And heart rate less than 120. Continue home medications and use metoprolol for rate control  Speech therapy to continue to follow dysphagia  Likely need skilled nursing home been able to swallow or has a PEG  Long discussion with the son and wife at the bedside and they agreed to DO NOT RESUSCITATE and they would like to follow his progression over the next few days and make a decision about PEG tube later this week   Hospital day # 3    I have personally examined this patient, reviewed notes, independently viewed imaging studies, participated in medical decision making and plan of care.ROS completed by me personally and pertinent positives fully documented  I have made any additions or clarifications directly to the above note.  Greater than 50% time during this 25 minute visit was spent on counseling and coordination of care about his dysphagia and pontine hemorrhage and answering questions.  Delia Heady, MD Medical Director [facility] Pager: [phone] 10/30/2017 2:14 PM   To contact Stroke Continuity provider, please refer to [email or web address]. After hours, contact General Neurology

## 2017-10-30 NOTE — Progress Notes (Signed)
Physical Therapy Treatment Patient Details Name: Alexander PatchCarlton R Roman MRN: [id number] DOB: July 18, 1930 Today's Date: 10/30/2017    History of Present Illness Pt is an 84 y/o male with a PMH significant for HTN, a-flutter, a-fib. As he was about to climb a ladder he suddenly slumped to one side and EMS was called. CT revealed large ICH L pons.     PT Comments    Pt with increased alertness and ability to participate in PT today. Pt's HR did fluctuate between 110-130s, RN made aware. Pt remains to have severe R sided hemiplegia but did demo trace R quad set. Pt requiring maxA for all transfers and OOB mobility. Acute PT to con't to follow.  Follow Up Recommendations  SNF;Supervision/Assistance - 24 hour     Equipment Recommendations       Recommendations for Other Services       Precautions / Restrictions Precautions Precautions: Fall Precaution Comments: R sided weakness Restrictions Weight Bearing Restrictions: No    Mobility  Bed Mobility Overal bed mobility: Needs Assistance Bed Mobility: Supine to Sit     Supine to sit: Max assist;+2 for physical assistance;HOB elevated     General bed mobility comments: pt initiated L LE and UE movement but ultimately required maxAx1 with both LE management off bed and trunk elevation  Transfers Overall transfer level: Needs assistance Equipment used: (2 person lift with gait belt) Transfers: Stand Pivot Transfers   Stand pivot transfers: Max assist;+2 physical assistance       General transfer comment: pt with L LE weakness, PT to block L knee to prevent buckling. Unable to achieve full upright standing  Ambulation/Gait             General Gait Details: unable   Stairs            Wheelchair Mobility    Modified Rankin (Stroke Patients Only) Modified Rankin (Stroke Patients Only) Pre-Morbid Rankin Score: No symptoms Modified Rankin: Severe disability     Balance Overall balance assessment: Needs  assistance Sitting-balance support: No upper extremity supported;Feet supported Sitting balance-Leahy Scale: Zero Sitting balance - Comments: R lateral lean Postural control: Posterior lean;Right lateral lean                                  Cognition Arousal/Alertness: Awake/alert Behavior During Therapy: Flat affect Overall Cognitive Status: Impaired/Different from baseline Area of Impairment: Awareness;Problem solving                   Current Attention Level: Sustained   Following Commands: Follows one step commands with increased time Safety/Judgement: Decreased awareness of deficits(R sided neglect) Awareness: Intellectual Problem Solving: Slow processing;Decreased initiation;Difficulty sequencing;Requires verbal cues;Requires tactile cues General Comments: pt able to state name, place and month. Pt with improved ability to follow commands but con't to have noted R sided weakness      Exercises      General Comments        Pertinent Vitals/Pain Pain Assessment: Faces Faces Pain Scale: Hurts little more Pain Location: general moaning with movement Pain Descriptors / Indicators: Grimacing Pain Intervention(s): Monitored during session    Home Living                      Prior Function            PT Goals (current goals can now be found in the care plan section)  Progress towards PT goals: Progressing toward goals    Frequency    Min 3X/week      PT Plan Current plan remains appropriate    Co-evaluation              AM-PAC PT "6 Clicks" Daily Activity  Outcome Measure  Difficulty turning over in bed (including adjusting bedclothes, sheets and blankets)?: Unable Difficulty moving from lying on back to sitting on the side of the bed? : Unable Difficulty sitting down on and standing up from a chair with arms (e.g., wheelchair, bedside commode, etc,.)?: Unable Help needed moving to and from a bed to chair (including a  wheelchair)?: Total Help needed walking in hospital room?: Total Help needed climbing 3-5 steps with a railing? : Total 6 Click Score: 6    End of Session Equipment Utilized During Treatment: Gait belt Activity Tolerance: Patient tolerated treatment well Patient left: in chair;with call bell/phone within reach;with chair alarm set;with family/visitor present Nurse Communication: Mobility status PT Visit Diagnosis: Hemiplegia and hemiparesis Hemiplegia - Right/Left: Right Hemiplegia - dominant/non-dominant: Dominant Hemiplegia - caused by: Nontraumatic intracerebral hemorrhage     Time: 0901-0920 PT Time Calculation (min) (ACUTE ONLY): 19 min  Charges:  $Therapeutic Activity: 8-22 mins                    G Codes:       [name], PT, DPT Pager #: [phone] Office #: [phone]    [name] 10/30/2017, 10:45 AM

## 2017-10-30 NOTE — Progress Notes (Signed)
Rhonchi and gurgling breath sounds bilaterally. Rapid at bedside with CXR order. RT oral suctioned pt.  TF put on hold. Oxygen Sats 98%.  HR 115-130 after prn lopressor. Dr. Wilford CornerArora notified with new order of lopressor one time dose.

## 2017-10-30 NOTE — Significant Event (Signed)
Rapid Response Event Note  Called for follow up at 0300, per RN HR has improved some with an additional dose of Lopressor IV, MD did review chest xray, no evidence of aspiration, TF were restarted, and other VSS.
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
## 2017-10-30 NOTE — Clinical Social Work Note (Signed)
Clinical Social Work Assessment  Patient Details  Name: [name] MRN: [id number] Date of Birth: 04-15-1930  Date of referral:  10/29/17               Reason for consult:  Facility Placement                Permission sought to share information with:  Facility Sport and exercise psychologist, Family Supports Permission granted to share information::  Yes, Verbal Permission Granted  Name::     [name]  Agency::  SNF  Relationship::  Son, wife, daughter  Sport and exercise psychologist Information:     Housing/Transportation Living arrangements for the past 2 months:  Single Family Home Source of Information:  Spouse, Adult Children Patient Interpreter Needed:  None Criminal Activity/Legal Involvement Pertinent to Current Situation/Hospitalization:  No - Comment as needed Significant Relationships:  Spouse, Adult Children Lives with:  Self, Spouse Do you feel safe going back to the place where you live?  Yes Need for family participation in patient care:  Yes (Comment)(patient not oriented)  Care giving concerns:  Patient lives at home with spouse, but will be unsafe to return home at discharge. Patient is being recommended for SNF at this time.   Social Worker assessment / plan:  CSW met with patient's wife and adult children at bedside to discuss potential for SNF placement at discharge. CSW discussed facilities that contract with patient's insurance and provided that information to patient's family. CSW informed patient's family that we would continue to keep them updated on plan.  Employment status:  Retired Nurse, adult PT Recommendations:  [place] / Referral to community resources:  [place]  Patient/Family's Response to care:  Patient's family is agreeable to SNF placement if the patient is able to bounce back; patient's family is not sure that the patient will recover from this.   Patient/Family's Understanding of  and Emotional Response to Diagnosis, Current Treatment, and Prognosis:  Patient's family was appreciative of information received about SNF placement and are agreeable to looking into it, if the patient improves. Patient's daughter said that she doesn't believe he's going to make it out of the hospital with the way he's looking right now.  Emotional Assessment Appearance:  Appears stated age Attitude/Demeanor/Rapport:  Unable to Assess Affect (typically observed):  Unable to Assess Orientation:    Alcohol / Substance use:  Not Applicable Psych involvement (Current and /or in the community):  No (Comment)  Discharge Needs  Concerns to be addressed:  Care Coordination Readmission within the last 30 days:  No Current discharge risk:  Dependent with Mobility Barriers to Discharge:  Continued Medical Work up, Ship broker, Other   [name], [place] 10/30/2017, 12:23 PM

## 2017-10-31 ENCOUNTER — Other Ambulatory Visit: Payer: Self-pay

## 2017-10-31 LAB — GLUCOSE, CAPILLARY
GLUCOSE-CAPILLARY: 146 mg/dL — AB (ref 65–99)
GLUCOSE-CAPILLARY: 135 mg/dL — AB (ref 65–99)
GLUCOSE-CAPILLARY: 114 mg/dL — AB (ref 65–99)
Glucose-Capillary: 125 mg/dL — ABNORMAL HIGH (ref 65–99)
Glucose-Capillary: 132 mg/dL — ABNORMAL HIGH (ref 65–99)
Glucose-Capillary: 139 mg/dL — ABNORMAL HIGH (ref 65–99)

## 2017-10-31 LAB — MAGNESIUM: Magnesium: 2.1 mg/dL (ref 1.7–2.4)

## 2017-10-31 LAB — PHOSPHORUS: Phosphorus: 2.6 mg/dL (ref 2.5–4.6)

## 2017-10-31 MED ORDER — DILTIAZEM 12 MG/ML ORAL SUSPENSION
30.0000 mg | Freq: Four times a day (QID) | ORAL | Status: DC
  Administered 2017-10-31 – 2017-11-02 (×8): 30 mg via ORAL
  Filled 2017-10-31 (×9): qty 3

## 2017-10-31 MED ORDER — METOPROLOL TARTRATE 25 MG/10 ML ORAL SUSPENSION
25.0000 mg | Freq: Two times a day (BID) | ORAL | Status: DC
  Administered 2017-10-31 – 2017-11-01 (×3): 25 mg
  Filled 2017-10-31 (×3): qty 10

## 2017-10-31 NOTE — Progress Notes (Signed)
Occupational Therapy Treatment Patient Details Name: [name] R Condrey MRN: [id number] DOB: 29-Jan-1930 Today's Date: 10/31/2017    History of present illness Pt is an 84 y/o male with a PMH significant for HTN, a-flutter, a-fib. As he was about to climb a ladder he suddenly slumped to one side and EMS was called. CT revealed large ICH L pons.    OT comments  Pt moved to EOB today with max A.  He was noted to spontaneously lift Rt LE partially onto bed when returning to supine. He was able to assist with ROM Rt UE, but fell asleep mid exercise.   He continues to require max A - total a for ADLs.    Follow Up Recommendations  SNF;Supervision/Assistance - 24 hour    Equipment Recommendations  None recommended by OT    Recommendations for Other Services      Precautions / Restrictions Precautions Precautions: Fall Precaution Comments: R sided weakness       Mobility Bed Mobility Overal bed mobility: Needs Assistance Bed Mobility: Supine to Sit;Sit to Supine     Supine to sit: Max assist Sit to supine: Max assist   General bed mobility comments: Pt able to perform half bridging to scoot to EOB.  Max A to move Rt LE off bed and to lift trunk.  He was noted to spontaneously lift Rt LE back onto bed when returning to supine   Transfers                      Balance Overall balance assessment: Needs assistance Sitting-balance support: No upper extremity supported;Feet supported Sitting balance-Leahy Scale: Poor Sitting balance - Comments: max A for sitting balance Postural control: Posterior lean;Right lateral lean                                 ADL either performed or assessed with clinical judgement   ADL Overall ADL's : Needs assistance/impaired                             Toileting- Clothing Manipulation and Hygiene: Total assistance;Bed level Toileting - Clothing Manipulation Details (indicate cue type and reason): attempted to move  EOB, but pt needing to have BM.   Required total A for pericare after use of bedpan      Functional mobility during ADLs: Maximal assistance;+2 for physical assistance       Vision       Perception     Praxis      Cognition Arousal/Alertness: Awake/alert Behavior During Therapy: Flat affect Overall Cognitive Status: Difficult to assess Area of Impairment: Attention;Following commands;Problem solving                   Current Attention Level: Sustained   Following Commands: Follows one step commands with increased time     Problem Solving: Requires verbal cues          Exercises Other Exercises Other Exercises: AAROM performed Rt UE shoulder flexion x 5 reps with pt initiating small excursions of mvt.  Pt noted to fall asleep mid exercise    Shoulder Instructions       General Comments      Pertinent Vitals/ Pain       Pain Assessment: Faces Faces Pain Scale: Hurts little more Pain Location: generalized  Pain Descriptors / Indicators: Grimacing Pain Intervention(s): Repositioned  Home Living                                          Prior Functioning/Environment              Frequency  Min 2X/week        Progress Toward Goals  OT Goals(current goals can now be found in the care plan section)  Progress towards OT goals: Progressing toward goals     Plan Discharge plan remains appropriate    Co-evaluation                 AM-PAC PT "6 Clicks" Daily Activity     Outcome Measure   Help from another person eating meals?: Total Help from another person taking care of personal grooming?: A Lot Help from another person toileting, which includes using toliet, bedpan, or urinal?: A Lot Help from another person bathing (including washing, rinsing, drying)?: A Lot Help from another person to put on and taking off regular upper body clothing?: A Lot Help from another person to put on and taking off regular lower body  clothing?: A Lot 6 Click Score: 11    End of Session    OT Visit Diagnosis: Unsteadiness on feet (R26.81);Other abnormalities of gait and mobility (R26.89);Muscle weakness (generalized) (M62.81);Other symptoms and signs involving cognitive function;Pain;Hemiplegia and hemiparesis Hemiplegia - Right/Left: Right Hemiplegia - dominant/non-dominant: Dominant Hemiplegia - caused by: Cerebral infarction   Activity Tolerance Patient limited by fatigue   Patient Left in bed;with call bell/phone within reach;with bed alarm set   Nurse Communication Mobility status        Time: 1610-9604 OT Time Calculation (min): 26 min  Charges: OT General Charges $OT Visit: 1 Visit OT Treatments $Self Care/Home Management : 8-22 mins $Neuromuscular Re-education: 8-22 mins  [employer], OTR/L [phone]    Jeani Hawking M 10/31/2017, 12:56 PM

## 2017-10-31 NOTE — Progress Notes (Addendum)
STROKE TEAM PROGRESS NOTE   SUBJECTIVE (INTERVAL HISTORY) Dr. Leonie Man met with family and made pt DNR yesterday. They are considering palliative care, will see how he does. Not interested in palliative consult at this time. HR remains up and BP up as well. Received labetalol during the night.  OBJECTIVE Vitals:   10/31/17 0305 10/31/17 0841 10/31/17 0946 10/31/17 1239  BP: (!) 143/97 (!) 159/79    Pulse: (!) 107 (!) 51 (!) 127 62  Resp: 20 20  (!) 22  Temp:  98.6 F (37 C)  98.5 F (36.9 C)  TempSrc:  Axillary  Axillary  SpO2: 97% 94%  96%  Weight:  77.7 kg (171 lb 4.8 oz)    Height:        CBC:  Recent Labs  Lab 10/27/17 1038 10/27/17 1041  WBC 7.5  --   NEUTROABS 3.8  --   HGB 15.7 16.0  HCT 45.5 47.0  MCV 99.6  --   PLT 204  --     Basic Metabolic Panel:  Recent Labs  Lab 10/27/17 1038 10/27/17 1041 10/28/17 0204  10/30/17 1024 10/31/17 0632  NA 137 139 136  --   --   --   K 4.3 4.2 4.0  --   --   --   CL 100* 101 101  --   --   --   CO2 24  --  23  --   --   --   GLUCOSE 133* 129* 125*  --   --   --   BUN 16 19 16   --   --   --   CREATININE 1.31* 1.30* 1.17  --   --   --   CALCIUM 9.8  --  9.5  --   --   --   MG  --   --   --   --  2.1 2.1  PHOS  --   --   --    < > 2.7 2.6   < > = values in this interval not displayed.       PHYSICAL EXAM Pleasant frail  elderly Caucasian male currently not in distress. Afebrile. Head is nontraumatic. Neck is supple without bruit. Cardiac exam no murmur or gallop. Lungs are clear to auscultation. Distal pulses are well felt. Neurological Exam :  Awake alert oriented x3.  Up in chair, looking better today. Moderate dysarthria .  Left gaze improved. Blinks to threat on the left but not so on the right.  Follows commands well.  Right lower facial weakness.  Tongue midline.  Motor system exam shows dense right hemiplegia with only trace withdrawal to painful stimuli in the right upper and lower extremity.  Purposeful  antigravity strength on the left without weakness.  Diminished sensation on the right hemibody compared to the left.  Tone is diminished on the right compared to the left.  Right plantar upgoing left downgoing.  Gait not tested.   HOME MEDICATIONS:  Medications Prior to Admission  Medication Sig Dispense Refill  . atorvastatin (LIPITOR) 20 MG tablet Take 1 tablet (20 mg total) by mouth daily. 90 tablet 3  . Calcium Carbonate-Vitamin D (CALCIUM 600+D3 PO) Take by mouth. TAKE ONE TABLET DAILY    . CARTIA XT 120 MG 24 hr capsule Take 120 capsules by mouth daily.    . cholecalciferol (VITAMIN D) 1000 UNITS tablet Take 1,000 Units by mouth daily.    . metoprolol succinate (TOPROL-XL) 100 MG 24 hr  tablet TAKE ONE TABLET BY MOUTH ONCE DAILY WITH OR IMMEDIATELY FOLLOWING A MEAL 90 tablet 3  . Multiple Vitamins-Minerals (MULTIVITAMIN WITH MINERALS) tablet Take 1 tablet by mouth daily.    . Omega-3 Fatty Acids (FISH OIL) 1000 MG CAPS Take by mouth. Take one daily    . warfarin (COUMADIN) 5 MG tablet TAKE BY MOUTH AS DIRECTED BY COUMADIN CLINIC 90 tablet 1  . diltiazem (CARDIZEM CD) 120 MG 24 hr capsule Take 1 capsule (120 mg total) by mouth daily. 90 capsule 3  . losartan-hydrochlorothiazide (HYZAAR) 100-12.5 MG tablet Take 1 tablet by mouth daily. (Patient not taking: Reported on 10/27/2017) 90 tablet 2      HOSPITAL MEDICATIONS:  . diltiazem  30 mg Oral Q6H  . sennosides  5 mL Per Tube BID   And  . docusate  50 mg Per Tube BID  . feeding supplement (PRO-STAT SUGAR FREE 64)  30 mL Per Tube Daily  . metoprolol tartrate  25 mg Per Tube BID  . pantoprazole (PROTONIX) IV  40 mg Intravenous QHS    ALLERGIES No Known Allergies  PAST MEDICAL HISTORY Past Medical History:  Diagnosis Date  . Atrial fibrillation ([place])   . Atrial flutter ([place])   . Atrial flutter ([place])   . Edema 03/17/2009   Qualifier: Diagnosis of  By: Darrick Meigs    . GERD (gastroesophageal reflux disease)   . HTN  (hypertension)   . Hypercholesteremia   . Hyperlipidemia      SOCIAL HISTORY  reports that  has never smoked. he has never used smokeless tobacco. He reports that he does not drink alcohol or use drugs.  ASSESSMENT/PLAN [name] is a 84 y.o. male with history of atrial fibrillation on Coumadin, hypertension and hyperlipidemia, presenting with acute onset right-sided weakness, facial droop, dysarthria, and left gaze preference. He did not receive IV t-PA due to [place]  Left pontine ICH - on Coumadin with INR 2.33 on admission. Etiology indeterminate underlying cavernoma versus hypertensive plus warfarin related coagulopathy  Resultant left gaze preference dysarthria right facial droop and right hemiplegia  CT head - acute hemorrhage within the left pons  MRI head - not performed  MRA head - not performed  Carotid Doppler - not indicated  2D Echo - not performed  LDL - 91  HgbA1c - 5.8  VTE prophylaxis - SCDs Diet NPO time specified Fall precautions. Has tube feedings  warfarin daily prior to admission, now on No antithrombotic  Ongoing aggressive stroke risk factor management  Therapy recommendations:  SNF  Disposition:  SNF  Atrial Fib w/ RVR  HR 100-140s with prn labetalol  Increased metoprolol to 25 bid  Continue to monitor and prn labetalol as needed  Hypertension Elevated. Home meds not yet resumed Added cardizem 30 qid Monitor BP Long-term BP goal normotensive  Hyperlipidemia  Home meds: Lipitor 20 mg daily prior to admission not resumed in hospital  LDL 91, goal < 70  Resume statin at discharge  Other Stroke Risk Factors  Advanced age  Atrial fibrillation  Hospital day # 4  Follow pt next few days for improvement. Family to decide about PEG. Plan SNF if PEG placed.  [place] for Pager information 10/31/2017 3:36 PM   I have personally examined this patient, reviewed notes, independently  viewed imaging studies, participated in medical decision making and plan of care.ROS completed by me personally and pertinent positives fully documented  I have  made any additions or clarifications directly to the above note. Agree with note above. . Increase dose of metoprolol and add Cardizem for better rate control. I had a long discussion about his condition with family at the bedside yesterday and they agreed to DO NOT RESUSCITATE and would like to follow his course over the next few days and decide about PEG tube. Greater than 50% time during this 25 minute visit was spent on counseling and coordination of care about his hemorrhage, dysphagia and answering questions  Antony Contras, [place] Pager: [phone] 10/31/2017 3:59 PM  To contact Stroke Continuity provider, please refer to [email or web address] After hours, contact General Neurology

## 2017-10-31 NOTE — Care Management Important Message (Signed)
Important Message  Patient Details  Name: Marthe PatchCarlton R Cornacchia MRN: [id number] Date of Birth: 06-28-1930   Medicare Important Message Given:  Yes    Jelitza Manninen 10/31/2017, 2:06 PM
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
## 2017-10-31 NOTE — Progress Notes (Addendum)
Received call from CCMD, patient had an 8 beat run of SVT  Patient current HR continues to be greater than 110  Range from 110 - 170 bpm  RN paged provider on call -Received verbal order to give another PRN dose of labetalol & contact provider if HR still high in an hour  0500 Update - updated provider on call with current BP of 108/65 and HR staying blow 110  RN will continue to monitor patient

## 2017-11-01 ENCOUNTER — Inpatient Hospital Stay (HOSPITAL_COMMUNITY): Payer: Medicare HMO

## 2017-11-01 ENCOUNTER — Other Ambulatory Visit: Payer: Self-pay

## 2017-11-01 LAB — GLUCOSE, CAPILLARY
GLUCOSE-CAPILLARY: 123 mg/dL — AB (ref 65–99)
GLUCOSE-CAPILLARY: 122 mg/dL — AB (ref 65–99)
Glucose-Capillary: 112 mg/dL — ABNORMAL HIGH (ref 65–99)
Glucose-Capillary: 103 mg/dL — ABNORMAL HIGH (ref 65–99)
Glucose-Capillary: 99 mg/dL (ref 65–99)

## 2017-11-01 MED ORDER — STARCH (THICKENING) PO POWD
Freq: Three times a day (TID) | ORAL | Status: DC
  Filled 2017-11-01: qty 227

## 2017-11-01 MED ORDER — RESOURCE THICKENUP CLEAR PO POWD
ORAL | Status: DC | PRN
  Filled 2017-11-01: qty 125

## 2017-11-01 MED ORDER — RESOURCE THICKENUP CLEAR PO POWD
Freq: Three times a day (TID) | ORAL | Status: DC
  Administered 2017-11-01 – 2017-11-07 (×9): via ORAL
  Filled 2017-11-01: qty 125

## 2017-11-01 MED ORDER — METOPROLOL TARTRATE 5 MG/5ML IV SOLN
2.5000 mg | Freq: Four times a day (QID) | INTRAVENOUS | Status: DC | PRN
Start: 2017-11-01 — End: 2017-11-05
  Administered 2017-11-02 – 2017-11-05 (×3): 2.5 mg via INTRAVENOUS
  Filled 2017-11-01 (×3): qty 5

## 2017-11-01 MED ORDER — LABETALOL HCL 5 MG/ML IV SOLN
10.0000 mg | INTRAVENOUS | Status: DC | PRN
Start: 2017-11-01 — End: 2017-11-01

## 2017-11-01 MED ORDER — METOPROLOL TARTRATE 5 MG/5ML IV SOLN: 2.5000 mg | Freq: Four times a day (QID) | INTRAVENOUS | Status: DC

## 2017-11-01 MED ORDER — METOPROLOL TARTRATE 25 MG/10 ML ORAL SUSPENSION
37.5000 mg | Freq: Two times a day (BID) | ORAL | Status: DC
  Administered 2017-11-01 – 2017-11-02 (×2): 37.5 mg
  Filled 2017-11-01 (×2): qty 15
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
## 2017-11-01 NOTE — Progress Notes (Addendum)
STROKE TEAM PROGRESS NOTE   SUBJECTIVE (INTERVAL HISTORY) Daughter at bedside. Recently returned from [facility]. Passed with Dysphagia 1 diet. Will maintain Cortrek/TF's for today until patient can tolerate feedings. MSW to assist family with SNF placement. CIR may re-evaluate. Patient continues to have some elevated pulse and B/P overnight. Medication dosage changes made. Will repeat labs in AM.  Recent Labs  Lab 10/27/17 1038 10/27/17 1041  WBC 7.5  --   HGB 15.7 16.0  HCT 45.5 47.0  MCV 99.6  --   PLT 204  --    Recent Labs  Lab 10/27/17 1038 10/27/17 1041 10/28/17 0204 10/29/17 1647 10/30/17 1024 10/31/17 0632  NA 137 139 136  --   --   --   K 4.3 4.2 4.0  --   --   --   CL 100* 101 101  --   --   --   CO2 24  --  23  --   --   --   GLUCOSE 133* 129* 125*  --   --   --   BUN 16 19 16   --   --   --   CREATININE 1.31* 1.30* 1.17  --   --   --   CALCIUM 9.8  --  9.5  --   --   --   MG  --   --   --   --  2.1 2.1  PHOS  --   --   --  2.0* 2.7 2.6   Recent Labs  Lab 10/27/17 1038  AST 44*  ALT 34  ALKPHOS 86  BILITOT 1.0  PROT 7.1  ALBUMIN 3.9   PHYSICAL EXAM Temp:  [98.1 F (36.7 C)-98.8 F (37.1 C)] 98.2 F (36.8 C) (03/07 1157) Pulse Rate:  [65-170] 89 (03/07 1157) Resp:  [15-24] 16 (03/07 1157) BP: (133-185)/(77-99) 140/81 (03/07 1157) SpO2:  [95 %-96 %] 96 % (03/07 1157)  Pleasant frail  elderly Caucasian male currently not in distress. Afebrile. Head is nontraumatic. Neck is supple without bruit. Cardiac exam no murmur or gallop. Lungs are clear to auscultation. Distal pulses are well felt. Neurological Exam :  Awake alert oriented x3.  Up in chair in NAD. Moderate dysarthria .  Left gaze improved. Blinks to threat on the left but not so on the right.  Follows commands well.  Right lower facial weakness.  Tongue midline.  Motor system exam shows dense right hemiplegia with only trace withdrawal to painful stimuli in the right upper and lower extremity.   Purposeful antigravity strength on the left without weakness.  Diminished sensation on the right hemibody compared to the left.  Tone is diminished on the right compared to the left.  Right plantar upgoing left downgoing.  Gait not tested.  IMAGING: I have personally reviewed the radiological images below and agree with the radiology interpretations.  CT HEAD WITHOUT CONTRAST 10/27/2017 10:53 IMPRESSION: 1. Acute hemorrhage within the left pons, relatively large at up to 20 mm diameter (estimated blood volume 2 mL). No extra-axial or intraventricular extension at this time. No significant intracranial mass effect.  No ventriculomegaly. 2. ASPECTS is not applicable, acute hemorrhage.  CT HEAD WITHOUT CONTRAST 10/27/2017 15:53 IMPRESSION: 1. Acute hemorrhage in the left pons is stable since 1045 hours today, blood volume estimated at 2 mL. 2. No new intracranial abnormality.  PORTABLE CHEST 1 VIEW 10/30/2017 00:17 IMPRESSION: Hypoventilatory chest with mild bibasilar atelectasis, which may be secondary to low lung volumes or seen with  aspiration. No confluent airspace disease. Unchanged cardiomegaly  ASSESSMENT/PLAN Mr. Marthe PatchCarlton R Vanwieren is a 84 y.o. male with history of atrial fibrillation on Coumadin, hypertension and hyperlipidemia, presenting with acute onset right-sided weakness, facial droop, dysarthria, and left gaze preference. 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 Etiology indeterminate underlying cavernoma versus hypertensive plus warfarin related coagulopathy  Resultant left gaze preference dysarthria right facial droop and right hemiplegia  CT head - acute hemorrhage within the left pons  LDL - 91  HgbA1c - 5.8  VTE prophylaxis - SCDs Fall precautions DIET - DYS 1 Room service appropriate? Yes; Fluid consistency: Nectar Thick. Has tube feedings  warfarin daily prior to admission, now on No  antithrombotic  Ongoing aggressive stroke risk factor management  Therapy recommendations:  SNF  Disposition:  SNF  Atrial Fib w/ RVR  HR 100-140s with prn labetalol  Increased Metoprolol to 37.5mg  BID, 11/01/2017  Continue to monitor closely, PRN IV Metoprolol as needed  Hypertension Added cardizem 30 QID 10/31/2017 Long-term BP goal normotensive  Continue to monitor closely, PRN IV Metoprolol as needed  Hyperlipidemia  Home meds: Lipitor 20 mg daily prior to admission not resumed in hospital  LDL 91, goal < 70  Resume statin at discharge  Other Stroke Risk Factors  Advanced age  Atrial fibrillation  Hospital day # 5  VTE prophylaxis: SCD's  Diet : Fall precautions DIET - DYS 1 Room service appropriate? Yes; Fluid consistency: Nectar Thick   FAMILY UPDATES: family at bedside  TEAM UPDATES: Micki RileySethi, Mehak Roskelley S, MD STATUS:  DNR    Prior Home Stroke Medications:  warfarin daily  Discharge Stroke Meds:  Please discharge patient on No antithrombotic due to ICH   Disposition:  Therapy Recs:               SNF vs CIR Home Equipment:         PENDING Follow Up:  Follow-up Information    Micki RileySethi, Earlin Sweeden S, MD. Schedule an appointment as soon as possible for a visit in 6 week(s).   Specialties:  Neurology, Radiology Contact information: [address] [place] [id number] [phone]          Patient, No Pcp Per -PCP Follow up in 1-2 weeks    Case Management aware of need    Beryl MeagerMary A Costello  Neurology Stroke Team 11/01/2017 1:56 PM   I have personally examined this patient, reviewed notes, independently viewed imaging studies, participated in medical decision making and plan of care.ROS completed by me personally and pertinent positives fully documented  I have made any additions or clarifications directly to the above note. Agree with note above.   10/31/2017 ATTENDING ASSESSMENT AND PLAN Increase dose of metoprolol and add Cardizem for better rate  control. I had a long discussion about his condition with family at the bedside yesterday and they agreed to DO NOT RESUSCITATE and would like to follow his course over the next few days and decide about PEG tube.   11/01/2017 ATTENDING ASSESSMENT AND PLAN I have personally examined this patient, reviewed notes, independently viewed imaging studies, participated in medical decision making and plan of care.ROS completed by me personally and pertinent positives fully documented  I have made any additions or clarifications directly to the above note. Agree with note above. Discussed with wife and family at the bedside and answered questions. Plan to keep panda tube for at least  24 hours to make sure that the patient can eat enough. Expect transfer to rehabilitation in the next few days  Delia Heady, MD Medical Director [facility] Pager: [phone] 11/01/2017 4:10 PM     Greater than 50% time during this 25 minute visit was spent on counseling and coordination of care about his hemorrhage, dysphagia and answering questions  Delia Heady, MD Medical Director [facility] Pager: [phone] 11/01/2017 1:56 PM  To contact Stroke Continuity provider, please refer to [email or web address]. After hours, contact General Neurology

## 2017-11-01 NOTE — Progress Notes (Signed)
PT Cancellation Note  Patient Details Name: [name] MRN: [id number] DOB: [birth year]   Cancelled Treatment:    Reason Eval/Treat Not Completed: Patient at procedure or test/unavailable Will follow up as time allows.   [name] 11/01/2017, 10:29 AM [name], PT, DPT [phone]

## 2017-11-01 NOTE — Progress Notes (Signed)
Physical Therapy Treatment Patient Details Name: [name] MRN: [id number] DOB: 12-10-1929 Today's Date: 11/01/2017    History of Present Illness Pt is an 84 y/o male with a PMH significant for HTN, a-flutter, a-fib. As he was about to climb a ladder he suddenly slumped to one side and EMS was called. CT revealed large ICH L pons.     PT Comments    Patient progressing slowly towards PT goals. Better able to initiate bed mobility and transfers today but still requires Max-total A of 2. Pt with improved sitting balance today and able to activate spinal extensors but not sustain. Pt with right hemiparesis and inattention. Continues to be difficult to understand due to dysarthria. Alert and participatory. Continue to recommend SNF as pt will most likely need longer term care. Will follow.    Follow Up Recommendations  SNF;Supervision/Assistance - 24 hour     Equipment Recommendations  None recommended by PT    Recommendations for Other Services       Precautions / Restrictions Precautions Precautions: Fall Precaution Comments: R sided weakness Restrictions Weight Bearing Restrictions: No    Mobility  Bed Mobility Overal bed mobility: Independent Bed Mobility: Supine to Sit     Supine to sit: Max assist;HOB elevated;+2 for physical assistance     General bed mobility comments: Able to perform half bridging with therapist stabilizing RLE to scoot to EOB; able to initiate movement of LLE off bed, assist with trunk.  Transfers Overall transfer level: Needs assistance Equipment used: 2 person hand held assist(gait belt with pad) Transfers: Sit to/from Stand;Stand Pivot Transfers Sit to Stand: Max assist;+2 physical assistance Stand pivot transfers: Max assist;+2 physical assistance       General transfer comment: Cues for anterior weight shift and forward translation to stand from EOB x2; SPT bed to chair with cues for upright. Not able to obtain full upright  standing.  Ambulation/Gait             General Gait Details: unable   Stairs            Wheelchair Mobility    Modified Rankin (Stroke Patients Only) Modified Rankin (Stroke Patients Only) Pre-Morbid Rankin Score: No symptoms Modified Rankin: Severe disability     Balance Overall balance assessment: Needs assistance Sitting-balance support: Feet supported;Single extremity supported Sitting balance-Leahy Scale: Poor Sitting balance - Comments: Able to sit EOB for moments without external support; able to initiate and activate spinal extensors for moments but not sustain. Heavy right lean. Postural control: Posterior lean;Right lateral lean Standing balance support: During functional activity Standing balance-Leahy Scale: Zero Standing balance comment: Total A for static standing, cues for upright. not able to get fully upright today.                            Cognition Arousal/Alertness: Awake/alert Behavior During Therapy: Flat affect Overall Cognitive Status: Difficult to assess Area of Impairment: Attention;Following commands;Problem solving                   Current Attention Level: Sustained   Following Commands: Follows one step commands with increased time Safety/Judgement: Decreased awareness of deficits   Problem Solving: Requires verbal cues General Comments: Able to follow commands with increased time and cues.       Exercises      General Comments General comments (skin integrity, edema, etc.): Son and wife present during session. HR ranged from 80-130 bpm A-fib.  Pertinent Vitals/Pain Pain Assessment: Faces Faces Pain Scale: Hurts a little bit Pain Location: left hand at IV site Pain Descriptors / Indicators: Guarding Pain Intervention(s): Monitored during session;Repositioned    Home Living                      Prior Function            PT Goals (current goals can now be found in the care plan  section) Progress towards PT goals: Progressing toward goals(slowly)    Frequency    Min 3X/week      PT Plan Current plan remains appropriate    Co-evaluation PT/OT/SLP Co-Evaluation/Treatment: Yes Reason for Co-Treatment: For patient/therapist safety;To address functional/ADL transfers PT goals addressed during session: Mobility/safety with mobility;Balance        AM-PAC PT "6 Clicks" Daily Activity  Outcome Measure  Difficulty turning over in bed (including adjusting bedclothes, sheets and blankets)?: Unable Difficulty moving from lying on back to sitting on the side of the bed? : Unable Difficulty sitting down on and standing up from a chair with arms (e.g., wheelchair, bedside commode, etc,.)?: Unable Help needed moving to and from a bed to chair (including a wheelchair)?: Total Help needed walking in hospital room?: Total Help needed climbing 3-5 steps with a railing? : Total 6 Click Score: 6    End of Session Equipment Utilized During Treatment: Gait belt Activity Tolerance: Patient tolerated treatment well Patient left: in chair;with call bell/phone within reach;with chair alarm set;with family/visitor present Nurse Communication: Mobility status PT Visit Diagnosis: Hemiplegia and hemiparesis Hemiplegia - Right/Left: Right Hemiplegia - dominant/non-dominant: Dominant Hemiplegia - caused by: Nontraumatic intracerebral hemorrhage     Time: 1336-1359 PT Time Calculation (min) (ACUTE ONLY): 23 min  Charges:  $Neuromuscular Re-education: 8-22 mins                    G Codes:       [name], PT, DPT [phone]     [name] 11/01/2017, 2:06 PM

## 2017-11-01 NOTE — Progress Notes (Signed)
Occupational Therapy Treatment Patient Details Name: [name] MRN: [id number] DOB: [birth year] Today's Date: 11/01/2017    History of present illness Pt is an 84 y/o male with a PMH significant for HTN, a-flutter, a-fib. As he was about to climb a ladder he suddenly slumped to one side and EMS was called. CT revealed large ICH L pons.    OT comments  Pt progressing towards goals and is motivated to work with therapy. Pt completing bed mobility and stand pivot transfer to recliner with MaxA+2 this session. Pt requires overall mod-maxA to maintain static sitting balance EOB, with brief periods of able to maintain with minguard-minA given cues for upright posture. Pt continues to require maxA for UB/LB ADLs, minguard assist for simple grooming ADLs. Feel SNF remains appropriate recommendation at this time. Will continue to follow acutely to progress pt towards established OT goals.    Follow Up Recommendations  SNF;Supervision/Assistance - 24 hour    Equipment Recommendations  None recommended by OT          Precautions / Restrictions Precautions Precautions: Fall Precaution Comments: R sided weakness Restrictions Weight Bearing Restrictions: No       Mobility Bed Mobility Overal bed mobility: Needs Assistance Bed Mobility: Supine to Sit     Supine to sit: Max assist;HOB elevated;+2 for physical assistance     General bed mobility comments: Able to perform half bridging with therapist stabilizing RLE to scoot to EOB; able to initiate movement of LLE off bed, assist with trunk.  Transfers Overall transfer level: Needs assistance Equipment used: 2 person hand held assist(gait beld with pad ) Transfers: Sit to/from BJ'sStand;Stand Pivot Transfers Sit to Stand: Max assist;+2 physical assistance Stand pivot transfers: Max assist;+2 physical assistance       General transfer comment: Cues for anterior weight shift and forward translation to stand from EOB x2; SPT bed to chair  with cues for upright. Not able to obtain full upright standing.    Balance Overall balance assessment: Needs assistance Sitting-balance support: Feet supported;Single extremity supported Sitting balance-Leahy Scale: Poor Sitting balance - Comments: Able to sit EOB for moments without external support; able to initiate and activate spinal extensors for moments but not sustain. Heavy right lean. Postural control: Posterior lean;Right lateral lean Standing balance support: During functional activity Standing balance-Leahy Scale: Zero Standing balance comment: Total A for static standing, cues for upright. not able to get fully upright today.                           ADL either performed or assessed with clinical judgement   ADL Overall ADL's : Needs assistance/impaired     Grooming: Wash/dry face;Sitting;Set up Grooming Details (indicate cue type and reason): setup for washing face in supported sitting; pt requiring min-maxA for static balance in unsupported sitting                              Functional mobility during ADLs: +2 for physical assistance;+2 for safety/equipment;Maximal assistance General ADL Comments: pt sitting EOB and completing stand pivot to recliner; pt continues to demonstrate poor sitting balance and overall functional performance; requires frequent cues to maintain upright posture EOB, with pt able to maintain with minGuard-minA for brief periods of time     Cognition Arousal/Alertness: Awake/alert Behavior During Therapy: Flat affect Overall Cognitive Status: Difficult to assess Area of Impairment: Attention;Following commands;Problem solving  Current Attention Level: Sustained   Following Commands: Follows one step commands with increased time Safety/Judgement: Decreased awareness of deficits   Problem Solving: Requires verbal cues General Comments: Able to follow commands with increased time and cues.                      General Comments Son and wife present during session. HR ranged from 80-130 bpm A-fib.    Pertinent Vitals/ Pain       Pain Assessment: Faces Faces Pain Scale: Hurts a little bit Pain Location: left hand at IV site Pain Descriptors / Indicators: Guarding Pain Intervention(s): Monitored during session;Repositioned                                                          Frequency  Min 2X/week        Progress Toward Goals  OT Goals(current goals can now be found in the care plan section)  Progress towards OT goals: Progressing toward goals  Acute Rehab OT Goals Patient Stated Goal: Get better OT Goal Formulation: With patient Time For Goal Achievement: 11/11/17 Potential to Achieve Goals: Good  Plan Discharge plan remains appropriate    Co-evaluation    PT/OT/SLP Co-Evaluation/Treatment: Yes Reason for Co-Treatment: For patient/therapist safety;To address functional/ADL transfers PT goals addressed during session: Mobility/safety with mobility;Balance OT goals addressed during session: ADL's and self-care;Strengthening/ROM      AM-PAC PT "6 Clicks" Daily Activity     Outcome Measure   Help from another person eating meals?: A Lot Help from another person taking care of personal grooming?: A Lot Help from another person toileting, which includes using toliet, bedpan, or urinal?: A Lot Help from another person bathing (including washing, rinsing, drying)?: A Lot Help from another person to put on and taking off regular upper body clothing?: A Lot Help from another person to put on and taking off regular lower body clothing?: A Lot 6 Click Score: 12    End of Session Equipment Utilized During Treatment: Gait belt  OT Visit Diagnosis: Unsteadiness on feet (R26.81);Other abnormalities of gait and mobility (R26.89);Muscle weakness (generalized) (M62.81);Other symptoms and signs involving cognitive function;Hemiplegia  and hemiparesis Hemiplegia - Right/Left: Right Hemiplegia - dominant/non-dominant: Dominant Hemiplegia - caused by: Cerebral infarction   Activity Tolerance Patient tolerated treatment well   Patient Left in chair;with call bell/phone within reach;with chair alarm set;with family/visitor present   Nurse Communication Mobility status        Time: 1610-9604 OT Time Calculation (min): 25 min  Charges: OT General Charges $OT Visit: 1 Visit OT Treatments $Therapeutic Activity: 8-22 mins  Marcy Siren, OT Pager [phone] 11/01/2017   [name] 11/01/2017, 4:28 PM

## 2017-11-01 NOTE — Care Management Note (Signed)
Case Management Note  Patient Details  Name: [name] MRN: [id number] Date of Birth: 1929/10/24  Subjective/Objective:                    Action/Plan: Plan is for patient to d/c to SNF when medically ready. CM following.  Expected Discharge Date:                  Expected Discharge Plan:  Skilled Nursing Facility  In-House Referral:  Clinical Social Work  Discharge planning Services     Post Acute Care Choice:    Choice offered to:     DME Arranged:    DME Agency:     HH Arranged:    HH Agency:     Status of Service:  Completed, signed off  If discussed at [employer] Meetings, dates discussed:    Additional Comments:  Kermit BaloKelli F Yazmyn Valbuena, RN 11/01/2017, 4:02 PM

## 2017-11-01 NOTE — Progress Notes (Signed)
Modified Barium Swallow Progress Note  Patient Details  Name: [name] MRN: [id number] Date of Birth: [birth year]  Today's Date: 11/01/2017  Modified Barium Swallow completed.  Full report located under Chart Review in the Imaging Section.  Brief recommendations include the following:  Clinical Impression  Pt demonstrates improved pharyngeal strength since prior MBS, though severe oral weakness and impared pharyngeal sensation persist. There is severe right buccal weakness limiting labial seal and negative oral pressure with anterior and premature spill of bolus as well as right buccal residue. Hyolaryngeal elevation and airway protection are delayed with resulting penetration and aspiration events before the swallow dependent of bolus size and texture. Moderate base vallecular and suspected R>L pharyngeal residuals present with puree and nectar, though much improved with a chin tuck.  Today pt able to consume nectar thick liquids and puree with most effectively with a spoon and a chin tuck. Intermittent throat clear and second swallow also beneficial. Recommend pt initiate a modified diet of puree and nectar thick liquids with a chin tuck via spoon only.  Strategies and modifications will reduce, but not eliminate aspiration risk.  Continue use of NG tube until tolerance and adequate intake established.    Swallow Evaluation Recommendations       SLP Diet Recommendations: Dysphagia 1 (Puree) solids;Nectar thick liquid   Liquid Administration via: Spoon;No straw(no cup)   Medication Administration: Crushed with puree   Supervision: Full assist for feeding;Full supervision/cueing for compensatory strategies   Compensations: Slow rate;Small sips/bites;Monitor for anterior loss;Multiple dry swallows after each bite/sip;Clear throat intermittently;Chin tuck   Postural Changes: Remain semi-upright after after feeds/meals (Comment);Seated upright at 90 degrees   Oral Care  Recommendations: Oral care BID   Other Recommendations: Have oral suction available   Alexander DittyBonnie Persais Ethridge, MA CCC-SLP [phone]  [name], [name] Caroline 11/01/2017,12:21 PM

## 2017-11-02 LAB — GLUCOSE, CAPILLARY
GLUCOSE-CAPILLARY: 136 mg/dL — AB (ref 65–99)
GLUCOSE-CAPILLARY: 99 mg/dL (ref 65–99)
Glucose-Capillary: 101 mg/dL — ABNORMAL HIGH (ref 65–99)
Glucose-Capillary: 102 mg/dL — ABNORMAL HIGH (ref 65–99)
Glucose-Capillary: 147 mg/dL — ABNORMAL HIGH (ref 65–99)
Glucose-Capillary: 89 mg/dL (ref 65–99)

## 2017-11-02 LAB — BASIC METABOLIC PANEL
ANION GAP: 10 (ref 5–15)
BUN: 22 mg/dL — ABNORMAL HIGH (ref 6–20)
CHLORIDE: 107 mmol/L (ref 101–111)
CO2: 23 mmol/L (ref 22–32)
Calcium: 8.9 mg/dL (ref 8.9–10.3)
Creatinine, Ser: 0.96 mg/dL (ref 0.61–1.24)
GFR calc Af Amer: >60 mL/min (ref >60)
Glucose, Bld: 120 mg/dL — ABNORMAL HIGH (ref 65–99)
POTASSIUM: 3.7 mmol/L (ref 3.5–5.1)
Sodium: 140 mmol/L (ref 135–145)

## 2017-11-02 LAB — CBC
HEMATOCRIT: 44.9 % (ref 39.0–52.0)
HEMOGLOBIN: 15.4 g/dL (ref 13.0–17.0)
MCH: 34.4 pg — AB (ref 26.0–34.0)
MCHC: 34.3 g/dL (ref 30.0–36.0)
MCV: 100.2 fL — ABNORMAL HIGH (ref 78.0–100.0)
PLATELETS: 202 10*3/uL (ref 150–400)
RBC: 4.48 MIL/uL (ref 4.22–5.81)
RDW: 13.3 % (ref 11.5–15.5)
WBC: 12.5 10*3/uL — AB (ref 4.0–10.5)

## 2017-11-02 LAB — MAGNESIUM: Magnesium: 2.1 mg/dL (ref 1.7–2.4)

## 2017-11-02 MED ORDER — METOPROLOL TARTRATE 12.5 MG HALF TABLET
12.5000 mg | ORAL_TABLET | Freq: Once | ORAL | Status: AC
Start: 2017-11-02 — End: 2017-11-02
  Administered 2017-11-02: 12.5 mg via ORAL
  Filled 2017-11-02: qty 1

## 2017-11-02 MED ORDER — SODIUM CHLORIDE 0.9 % IV SOLN
INTRAVENOUS | Status: DC
  Administered 2017-11-02 (×2): via INTRAVENOUS

## 2017-11-02 MED ORDER — METOPROLOL TARTRATE 25 MG/10 ML ORAL SUSPENSION
50.0000 mg | Freq: Two times a day (BID) | ORAL | Status: DC
  Administered 2017-11-02 – 2017-11-07 (×10): 50 mg
  Filled 2017-11-02 (×12): qty 20

## 2017-11-02 MED ORDER — METOPROLOL TARTRATE 25 MG/10 ML ORAL SUSPENSION: 50.0000 mg | Freq: Two times a day (BID) | ORAL | Status: DC

## 2017-11-02 MED ORDER — LORAZEPAM 2 MG/ML IJ SOLN
1.0000 mg | Freq: Once | INTRAMUSCULAR | Status: AC
Start: 2017-11-02 — End: 2017-11-02
  Administered 2017-11-02: 1 mg via INTRAVENOUS
  Filled 2017-11-02: qty 1

## 2017-11-02 MED ORDER — DILTIAZEM HCL ER COATED BEADS 180 MG PO CP24
180.0000 mg | ORAL_CAPSULE | Freq: Every day | ORAL | Status: DC
  Administered 2017-11-03 – 2017-11-04 (×2): 180 mg via ORAL
  Filled 2017-11-02 (×2): qty 1

## 2017-11-02 MED ORDER — DILTIAZEM HCL 60 MG PO TABS
60.0000 mg | ORAL_TABLET | Freq: Once | ORAL | Status: AC
  Administered 2017-11-02: 60 mg via ORAL
  Filled 2017-11-02: qty 1

## 2017-11-02 MED ORDER — ENSURE ENLIVE PO LIQD
237.0000 mL | Freq: Three times a day (TID) | ORAL | Status: DC
  Administered 2017-11-02 – 2017-11-06 (×5): 237 mL via ORAL

## 2017-11-02 MED ORDER — QUETIAPINE FUMARATE 25 MG PO TABS
25.0000 mg | ORAL_TABLET | Freq: Two times a day (BID) | ORAL | Status: DC
  Filled 2017-11-02: qty 1

## 2017-11-02 MED ORDER — QUETIAPINE FUMARATE 25 MG PO TABS
12.5000 mg | ORAL_TABLET | Freq: Two times a day (BID) | ORAL | Status: DC
  Administered 2017-11-02 – 2017-11-07 (×10): 12.5 mg via ORAL
  Filled 2017-11-02 (×10): qty 1

## 2017-11-02 MED ORDER — DILTIAZEM HCL ER COATED BEADS 120 MG PO CP24
120.0000 mg | ORAL_CAPSULE | Freq: Every day | ORAL | Status: DC
  Administered 2017-11-02: 120 mg via ORAL
  Filled 2017-11-02: qty 1

## 2017-11-02 NOTE — Progress Notes (Signed)
[id number]- Called and notified M. Costello (NP) that patient's HR is sustaining at 130's. She said to give the scheduled dose of metoprolol.  [id number]- Called M. Costello that patient was very agitated in the bed, pulled out his condom cath. Ativan given IV per order. Held Seroquel and notified NP at 1325H that patient was snoring, BP improved but HR still no changed. She said ok to hold seroquel. New order for metoprolol.

## 2017-11-02 NOTE — Progress Notes (Addendum)
STROKE TEAM PROGRESS NOTE   SUBJECTIVE (INTERVAL HISTORY) No family at bedside today. Passed MBS with Dysphagia 1 diet. Feeding assistance with each meal and added Ensure TID. Will maintain Cortrek/TF's until patient can increase PO intake. SLP following. Family has decided on SNF- [facility]. Awaiting insurance approval. Continues to have some elevated pulse and B/P overnight. Medication dosage changes made. AM labs are wnl. Slightly elevated WBC but remains afebrile. Last CXR stable. Incentive spirometry added today. IVF remain in progress until improved PO intake.  Results for orders placed or performed during the hospital encounter of 10/27/17 (from the past 24 hour(s))  Glucose, capillary     Status: Abnormal   Collection Time: 11/01/17  4:31 PM  Result Value Ref Range   Glucose-Capillary 103 (H) 65 - 99 mg/dL   Comment 1 Notify RN    Comment 2 Document in Chart   Glucose, capillary     Status: None   Collection Time: 11/01/17  9:17 PM  Result Value Ref Range   Glucose-Capillary 99 65 - 99 mg/dL   Comment 1 Notify RN    Comment 2 Document in Chart   Glucose, capillary     Status: Abnormal   Collection Time: 11/02/17 12:12 AM  Result Value Ref Range   Glucose-Capillary 102 (H) 65 - 99 mg/dL   Comment 1 Notify RN    Comment 2 Document in Chart   CBC     Status: Abnormal   Collection Time: 11/02/17  3:38 AM  Result Value Ref Range   WBC 12.5 (H) 4.0 - 10.5 K/uL   RBC 4.48 4.22 - 5.81 MIL/uL   Hemoglobin 15.4 13.0 - 17.0 g/dL   HCT 16.1 09.6 - 04.5 %   MCV 100.2 (H) 78.0 - 100.0 fL   MCH 34.4 (H) 26.0 - 34.0 pg   MCHC 34.3 30.0 - 36.0 g/dL   RDW 40.9 81.1 - 91.4 %   Platelets 202 150 - 400 K/uL  Basic metabolic panel     Status: Abnormal   Collection Time: 11/02/17  3:38 AM  Result Value Ref Range   Sodium 140 135 - 145 mmol/L   Potassium 3.7 3.5 - 5.1 mmol/L   Chloride 107 101 - 111 mmol/L   CO2 23 22 - 32 mmol/L   Glucose, Bld 120 (H) 65 - 99 mg/dL   BUN 22 (H) 6 - 20  mg/dL   Creatinine, Ser 7.82 0.61 - 1.24 mg/dL   Calcium 8.9 8.9 - 95.6 mg/dL   GFR calc non Af Amer >60 >60 mL/min   GFR calc Af Amer >60 >60 mL/min   Anion gap 10 5 - 15  Magnesium     Status: None   Collection Time: 11/02/17  3:38 AM  Result Value Ref Range   Magnesium 2.1 1.7 - 2.4 mg/dL  Glucose, capillary     Status: Abnormal   Collection Time: 11/02/17  4:01 AM  Result Value Ref Range   Glucose-Capillary 136 (H) 65 - 99 mg/dL   Comment 1 Notify RN    Comment 2 Document in Chart   Glucose, capillary     Status: Abnormal   Collection Time: 11/02/17  7:26 AM  Result Value Ref Range   Glucose-Capillary 101 (H) 65 - 99 mg/dL  Glucose, capillary     Status: Abnormal   Collection Time: 11/02/17 11:21 AM  Result Value Ref Range   Glucose-Capillary 147 (H) 65 - 99 mg/dL   PHYSICAL EXAM Temp:  [98.1  F (36.7 C)-98.8 F (37.1 C)] 98.2 F (36.8 C) (03/07 1157) Pulse Rate:  [65-170] 89 (03/07 1157) Resp:  [15-24] 16 (03/07 1157) BP: (133-185)/(77-99) 140/81 (03/07 1157) SpO2:  [95 %-96 %] 96 % (03/07 1157)  Pleasant frail  elderly Caucasian male currently not in distress. Afebrile. Head is nontraumatic. Neck is supple without bruit. Cardiac exam no murmur or gallop. Lungs are clear to auscultation. Distal pulses are well felt. Neurological Exam :  Drowsy but can be aroused.  Up in chair in NAD. Moderate dysarthria .  Left gaze improved. Blinks to threat on the left but not so on the right.  Follows commands well.  Right lower facial weakness.  Tongue midline.  Motor system exam shows dense right hemiplegia with only trace withdrawal to painful stimuli in the right upper and lower extremity.  Purposeful antigravity strength on the left without weakness.  Diminished sensation on the right hemibody compared to the left.  Tone is diminished on the right compared to the left.  Right plantar upgoing left downgoing.  Gait not tested.  IMAGING: I have personally reviewed the radiological  images below and agree with the radiology interpretations.  CT HEAD WITHOUT CONTRAST 10/27/2017 10:53 IMPRESSION: 1. Acute hemorrhage within the left pons, relatively large at up to 20 mm diameter (estimated blood volume 2 mL). No extra-axial or intraventricular extension at this time. No significant intracranial mass effect.  No ventriculomegaly. 2. ASPECTS is not applicable, acute hemorrhage.  CT HEAD WITHOUT CONTRAST 10/27/2017 15:53 IMPRESSION: 1. Acute hemorrhage in the left pons is stable since 1045 hours today, blood volume estimated at 2 mL. 2. No new intracranial abnormality.  PORTABLE CHEST 1 VIEW 10/30/2017 00:17 IMPRESSION: Hypoventilatory chest with mild bibasilar atelectasis, which may be secondary to low lung volumes or seen with aspiration. No confluent airspace disease. Unchanged cardiomegaly  ASSESSMENT/PLAN [name] is a 84 y.o. male with history of atrial fibrillation on Coumadin, hypertension and hyperlipidemia, presenting with acute onset right-sided weakness, facial droop, dysarthria, and left gaze preference. 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 Etiology indeterminate underlying cavernoma versus hypertensive plus warfarin related coagulopathy  Resultant left gaze preference dysarthria right facial droop and right hemiplegia  CT head - acute hemorrhage within the left pons  LDL - 91  HgbA1c - 5.8  VTE prophylaxis - SCDs Fall precautions DIET - DYS 1 Room service appropriate? Yes; Fluid consistency: Nectar Thick. Has tube feedings  warfarin daily prior to admission, now on No antithrombotic  Ongoing aggressive stroke risk factor management  Therapy recommendations:  SNF  Disposition:  SNF  Atrial Fib w/ RVR  HR 100-140s with prn labetalol  Increased Metoprolol to 50 mg BID, 11/02/2017  Continue to monitor closely, PRN IV Metoprolol as needed  Hypertension Home dose  Cardizem restarted 11/02/2017 Long-term BP goal normotensive  Continue to monitor closely, PRN IV Metoprolol as needed  Hyperlipidemia  Home meds: Lipitor 20 mg daily prior to admission not resumed in hospital  LDL 91, goal < 70  Resume statin at discharge  Other Stroke Risk Factors  Advanced age  Atrial fibrillation  Dysphagia - SLP following, Cortrak/TF's in progress. Feeding assistance with all meals. Ensure added TID  Hospital day # 6  VTE prophylaxis: SCD's  Diet : Fall precautions DIET - DYS 1 Room service appropriate? Yes; Fluid consistency: Nectar Thick   FAMILY UPDATES: family at bedside  TEAM UPDATES:  Micki Riley, MD STATUS:  DNR    Prior Home Stroke Medications:  warfarin daily  Discharge Stroke Meds:  Please discharge patient on No antithrombotic due to ICH   Disposition:  Therapy Recs:               SNF, [facility], insurance pending Home Equipment:         PENDING Follow Up:  Follow-up Information    Micki Riley, MD. Schedule an appointment as soon as possible for a visit in 6 week(s).   Specialties:  Neurology, Radiology Contact information: [address] [place] [address] [phone]          Patient, No Pcp Per -PCP Follow up in 1-2 weeks    Case Management aware of need    Beryl Meager  Neurology Stroke Team 11/02/2017 1:10 PM   I have personally examined this patient, reviewed notes, independently viewed imaging studies, participated in medical decision making and plan of care.ROS completed by me personally and pertinent positives fully documented  I have made any additions or clarifications directly to the above note. Agree with note above.   10/31/2017 ATTENDING ASSESSMENT AND PLAN Increase dose of metoprolol and add Cardizem for better rate control. I had a long discussion about his condition with family at the bedside yesterday and they agreed to DO NOT RESUSCITATE and would like to follow his course over  the next few days and decide about PEG tube.   11/01/2017 ATTENDING ASSESSMENT AND PLAN I have personally examined this patient, reviewed notes, independently viewed imaging studies, participated in medical decision making and plan of care.ROS completed by me personally and pertinent positives fully documented  I have made any additions or clarifications directly to the above note. Agree with note above. Discussed with wife and family at the bedside and answered questions. Plan to keep panda tube for at least 24 hours to make sure that the patient can eat enough. Expect transfer to rehabilitation in the next few days  11/02/2017 ATTENDING ASSESSMENT AND PLAN Patient has been eating well but appears to be getting little agitated. 2 feeds have been stopped but will leave panda tube for a day or 2 to make sure that he is eating enough. Family not available at the bedside for discussion. Start low dose Seroquel 12.5 mg via agitation. Greater than 50% time during this 25 minute visit was spent on counseling and coordination of care about his hemorrhage, dysphagia and answering questions  Delia Heady, MD Medical Director [facility] Pager: [phone] 11/02/2017 1:10 PM   To contact Stroke Continuity provider, please refer to [email or web address]. After hours, contact General Neurology
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
## 2017-11-02 NOTE — Progress Notes (Signed)
CSW met with patient and son at bedside to discuss patient's improvement and ability to transition to SNF for rehabilitation. CSW reminded patient's son of facility options, and patient's family would like to choose [facility]. CSW contacted Admissions at [facility] and requested that they initiate insurance authorization request through [employer].  CSW will continue to follow.  Laveda Abbe, [place] Clinical Social Worker [phone]

## 2017-11-03 ENCOUNTER — Inpatient Hospital Stay (HOSPITAL_COMMUNITY): Payer: Medicare HMO

## 2017-11-03 DIAGNOSIS — R1312 Dysphagia, oropharyngeal phase: Secondary | ICD-10-CM

## 2017-11-03 LAB — GLUCOSE, CAPILLARY
GLUCOSE-CAPILLARY: 106 mg/dL — AB (ref 65–99)
GLUCOSE-CAPILLARY: 118 mg/dL — AB (ref 65–99)
GLUCOSE-CAPILLARY: 130 mg/dL — AB (ref 65–99)
GLUCOSE-CAPILLARY: 136 mg/dL — AB (ref 65–99)
Glucose-Capillary: 121 mg/dL — ABNORMAL HIGH (ref 65–99)
Glucose-Capillary: 122 mg/dL — ABNORMAL HIGH (ref 65–99)

## 2017-11-03 LAB — BASIC METABOLIC PANEL
ANION GAP: 10 (ref 5–15)
BUN: 28 mg/dL — ABNORMAL HIGH (ref 6–20)
CO2: 22 mmol/L (ref 22–32)
Calcium: 8.5 mg/dL — ABNORMAL LOW (ref 8.9–10.3)
Chloride: 108 mmol/L (ref 101–111)
Creatinine, Ser: 1.07 mg/dL (ref 0.61–1.24)
Glucose, Bld: 149 mg/dL — ABNORMAL HIGH (ref 65–99)
POTASSIUM: 3.7 mmol/L (ref 3.5–5.1)
SODIUM: 140 mmol/L (ref 135–145)

## 2017-11-03 LAB — CBC
HCT: 41.3 % (ref 39.0–52.0)
Hemoglobin: 13.8 g/dL (ref 13.0–17.0)
MCH: 34.1 pg — ABNORMAL HIGH (ref 26.0–34.0)
MCHC: 33.4 g/dL (ref 30.0–36.0)
MCV: 102 fL — AB (ref 78.0–100.0)
PLATELETS: 165 10*3/uL (ref 150–400)
RBC: 4.05 MIL/uL — AB (ref 4.22–5.81)
RDW: 13.2 % (ref 11.5–15.5)
WBC: 11.9 10*3/uL — AB (ref 4.0–10.5)

## 2017-11-03 MED ORDER — PANTOPRAZOLE SODIUM 40 MG PO TBEC
40.0000 mg | DELAYED_RELEASE_TABLET | Freq: Every day | ORAL | Status: DC
  Administered 2017-11-03 – 2017-11-06 (×4): 40 mg via ORAL
  Filled 2017-11-03 (×4): qty 1

## 2017-11-03 MED ORDER — LABETALOL HCL 5 MG/ML IV SOLN
10.0000 mg | INTRAVENOUS | Status: DC | PRN
  Administered 2017-11-03 – 2017-11-05 (×4): 10 mg via INTRAVENOUS
  Filled 2017-11-03 (×4): qty 4

## 2017-11-03 NOTE — Progress Notes (Signed)
STROKE TEAM PROGRESS NOTE   SUBJECTIVE (INTERVAL HISTORY) Multiple family members are at bedside. Pt still has NG tube. Family said he denied much for breakfast and lunch, they will try to feed him for dinner.  Ordered calorie count for nutrition purposes.  Pending SNF, but need to have better p.o.  Results for orders placed or performed during the hospital encounter of 10/27/17 (from the past 24 hour(s))  Glucose, capillary     Status: Abnormal   Collection Time: 11/02/17 11:21 AM  Result Value Ref Range   Glucose-Capillary 147 (H) 65 - 99 mg/dL  Glucose, capillary     Status: None   Collection Time: 11/02/17  4:40 PM  Result Value Ref Range   Glucose-Capillary 99 65 - 99 mg/dL  Glucose, capillary     Status: None   Collection Time: 11/02/17  8:09 PM  Result Value Ref Range   Glucose-Capillary 89 65 - 99 mg/dL   Comment 1 Notify RN   Glucose, capillary     Status: Abnormal   Collection Time: 11/03/17 12:24 AM  Result Value Ref Range   Glucose-Capillary 106 (H) 65 - 99 mg/dL   Comment 1 Notify RN   Glucose, capillary     Status: Abnormal   Collection Time: 11/03/17  4:47 AM  Result Value Ref Range   Glucose-Capillary 121 (H) 65 - 99 mg/dL   Comment 1 Notify RN   Basic metabolic panel     Status: Abnormal   Collection Time: 11/03/17  7:24 AM  Result Value Ref Range   Sodium 140 135 - 145 mmol/L   Potassium 3.7 3.5 - 5.1 mmol/L   Chloride 108 101 - 111 mmol/L   CO2 22 22 - 32 mmol/L   Glucose, Bld 149 (H) 65 - 99 mg/dL   BUN 28 (H) 6 - 20 mg/dL   Creatinine, Ser 1.611.07 0.61 - 1.24 mg/dL   Calcium 8.5 (L) 8.9 - 10.3 mg/dL   GFR calc non Af Amer >60 >60 mL/min   GFR calc Af Amer >60 >60 mL/min   Anion gap 10 5 - 15   PHYSICAL EXAM Vitals:   11/03/17 0143 11/03/17 0200 11/03/17 0405 11/03/17 0600  BP:  136/64 107/62 130/73  Pulse:  85 91   Resp:  (!) 21  (!) 23  Temp:   (!) 96.8 F (36 C)   TempSrc:   Axillary   SpO2:  94%  97%  Weight: 166 lb 3.6 oz (75.4 kg)      Height:         Pleasant frail  elderly Caucasian male currently not in distress. Afebrile. Head is nontraumatic. Neck is supple without bruit. Cardiac exam no murmur or gallop. Lungs are clear to auscultation. Distal pulses are well felt. Neurological Exam :  Drowsy but can be aroused.  Up in chair in NAD. Moderate dysarthria .  Left gaze improved. Blinks to threat on the left but not so on the right.  Follows commands well.  Right lower facial weakness.  Tongue midline.  Motor system exam shows dense right hemiplegia with only trace withdrawal to painful stimuli in the right upper and lower extremity.  Purposeful antigravity strength on the left without weakness.  Diminished sensation on the right hemibody compared to the left.  Tone is diminished on the right compared to the left.  Right plantar upgoing left downgoing.  Gait not tested.  IMAGING: I have personally reviewed the radiological images below and agree with the radiology  interpretations.  CT HEAD WITHOUT CONTRAST 10/27/2017 10:53 IMPRESSION: 1. Acute hemorrhage within the left pons, relatively large at up to 20 mm diameter (estimated blood volume 2 mL). No extra-axial or intraventricular extension at this time. No significant intracranial mass effect.  No ventriculomegaly. 2. ASPECTS is not applicable, acute hemorrhage.  CT HEAD WITHOUT CONTRAST 10/27/2017 15:53 IMPRESSION: 1. Acute hemorrhage in the left pons is stable since 1045 hours today, blood volume estimated at 2 mL. 2. No new intracranial abnormality.  PORTABLE CHEST 1 VIEW 10/30/2017 00:17 IMPRESSION: Hypoventilatory chest with mild bibasilar atelectasis, which may be secondary to low lung volumes or seen with aspiration. No confluent airspace disease. Unchanged cardiomegaly  ASSESSMENT/PLAN Mr. DAEMIEN FRONCZAK is a 84 y.o. male with history of atrial fibrillation on Coumadin, hypertension and hyperlipidemia, presenting with acute onset right-sided  weakness, facial droop, dysarthria, and left gaze preference. 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 Etiology indeterminate underlying cavernoma versus hypertensive plus warfarin related coagulopathy  Resultant dysarthria right facial droop and right hemiplegia  CT head - acute hemorrhage within the left pons  LDL - 91  HgbA1c - 5.8  VTE prophylaxis - SCDs Fall precautions DIET - DYS 1 Room service appropriate? Yes; Fluid consistency: Nectar Thick. Has tube feedings  warfarin daily prior to admission, now on No antithrombotic  Ongoing aggressive stroke risk factor management  Therapy recommendations:  SNF  Disposition:  SNF  Atrial Fib w/ RVR  HR 100-140s with prn labetalol  Increased Metoprolol to 50 mg BID, 11/02/2017  Continue to monitor closely, PRN IV Metoprolol as needed  Hypertension Home dose Cardizem restarted 11/02/2017 Long-term BP goal normotensive  Continue to monitor closely, PRN IV Metoprolol as needed  Hyperlipidemia  Home meds: Lipitor 20 mg daily prior to admission not resumed in hospital  LDL 91, goal < 70  Resume statin at discharge  Other Stroke Risk Factors  Advanced age  Atrial fibrillation  Dysphagia - SLP following, Cortrak/TF's in progress. Feeding assistance with all meals. Ensure added TID  Hospital day # 7  VTE prophylaxis: SCD's  Diet : Fall precautions DIET - DYS 1 Room service appropriate? Yes; Fluid consistency: Nectar Thick   FAMILY UPDATES: family at bedside  TEAM UPDATES: Micki Riley, MD STATUS:  DNR    Prior Home Stroke Medications:  warfarin daily  Discharge Stroke Meds:  Please discharge patient on No antithrombotic due to ICH   Disposition:  Therapy Recs:               SNF, [facility], insurance pending Home Equipment:         PENDING Follow Up:  Follow-up Information    Micki Riley, MD. Schedule an appointment as soon as possible for a  visit in 6 week(s).   Specialties:  Neurology, Radiology Contact information: [address] [place] [address] [phone]          Patient, No Pcp Per -PCP Follow up in 1-2 weeks    Case Management aware of need    Marvel Plan, MD PhD Stroke Neurology 11/03/2017 6:29 PM   To contact Stroke Continuity provider, please refer to [email or web address]. After hours, contact General Neurology

## 2017-11-03 NOTE — Progress Notes (Signed)
Pt was so drowsy unable to take   Po  Made MD ware and continue with TF pt tolerated well  Mouth care done prn Pt remain arousable and follow some simple command . HR  In 90's and low 100's will continue to monitor .

## 2017-11-03 NOTE — Progress Notes (Signed)
Patients HR 137, BP 168/111, RR 10 and up to 26, Sats 98% . Awaiting Call back.

## 2017-11-03 NOTE — Progress Notes (Signed)
Order received from MD. Administered labetolol 10mg . Assessed NG tube, which is patent and running Jevity at 60cc/hr. Patient denies pain. Will continue to monitor. CBG 122. Bed alarm on. Safety maintained.

## 2017-11-04 DIAGNOSIS — E785 Hyperlipidemia, unspecified: Secondary | ICD-10-CM

## 2017-11-04 LAB — BASIC METABOLIC PANEL
ANION GAP: 9 (ref 5–15)
BUN: 24 mg/dL — ABNORMAL HIGH (ref 6–20)
CO2: 24 mmol/L (ref 22–32)
Calcium: 8.5 mg/dL — ABNORMAL LOW (ref 8.9–10.3)
Chloride: 108 mmol/L (ref 101–111)
Creatinine, Ser: 0.92 mg/dL (ref 0.61–1.24)
Glucose, Bld: 128 mg/dL — ABNORMAL HIGH (ref 65–99)
POTASSIUM: 3.5 mmol/L (ref 3.5–5.1)
SODIUM: 141 mmol/L (ref 135–145)

## 2017-11-04 LAB — GLUCOSE, CAPILLARY
GLUCOSE-CAPILLARY: 142 mg/dL — AB (ref 65–99)
GLUCOSE-CAPILLARY: 144 mg/dL — AB (ref 65–99)
GLUCOSE-CAPILLARY: 92 mg/dL (ref 65–99)
Glucose-Capillary: 125 mg/dL — ABNORMAL HIGH (ref 65–99)
Glucose-Capillary: 93 mg/dL (ref 65–99)

## 2017-11-04 LAB — CBC
HCT: 42.3 % (ref 39.0–52.0)
HEMOGLOBIN: 14.2 g/dL (ref 13.0–17.0)
MCH: 34 pg (ref 26.0–34.0)
MCHC: 33.6 g/dL (ref 30.0–36.0)
MCV: 101.2 fL — ABNORMAL HIGH (ref 78.0–100.0)
PLATELETS: 192 10*3/uL (ref 150–400)
RBC: 4.18 MIL/uL — AB (ref 4.22–5.81)
RDW: 13.4 % (ref 11.5–15.5)
WBC: 9.7 10*3/uL (ref 4.0–10.5)

## 2017-11-04 MED ORDER — DILTIAZEM HCL ER COATED BEADS 240 MG PO CP24: 240.0000 mg | ORAL_CAPSULE | Freq: Every day | ORAL | Status: DC

## 2017-11-04 NOTE — Progress Notes (Signed)
STROKE TEAM PROGRESS NOTE   SUBJECTIVE (INTERVAL HISTORY) No family is at bedside. Pt still has NG tube.  As per nurse, patient ate 50% of breakfast and lunch.  To facilitate dye nutrition, will DC tube feeding at this time.  Continue calorie count and dietitian consult.  Currently heart rate in good control, but at night has intermittent RVR, increase Cardizem to 240 mg.  Results for orders placed or performed during the hospital encounter of 10/27/17 (from the past 24 hour(s))  Glucose, capillary     Status: Abnormal   Collection Time: 11/03/17  8:00 AM  Result Value Ref Range   Glucose-Capillary 130 (H) 65 - 99 mg/dL   Comment 1 Notify RN    Comment 2 Document in Chart   Glucose, capillary     Status: Abnormal   Collection Time: 11/03/17 11:38 AM  Result Value Ref Range   Glucose-Capillary 136 (H) 65 - 99 mg/dL   Comment 1 Notify RN    Comment 2 Document in Chart   Glucose, capillary     Status: Abnormal   Collection Time: 11/03/17  4:24 PM  Result Value Ref Range   Glucose-Capillary 118 (H) 65 - 99 mg/dL   Comment 1 Notify RN    Comment 2 Document in Chart   Glucose, capillary     Status: Abnormal   Collection Time: 11/03/17  8:49 PM  Result Value Ref Range   Glucose-Capillary 122 (H) 65 - 99 mg/dL  Glucose, capillary     Status: Abnormal   Collection Time: 11/04/17 12:08 AM  Result Value Ref Range   Glucose-Capillary 142 (H) 65 - 99 mg/dL   Comment 1 Notify RN   Glucose, capillary     Status: Abnormal   Collection Time: 11/04/17  4:05 AM  Result Value Ref Range   Glucose-Capillary 125 (H) 65 - 99 mg/dL   Comment 1 Notify RN   CBC     Status: Abnormal   Collection Time: 11/04/17  4:59 AM  Result Value Ref Range   WBC 9.7 4.0 - 10.5 K/uL   RBC 4.18 (L) 4.22 - 5.81 MIL/uL   Hemoglobin 14.2 13.0 - 17.0 g/dL   HCT 24.442.3 01.039.0 - 27.252.0 %   MCV 101.2 (H) 78.0 - 100.0 fL   MCH 34.0 26.0 - 34.0 pg   MCHC 33.6 30.0 - 36.0 g/dL   RDW 53.613.4 64.411.5 - 03.415.5 %   Platelets 192 150 - 400  K/uL   PHYSICAL EXAM Vitals:   11/03/17 1800 11/03/17 2010 11/04/17 0031 11/04/17 0400  BP: (!) 146/80 (!) 168/111 (!) 144/78 124/76  Pulse:  (!) 137 87 86  Resp:      Temp:  98.6 F (37 C) 98 F (36.7 C) 98.3 F (36.8 C)  TempSrc:  Axillary Axillary Axillary  SpO2:      Weight:    170 lb 10.2 oz (77.4 kg)  Height:         Pleasant frail  elderly Caucasian male currently not in distress. Afebrile. Head is nontraumatic. Neck is supple without bruit. Cardiac exam no murmur or gallop, persistent A. fib rhythm. Lungs are clear to auscultation.  Neurological Exam :  Awake alert, follows simple commands.  Lying in bed in NAD.  Severe dysarthria with expressive aphasia.  Left gaze improved. Blinks to threat on the left but not so on the right.  Follows commands well.  Right lower facial weakness.  Tongue midline.  Motor system exam shows dense right  hemiplegia with only trace withdrawal to painful stimuli in the right upper and lower extremity.  Purposeful antigravity strength on the left without weakness.  Diminished sensation on the right hemibody compared to the left.  Tone is diminished on the right compared to the left.  Right plantar upgoing with triple reflex and left downgoing.  Gait not tested.  IMAGING: I have personally reviewed the radiological images below and agree with the radiology interpretations.  CT HEAD WITHOUT CONTRAST 10/27/2017 10:53 IMPRESSION: 1. Acute hemorrhage within the left pons, relatively large at up to 20 mm diameter (estimated blood volume 2 mL). No extra-axial or intraventricular extension at this time. No significant intracranial mass effect.  No ventriculomegaly. 2. ASPECTS is not applicable, acute hemorrhage.  CT HEAD WITHOUT CONTRAST 10/27/2017 15:53 IMPRESSION: 1. Acute hemorrhage in the left pons is stable since 1045 hours today, blood volume estimated at 2 mL. 2. No new intracranial abnormality.  PORTABLE CHEST 1 VIEW 10/30/2017  00:17 IMPRESSION: Hypoventilatory chest with mild bibasilar atelectasis, which may be secondary to low lung volumes or seen with aspiration. No confluent airspace disease. Unchanged cardiomegaly  Dg Chest Port 1 View  Result Date: 11/03/2017 CLINICAL DATA:  Congestive heart failure. EXAM: PORTABLE CHEST 1 VIEW COMPARISON:  October 29, 2017 FINDINGS: Cardiomegaly. The hila and mediastinum are unchanged. No pneumothorax. A feeding tube terminates below today's film. The lungs are clear. No other acute abnormalities. IMPRESSION: No active disease. Electronically Signed   By: Gerome Sam III M.D   On: 11/03/2017 08:03    ASSESSMENT/PLAN [name] is a 84 y.o. male with history of atrial fibrillation on Coumadin, hypertension and hyperlipidemia, presenting with acute onset right-sided weakness, facial droop, dysarthria, and left gaze preference. 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 Etiology indeterminate underlying cavernoma versus hypertensive plus warfarin related coagulopathy  Resultant dysarthria right facial droop and right hemiplegia  CT head - acute hemorrhage within the left pons  LDL - 91  HgbA1c - 5.8  VTE prophylaxis - SCDs Fall precautions DIET - DYS 1 Room service appropriate? Yes; Fluid consistency: Nectar Thick.  Stopped  tube feedings  warfarin daily prior to admission, now on No antithrombotic  Ongoing aggressive stroke risk factor management  Therapy recommendations:  SNF  Disposition:  SNF  Atrial Fib w/ RVR  Intermittent RVR  Increased Metoprolol to 50 mg BID, 11/02/2017  Increased Cardizem to 2040 mg, 11/04/17  Continue to monitor closely, PRN IV Metoprolol as needed  Hypertension Home meds of Cardizem and metoprolol Long-term BP goal normotensive  Continue to monitor closely, PRN IV Metoprolol as needed  Hyperlipidemia  Home meds: Lipitor 20 mg daily prior to admission not  resumed in hospital  LDL 91, goal < 70  Resume statin at discharge  Dysphagia  SLP following   Still has Cortrak  Able to eat 50% of meal  Stopped tube feeding   Calorie count  Dietitian consult  Feeding assistance with all meals. Ensure TID  Other Stroke Risk Factors  Advanced age  Hospital day # 8  VTE prophylaxis: SCD's  Diet : Fall precautions DIET - DYS 1 Room service appropriate? Yes; Fluid consistency: Nectar Thick   FAMILY UPDATES: family at bedside  STATUS:  DNR    Prior Home Stroke Medications:  warfarin daily  Discharge Stroke Meds:  Please discharge patient on No antithrombotic due to ICH   Disposition:  Therapy Recs:  SNF, [employer], insurance pending Home Equipment:         PENDING Follow Up:  Follow-up Information    Micki Riley, MD. Schedule an appointment as soon as possible for a visit in 6 week(s).   Specialties:  Neurology, Radiology Contact information: [address] [place] [address] [phone]          Patient, No Pcp Per -PCP Follow up in 1-2 weeks    Case Management aware of need    Marvel Plan, MD PhD Stroke Neurology 11/04/2017 3:05 PM  To contact Stroke Continuity provider, please refer to [email or web address]. After hours, contact General Neurology

## 2017-11-05 ENCOUNTER — Telehealth: Payer: Self-pay | Admitting: Interventional Cardiology

## 2017-11-05 ENCOUNTER — Inpatient Hospital Stay (HOSPITAL_COMMUNITY): Payer: Medicare HMO

## 2017-11-05 DIAGNOSIS — I4891 Unspecified atrial fibrillation: Secondary | ICD-10-CM

## 2017-11-05 DIAGNOSIS — D72829 Elevated white blood cell count, unspecified: Secondary | ICD-10-CM

## 2017-11-05 DIAGNOSIS — I484 Atypical atrial flutter: Secondary | ICD-10-CM

## 2017-11-05 DIAGNOSIS — I10 Essential (primary) hypertension: Secondary | ICD-10-CM

## 2017-11-05 DIAGNOSIS — I4892 Unspecified atrial flutter: Secondary | ICD-10-CM

## 2017-11-05 LAB — BASIC METABOLIC PANEL
Anion gap: 11 (ref 5–15)
BUN: 19 mg/dL (ref 6–20)
CALCIUM: 9 mg/dL (ref 8.9–10.3)
CO2: 23 mmol/L (ref 22–32)
CREATININE: 0.95 mg/dL (ref 0.61–1.24)
Chloride: 106 mmol/L (ref 101–111)
GFR calc Af Amer: >60 mL/min (ref >60)
GFR calc non Af Amer: >60 mL/min (ref >60)
GLUCOSE: 139 mg/dL — AB (ref 65–99)
Potassium: 3.8 mmol/L (ref 3.5–5.1)
Sodium: 140 mmol/L (ref 135–145)

## 2017-11-05 LAB — CBC
HCT: 47.4 % (ref 39.0–52.0)
Hemoglobin: 16.3 g/dL (ref 13.0–17.0)
MCH: 34.5 pg — AB (ref 26.0–34.0)
MCHC: 34.4 g/dL (ref 30.0–36.0)
MCV: 100.4 fL — AB (ref 78.0–100.0)
PLATELETS: 231 10*3/uL (ref 150–400)
RBC: 4.72 MIL/uL (ref 4.22–5.81)
RDW: 13 % (ref 11.5–15.5)
WBC: 13.4 10*3/uL — AB (ref 4.0–10.5)

## 2017-11-05 LAB — URINALYSIS, ROUTINE W REFLEX MICROSCOPIC
Bilirubin Urine: NEGATIVE
Glucose, UA: NEGATIVE mg/dL
KETONES UR: NEGATIVE mg/dL
Leukocytes, UA: NEGATIVE
Nitrite: NEGATIVE
PROTEIN: 30 mg/dL — AB
Specific Gravity, Urine: 1.009 (ref 1.005–1.030)
pH: 8 (ref 5.0–8.0)

## 2017-11-05 LAB — GLUCOSE, CAPILLARY
Glucose-Capillary: 118 mg/dL — ABNORMAL HIGH (ref 65–99)
Glucose-Capillary: 122 mg/dL — ABNORMAL HIGH (ref 65–99)
Glucose-Capillary: 135 mg/dL — ABNORMAL HIGH (ref 65–99)

## 2017-11-05 MED ORDER — SODIUM CHLORIDE 0.9 % IV BOLUS (SEPSIS)
500.0000 mL | Freq: Once | INTRAVENOUS | Status: AC
  Administered 2017-11-05: 500 mL via INTRAVENOUS

## 2017-11-05 MED ORDER — LABETALOL HCL 5 MG/ML IV SOLN: 10.0000 mg | Freq: Once | INTRAVENOUS | Status: DC

## 2017-11-05 MED ORDER — DILTIAZEM LOAD VIA INFUSION
15.0000 mg | Freq: Once | INTRAVENOUS | Status: AC
  Administered 2017-11-05: 15 mg via INTRAVENOUS
  Filled 2017-11-05: qty 15

## 2017-11-05 MED ORDER — METOPROLOL TARTRATE 5 MG/5ML IV SOLN
5.0000 mg | Freq: Once | INTRAVENOUS | Status: AC
  Administered 2017-11-05: 5 mg via INTRAVENOUS
  Filled 2017-11-05: qty 5

## 2017-11-05 MED ORDER — ATORVASTATIN CALCIUM 40 MG PO TABS
40.0000 mg | ORAL_TABLET | Freq: Every day | ORAL | Status: DC
  Administered 2017-11-05 – 2017-11-07 (×3): 40 mg via NASOGASTRIC
  Filled 2017-11-05 (×4): qty 1

## 2017-11-05 MED ORDER — METOPROLOL TARTRATE 5 MG/5ML IV SOLN
2.5000 mg | Freq: Once | INTRAVENOUS | Status: AC
  Administered 2017-11-05: 2.5 mg via INTRAVENOUS
  Filled 2017-11-05: qty 5

## 2017-11-05 MED ORDER — DILTIAZEM HCL 100 MG IV SOLR
5.0000 mg/h | INTRAVENOUS | Status: DC
  Administered 2017-11-05: 5 mg/h via INTRAVENOUS
  Administered 2017-11-05: 7.5 mg/h via INTRAVENOUS
  Administered 2017-11-06: 10 mg/h via INTRAVENOUS
  Filled 2017-11-05 (×3): qty 100

## 2017-11-05 MED ORDER — METOPROLOL TARTRATE 5 MG/5ML IV SOLN: 2.5000 mg | INTRAVENOUS | Status: DC | PRN

## 2017-11-05 NOTE — Progress Notes (Signed)
Cardizem bolus  ( ) was given (see flow sheet for vitals)

## 2017-11-05 NOTE — Progress Notes (Signed)
  CARDIZEM titrated to 3315mL/hr see flow sheet for vital signs

## 2017-11-05 NOTE — Progress Notes (Signed)
Called rapid  Response about pt's sustained HR, suggested EKG and the page provider again

## 2017-11-05 NOTE — Progress Notes (Signed)
Overnight cross coverage note  Was paged about patient sustained A. fib with RVR. Heart rates ranging from 100-160 Recommended metoprolol 2.5 mg IV x1.  Pt Continued to sustain RVR. Ordered additional metoprolol 5 mg IV x1 after curbsiding medicine.  Patient's RN concerned about the continuing RVR, called rapid response. RRT recommended EKG. EKG done.  Shows A. fib with RVR.  Variable AV block and PVCs.  Contacted Dr. Julian ReilGardner for a formal hospitalist consult.  Would appreciate help with management of the A. fib with RVR.  Will follow IM recs.  Stroke team to follow in the morning  -- Milon DikesAshish Olivette Beckmann, MD [facility] Pager: [phone] If 7pm to 7am, please call on call as listed on AMION.

## 2017-11-05 NOTE — Progress Notes (Signed)
Physical Therapy Treatment Patient Details Name: Marthe PatchCarlton R Brogdon MRN: [id number] DOB: [birth year] Today's Date: 11/05/2017    History of Present Illness Pt is an 84 y/o male with a PMH significant for HTN, a-flutter, a-fib. As he was about to climb a ladder he suddenly slumped to one side and EMS was called. CT revealed large ICH L pons.     PT Comments    Patient not progressing towards PT goals today. Requires more assist for static sitting balance and demonstrates heavy posterior and right lateral lean. Question if this is due to deconditioning and being in bed all weekend? Difficulty with initiating and sequencing movements despite max cues and increased time. Tolerated sitting balance activities with external support, resistance to anterior weight shift and SPT to chair with total A of 2. HR stayed <100 bpm during session. Continues to be difficult to understand speech due to dysarthria. Appropriate for SNF level of care. Will follow.   Follow Up Recommendations  SNF;Supervision/Assistance - 24 hour     Equipment Recommendations  None recommended by PT    Recommendations for Other Services       Precautions / Restrictions Precautions Precautions: Fall Precaution Comments: R sided weakness; NG tube Restrictions Weight Bearing Restrictions: No    Mobility  Bed Mobility Overal bed mobility: Needs Assistance Bed Mobility: Supine to Sit     Supine to sit: Max assist;HOB elevated;+2 for physical assistance     General bed mobility comments: Able to initiate LLE minimally today; reaching for therapist to pull up to sitting, assist for RLE, trunk and to scoot bottom to EOB. Heavy posterior lean and to the right.  Transfers Overall transfer level: Needs assistance Equipment used: 2 person hand held assist(gait belt with pad) Transfers: Sit to/from BJ'sStand;Stand Pivot Transfers Sit to Stand: Total assist;+2 physical assistance;From elevated surface Stand pivot transfers: Total  assist;+2 physical assistance;From elevated surface       General transfer comment: Cues for anterior weight shift and forward translation to stand from EOB x1; not initiating anterior weight well; SPT bed to chair with total A of 2. HR stable <100 bpm.  Ambulation/Gait             General Gait Details: unable   Stairs            Wheelchair Mobility    Modified Rankin (Stroke Patients Only) Modified Rankin (Stroke Patients Only) Pre-Morbid Rankin Score: No symptoms Modified Rankin: Severe disability     Balance Overall balance assessment: Needs assistance Sitting-balance support: Feet supported;Single extremity supported Sitting balance-Leahy Scale: Poor Sitting balance - Comments: Not able to sit EOB without external support today, heavy lean posterior and to the right. Balance reactions noted but decreased core strength.  Postural control: Posterior lean;Right lateral lean Standing balance support: During functional activity Standing balance-Leahy Scale: Zero Standing balance comment: Total A for static standing and transfer.                            Cognition Arousal/Alertness: Awake/alert Behavior During Therapy: Flat affect Overall Cognitive Status: Difficult to assess                         Following Commands: Follows one step commands with increased time;Follows one step commands inconsistently     Problem Solving: Requires verbal cues;Difficulty sequencing General Comments: Able to follow commands with increased time and cues inconsistently. Repetition needed.  Exercises      General Comments        Pertinent Vitals/Pain Pain Assessment: Faces Faces Pain Scale: No hurt    Home Living                      Prior Function            PT Goals (current goals can now be found in the care plan section) Progress towards PT goals: Not progressing toward goals - comment(possibly due to deconditioning?)     Frequency    Min 3X/week      PT Plan Current plan remains appropriate    Co-evaluation              AM-PAC PT "6 Clicks" Daily Activity  Outcome Measure  Difficulty turning over in bed (including adjusting bedclothes, sheets and blankets)?: Unable Difficulty moving from lying on back to sitting on the side of the bed? : Unable Difficulty sitting down on and standing up from a chair with arms (e.g., wheelchair, bedside commode, etc,.)?: Unable Help needed moving to and from a bed to chair (including a wheelchair)?: Total Help needed walking in hospital room?: Total Help needed climbing 3-5 steps with a railing? : Total 6 Click Score: 6    End of Session Equipment Utilized During Treatment: Gait belt Activity Tolerance: Patient limited by fatigue Patient left: in chair;with call bell/phone within reach;with chair alarm set Nurse Communication: Mobility status;Need for lift equipment PT Visit Diagnosis: Hemiplegia and hemiparesis Hemiplegia - Right/Left: Right Hemiplegia - dominant/non-dominant: Dominant Hemiplegia - caused by: Nontraumatic intracerebral hemorrhage     Time: 0950-1011 PT Time Calculation (min) (ACUTE ONLY): 21 min  Charges:  $Therapeutic Activity: 8-22 mins                    G Codes:       Mylo Red, PT, DPT [phone]     Blake Divine A Sherri Levenhagen 11/05/2017, 11:23 AM

## 2017-11-05 NOTE — Progress Notes (Signed)
  Speech Language Pathology Treatment: Dysphagia  Patient Details Name: [name] MRN: [id number] DOB: 06-10-1930 Today's Date: 11/05/2017 Time: [id number] SLP Time Calculation (min) (ACUTE ONLY): 25 min  Assessment / Plan / Recommendation Clinical Impression  Pt was seen at bedside to follow up on diet tolerance and education. Upon arrival of SLP, pt was noted to have a wet voice quality. Significant encouragement and cueing was required for pt to clear his throat or cough on command. Once voice quality cleared with cough, pt was provided with small bolus of magic cup. Extended oral prep and suspected delayed swallow reflex with immediate change in vocal quality noted. SLP used suction to facilitate clearing of oral cavity after the swallow of magic cup, and continued to encourage pt to clear throat. Family was present and was also encouraged to monitor voice quality - if wet sounding, do not provide po intake, and encourage pt to cough/clear throat. Education provided regarding what a "wet voice" means. Pt tolerance of po intake continues to be questionable, and his alertness is variable. PO intake is also highly variable per RN. ST will continue to follow for assessment of diet tolerance and continued education. Recommend consideration of Palliative Care consult if pt po intake and tolerance of conservative diet continue to be concerning.   HPI HPI: Patient is a 84 year old male with a history of atrial fibrillation, on Coumadin, hypertension, hyperlipidemia, GERD, Schatzki's ring, hiatal hernia who presented as a code stroke. He was reportedly working outside in his barn and was found by his family to be slumped over at the side of the barn. He has right-sided facial drooping and right side paralysis. CT revealed acute hemorrhage within the left pons, relatively large at up to 20 mm diameter. Per RN reported difficulty swallowing prior to admission.      SLP Plan  Continue with current plan  of care       Recommendations  Diet recommendations: Dysphagia 1 (puree);Nectar-thick liquid Liquids provided via: Teaspoon Medication Administration: Via alternative means Supervision: Staff to assist with self feeding;Full supervision/cueing for compensatory strategies Compensations: Slow rate;Small sips/bites;Monitor for anterior loss;Clear throat intermittently;Multiple dry swallows after each bite/sip;Chin tuck;Minimize environmental distractions Postural Changes and/or Swallow Maneuvers: Seated upright 90 degrees                Oral Care Recommendations: Oral care before and after PO Follow up Recommendations: Inpatient Rehab SLP Visit Diagnosis: Dysphagia, oropharyngeal phase (R13.12) Plan: Continue with current plan of care       GO              [name], [facility] LPMSP, CCC-SLP Speech Language Pathologist [phone]  [name], [name] 11/05/2017, 2:56 PM

## 2017-11-05 NOTE — Progress Notes (Signed)
Notified  Provider about Pt's ssutained BP and heart rate, he ordered 5mg  of metoPROLOL, administered

## 2017-11-05 NOTE — Progress Notes (Signed)
Patient on cardizem drip which has been titrated nine times in the last 15 hours.  Protocol is to transfer off floor if greater than 3 titrations is needed within 1-[facility] hours [facility]  Consulted with Mindy from rapid....titrating rate back to 10 adding a prn dose of labetalol to try and stabilize the patient.  If that is not successful patient will have to be transferred off floor.

## 2017-11-05 NOTE — Consult Note (Signed)
Medical Consultation   [name]  ZOX:[id number]  DOB: [birth year]  DOA: 10/27/2017  PCP: Patient, No Pcp Per   Outpatient Specialists: Dr. Katrinka Blazing (Cards)   Requesting physician: Dr. Wilford Corner  Reason for consultation: A.Fib RVR   History of Present Illness: [name] is an 84 y.o. male with h/o A.Fib on coumadin, HTN, HLD.  Patient was admitted on 3/2 with Large L pons ICH.  Anticoagulation was reversed.  Due to severe deficits NG tube has been placed, he is currently awaiting SNF placement.  His stay has been complicated by intermittent RVR of his chronic a.fib.  This has been resistant to PO cardizem, PO scheduled metoprolol and IV PRN metoprolol.  Hospitalist consulted.    Review of Systems:  ROS As per HPI otherwise 10 point review of systems negative.   Past Medical History: Past Medical History:  Diagnosis Date  . Atrial fibrillation (HCC)   . Atrial flutter (HCC)   . Atrial flutter (HCC)   . Edema 03/17/2009   Qualifier: Diagnosis of  By: Flonnie Overman    . GERD (gastroesophageal reflux disease)   . HTN (hypertension)   . Hypercholesteremia   . Hyperlipidemia     Past Surgical History: History reviewed. No pertinent surgical history.   Allergies:  No Known Allergies   Social History:  reports that  has never smoked. he has never used smokeless tobacco. He reports that he does not drink alcohol or use drugs.   Family History: Family History  Problem Relation Age of Onset  . Healthy Mother   . Other Father        "too much iron in blood"  . Healthy Sister        Physical Exam: Vitals:   11/05/17 0317 11/05/17 0400 11/05/17 0426 11/05/17 0428  BP: (!) 166/108 (!) 163/116  136/86  Pulse: (!) 126 (!) 149  (!) 151  Resp:      Temp:  97.8 F (36.6 C)    TempSrc:  Axillary    SpO2:  97%    Weight:   77 kg (169 lb 12.1 oz)   Height:        Constitutional: Alert and awake, follows simple commands Eyes: PERLA, EOMI,  irises appear normal, anicteric sclera,  ENMT: external ears and nose appear normal,            Lips appears normal, oropharynx mucosa, tongue, posterior pharynx appear normal  Neck: neck appears normal, no masses, normal ROM, no thyromegaly, no JVD  CVS: IRR,IRR Respiratory:  clear to auscultation bilaterally, no wheezing, rales or rhonchi. Respiratory effort normal. No accessory muscle use.  Abdomen: soft nontender, nondistended, normal bowel sounds, no hepatosplenomegaly, no hernias  Musculoskeletal: : no cyanosis, clubbing or edema noted bilaterally Neuro: Severe dysarthria with expressive aphasia.  R lower facial weakness.  Dense R hemiplegia Psych: judgement and insight appear normal, stable mood and affect, mental status Skin: no rashes or lesions or ulcers, no induration or nodules    Data reviewed:  I have personally reviewed following labs and imaging studies Labs:  CBC: Recent Labs  Lab 11/02/17 0338 11/03/17 0724 11/04/17 0459  WBC 12.5* 11.9* 9.7  HGB 15.4 13.8 14.2  HCT 44.9 41.3 42.3  MCV 100.2* 102.0* 101.2*  PLT 202 165 192    Basic Metabolic Panel: Recent Labs  Lab 10/29/17 1647 10/30/17 1024 10/31/17 8119  11/02/17 1478 11/03/17 2956  11/04/17 0459  NA  --   --   --   --  140 140 141  K  --   --   --    < > 3.7 3.7 3.5  CL  --   --   --   --  107 108 108  CO2  --   --   --   --  23 22 24   GLUCOSE  --   --   --   --  120* 149* 128*  BUN  --   --   --   --  22* 28* 24*  CREATININE  --   --   --   --  0.96 1.07 0.92  CALCIUM  --   --   --   --  8.9 8.5* 8.5*  MG  --  2.1 2.1  --  2.1  --   --   PHOS 2.0* 2.7 2.6  --   --   --   --    < > = values in this interval not displayed.   GFR Estimated Creatinine Clearance: 60.6 mL/min (by C-G formula based on SCr of 0.92 mg/dL). Liver Function Tests: No results for input(s): AST, ALT, ALKPHOS, BILITOT, PROT, ALBUMIN in the last 168 hours. No results for input(s): LIPASE, AMYLASE in the last 168 hours. No  results for input(s): AMMONIA in the last 168 hours. Coagulation profile Recent Labs  Lab 10/29/17 0344 10/30/17 1024  INR 1.15 1.26    Cardiac Enzymes: No results for input(s): CKTOTAL, CKMB, CKMBINDEX, TROPONINI in the last 168 hours. BNP: Invalid input(s): POCBNP CBG: Recent Labs  Lab 11/04/17 1127 11/04/17 1624 11/04/17 2017 11/05/17 0010 11/05/17 0358  GLUCAP 144* 92 93 122* 118*   D-Dimer No results for input(s): DDIMER in the last 72 hours. Hgb A1c No results for input(s): HGBA1C in the last 72 hours. Lipid Profile No results for input(s): CHOL, HDL, LDLCALC, TRIG, CHOLHDL, LDLDIRECT in the last 72 hours. Thyroid function studies No results for input(s): TSH, T4TOTAL, T3FREE, THYROIDAB in the last 72 hours.  Invalid input(s): FREET3 Anemia work up No results for input(s): VITAMINB12, FOLATE, FERRITIN, TIBC, IRON, RETICCTPCT in the last 72 hours. Urinalysis    Component Value Date/Time   COLORURINE YELLOW 11/05/2017 0331   APPEARANCEUR CLEAR 11/05/2017 0331   LABSPEC 1.009 11/05/2017 0331   PHURINE 8.0 11/05/2017 0331   GLUCOSEU NEGATIVE 11/05/2017 0331   HGBUR SMALL (A) 11/05/2017 0331   BILIRUBINUR NEGATIVE 11/05/2017 0331   KETONESUR NEGATIVE 11/05/2017 0331   PROTEINUR 30 (A) 11/05/2017 0331   NITRITE NEGATIVE 11/05/2017 0331   LEUKOCYTESUR NEGATIVE 11/05/2017 0331     Microbiology Recent Results (from the past 240 hour(s))  MRSA PCR Screening     Status: None   Collection Time: 10/27/17 12:43 PM  Result Value Ref Range Status   MRSA by PCR NEGATIVE NEGATIVE Final    Comment:        The GeneXpert MRSA Assay (FDA approved for NASAL specimens only), is one component of a comprehensive MRSA colonization surveillance program. It is not intended to diagnose MRSA infection nor to guide or monitor treatment for MRSA infections. Performed at [facility], [address] [place], [place] [address]        Inpatient Medications:     Scheduled Meds: . diltiazem  15 mg Intravenous Once  . sennosides  5 mL Per Tube BID   And  . docusate  50 mg Per Tube  BID  . feeding supplement (ENSURE ENLIVE)  237 mL Oral TID BM  . feeding supplement (PRO-STAT SUGAR FREE 64)  30 mL Per Tube Daily  . metoprolol tartrate  50 mg Per Tube BID  . pantoprazole  40 mg Oral QHS  . QUEtiapine  12.5 mg Oral BID  . RESOURCE THICKENUP CLEAR   Oral TID PC & HS   Continuous Infusions: . sodium chloride 40 mL/hr at 11/05/17 0300  . diltiazem (CARDIZEM) infusion       Radiological Exams on Admission: Dg Chest Port 1 View  Result Date: 11/03/2017 CLINICAL DATA:  Congestive heart failure. EXAM: PORTABLE CHEST 1 VIEW COMPARISON:  October 29, 2017 FINDINGS: Cardiomegaly. The hila and mediastinum are unchanged. No pneumothorax. A feeding tube terminates below today's film. The lungs are clear. No other acute abnormalities. IMPRESSION: No active disease. Electronically Signed   By: Gerome Sam III M.D   On: 11/03/2017 08:03    Impression/Recommendations Principal Problem:   ICH (intracerebral hemorrhage) (HCC) Active Problems:   Essential hypertension   Atrial flutter (HCC)   Atrial fibrillation with RVR (HCC)  1. ICH - management per neurology 2. A.fib / flutter with RVR - 1. Problem is that patient is converting in and out of flutter, and is going 2:1 when he is in flutter.  When he is in fib, he is actually reasonably rate controlled with rate of ~100-110. 2. Only temp response to IV lopressor 3. Will put patient on cardizem gtt to try and gain rate control.  Have put in cardizem gtt per a.fib / flutter protocol. 4. 15mg  cardizem bolus then titrate gtt 5. Holding PO cardizem for now. 6. Will leave on BID lopressor via PEG. 7. If cardizem gtt fails to gain rate control, then will need to consult cardiology as next step. 8. No anticoagulation obviously as he was admitted to hospital for catastrophic ICH. 3. HTN - 1. Continue  metoprolol 2. IV labetalol PRN hypertension (already ordered).   Thank you for this consultation.  Our [facility] hospitalist team will follow the patient with you.     GARDNER, JARED M. D.O. [facility] 11/05/2017, 4:37 AM

## 2017-11-05 NOTE — Progress Notes (Signed)
11/05/2017: Patient was seen and examined at his bedside this morning.  Patient is alert however he has dysarthria which makes it difficult to understand him.  In A. fib RVR this morning on Cardizem drip with uncontrolled hypertension.  10 mg of IV labetalol given once with improvement of blood pressure and heart rate.  Patient is asymptomatic.  Cortrack in place.  Resume home antihypertensive and rate control medications.  Weaning off Cardizem drip.  Cardiology following.    Please refer to consult note dictated by Dr. Julian ReilGardner on 11/05/2017 for further details of the assessment and plan.  Thank you for allowing us to participate in the care of this patient.  Please call with questions.  We will continue to follow this patient along with the neurology team.

## 2017-11-05 NOTE — Progress Notes (Signed)
CARDIZEM titration stated at 85ml/hr see flow sheet for  vitals signs

## 2017-11-05 NOTE — Progress Notes (Signed)
CARDIZEM titrated to 2110mL/hr  See flow sheet for vital signs

## 2017-11-05 NOTE — Progress Notes (Signed)
Nutrition Follow-up  DOCUMENTATION CODES:   Not applicable  INTERVENTION:  Continue Ensure Enlive po TID, each supplement provides 350 kcal and 20 grams of protein per MD - being thickened with resource  Continue Pro-stat 30mL daily, each supplement provides 100 calories and 15 grams of protein  NUTRITION DIAGNOSIS:   Inadequate oral intake related to dysphagia as evidenced by NPO status  GOAL:   Patient will meet greater than or equal to 90% of their needs  MONITOR:   Labs, Skin, I & O's, Weight trends, TF tolerance  ASSESSMENT:   [name] is a 84 y.o. male with history of atrial fibrillation on Coumadin, hypertension and hyperlipidemia, presenting with acute onset right-sided weakness, facial droop, dysarthria, and left gaze preference. He did not receive IV t-PA due to ICH  Calorie count initiated to determine if patient needs a PEG Only giving medications via NGT currently, no tube feed ordered. Discussed with RN, he has not consumed any food or ensure today. Alert, but with dysarthria. Weight up 3 pounds since admit RD will continue to monitor for calorie count.  Labs reviewed:  CBGs 118  Medications reviewed and include:  Senokot Ns at 6820mL/hr Cardizem gtt  Diet Order:  Fall precautions DIET - DYS 1 Room service appropriate? Yes; Fluid consistency: Nectar Thick  EDUCATION NEEDS:   Not appropriate for education at this time  Skin:  Skin Assessment: Reviewed RN Assessment  Last BM:  11/05/2017  Height:   Ht Readings from Last 1 Encounters:  10/27/17 5\' 10"  (1.778 m)    Weight:   Wt Readings from Last 1 Encounters:  11/05/17 169 lb 12.1 oz (77 kg)    Ideal Body Weight:  75.45 kg  BMI:  Body mass index is 24.36 kg/m.  Estimated Nutritional Needs:   Kcal:  1750-2000 calories  Protein:  90-113 grams (1.2-1.5g/kg)  Fluid:  1.75-2.0L  Dionne AnoWilliam M. Romilda Proby, MS, RD LDN Inpatient Clinical Dietitian Pager [phone]

## 2017-11-05 NOTE — Progress Notes (Addendum)
STROKE TEAM PROGRESS NOTE   SUBJECTIVE (INTERVAL HISTORY) No family is at bedside. Pt continues to have NG tube but TF's have been discontinued, for now.  As per nurse, patient ate 50% of breakfast this AM and Ensure put through tube. Continue calorie count and dietitian consult.    Overnight patient had sustained AFIB with RVR ranging 100-160, Hospitalists consulted for mangagement, IV Cardizem started, Cardiology consultation pending. This morning Heart rate and blood pressure better controlled.  WBC 13.4 today. Remains afebrile. U/A this AM negative. Will check CXR and continue to monitor closely. IVF briefly discontinued overnight due to B/P issues. Now restarted at 20cc/hr, as patient is not likely to be able to maintain proper hydration orally.  Results for orders placed or performed during the hospital encounter of 10/27/17 (from the past 24 hour(s))  Glucose, capillary     Status: Abnormal   Collection Time: 11/04/17 11:27 AM  Result Value Ref Range   Glucose-Capillary 144 (H) 65 - 99 mg/dL   Comment 1 Notify RN    Comment 2 Document in Chart   Glucose, capillary     Status: None   Collection Time: 11/04/17  4:24 PM  Result Value Ref Range   Glucose-Capillary 92 65 - 99 mg/dL   Comment 1 Notify RN   Glucose, capillary     Status: None   Collection Time: 11/04/17  8:17 PM  Result Value Ref Range   Glucose-Capillary 93 65 - 99 mg/dL  Glucose, capillary     Status: Abnormal   Collection Time: 11/05/17 12:10 AM  Result Value Ref Range   Glucose-Capillary 122 (H) 65 - 99 mg/dL   Comment 1 Notify RN   Urinalysis, Routine w reflex microscopic     Status: Abnormal   Collection Time: 11/05/17  3:31 AM  Result Value Ref Range   Color, Urine YELLOW YELLOW   APPearance CLEAR CLEAR   Specific Gravity, Urine 1.009 1.005 - 1.030   pH 8.0 5.0 - 8.0   Glucose, UA NEGATIVE NEGATIVE mg/dL   Hgb urine dipstick SMALL (A) NEGATIVE   Bilirubin Urine NEGATIVE NEGATIVE   Ketones, ur NEGATIVE  NEGATIVE mg/dL   Protein, ur 30 (A) NEGATIVE mg/dL   Nitrite NEGATIVE NEGATIVE   Leukocytes, UA NEGATIVE NEGATIVE   RBC / HPF 0-5 0 - 5 RBC/hpf   WBC, UA 0-5 0 - 5 WBC/hpf   Bacteria, UA RARE (A) NONE SEEN   Squamous Epithelial / LPF 0-5 (A) NONE SEEN   Amorphous Crystal PRESENT   Glucose, capillary     Status: Abnormal   Collection Time: 11/05/17  3:58 AM  Result Value Ref Range   Glucose-Capillary 118 (H) 65 - 99 mg/dL  CBC     Status: Abnormal   Collection Time: 11/05/17  6:16 AM  Result Value Ref Range   WBC 13.4 (H) 4.0 - 10.5 K/uL   RBC 4.72 4.22 - 5.81 MIL/uL   Hemoglobin 16.3 13.0 - 17.0 g/dL   HCT 16.1 09.6 - 04.5 %   MCV 100.4 (H) 78.0 - 100.0 fL   MCH 34.5 (H) 26.0 - 34.0 pg   MCHC 34.4 30.0 - 36.0 g/dL   RDW 40.9 81.1 - 91.4 %   Platelets 231 150 - 400 K/uL  Basic metabolic panel     Status: Abnormal   Collection Time: 11/05/17  6:16 AM  Result Value Ref Range   Sodium 140 135 - 145 mmol/L   Potassium 3.8 3.5 - 5.1 mmol/L  Chloride 106 101 - 111 mmol/L   CO2 23 22 - 32 mmol/L   Glucose, Bld 139 (H) 65 - 99 mg/dL   BUN 19 6 - 20 mg/dL   Creatinine, Ser 1.610.95 0.61 - 1.24 mg/dL   Calcium 9.0 8.9 - 09.610.3 mg/dL   GFR calc non Af Amer >60 >60 mL/min   GFR calc Af Amer >60 >60 mL/min   Anion gap 11 5 - 15   PHYSICAL EXAM Vitals:   11/05/17 0656 11/05/17 1014 11/05/17 1016 11/05/17 1028  BP:  (!) 153/89  130/75  Pulse: (!) 116  76   Resp:      Temp:      TempSrc:      SpO2:      Weight:      Height:       Pleasant frail  elderly Caucasian male currently not in distress. Afebrile. Head is nontraumatic. Neck is supple without bruit. Cardiac exam no murmur or gallop, persistent A. fib rhythm. Lungs are clear to auscultation.  Neurological Exam :  Awake alert, follows simple commands.  Lying in bed in NAD.  Severe dysarthria with expressive aphasia.  Left gaze improved. Blinks to threat on the left but not so on the right.  Follows commands well.  Right lower  facial weakness.  Tongue midline.  Motor system exam shows dense right hemiplegia with only trace withdrawal to painful stimuli in the right upper and lower extremity.  Purposeful antigravity strength on the left without weakness.  Diminished sensation on the right hemibody compared to the left.  Tone is diminished on the right compared to the left.  Right plantar upgoing with triple reflex and left downgoing.  Gait not tested.  IMAGING: I have personally reviewed the radiological images below and agree with the radiology interpretations.  CT HEAD WITHOUT CONTRAST 10/27/2017 10:53 IMPRESSION: 1. Acute hemorrhage within the left pons, relatively large at up to 20 mm diameter (estimated blood volume 2 mL). No extra-axial or intraventricular extension at this time. No significant intracranial mass effect.  No ventriculomegaly. 2. ASPECTS is not applicable, acute hemorrhage.  CT HEAD WITHOUT CONTRAST 10/27/2017 15:53 IMPRESSION: 1. Acute hemorrhage in the left pons is stable since 1045 hours today, blood volume estimated at 2 mL. 2. No new intracranial abnormality.  PORTABLE CHEST 1 VIEW 10/30/2017 00:17 IMPRESSION: Hypoventilatory chest with mild bibasilar atelectasis, which may be secondary to low lung volumes or seen with aspiration. No confluent airspace disease. Unchanged cardiomegaly  PORTABLE CHEST 1 VIEW 11/03/2017 08:03 IMPRESSION: No active disease.  PORTABLE CHEST 1 VIEW 03/011/2019 - PENDING  No results found.  ASSESSMENT/PLAN [name] is a 84 y.o. male with history of atrial fibrillation on Coumadin, hypertension and hyperlipidemia, presenting with acute onset right-sided weakness, facial droop, dysarthria, and left gaze preference. 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 Etiology indeterminate underlying cavernoma versus hypertensive plus warfarin related coagulopathy  Resultant dysarthria right  facial droop and right hemiplegia  CT head - acute hemorrhage within the left pons  LDL - 91  HgbA1c - 5.8  VTE prophylaxis - SCDs Fall precautions DIET - DYS 1 Room service appropriate? Yes; Fluid consistency: Nectar Thick.  Stopped  tube feedings  warfarin daily prior to admission, now on No antithrombotic  Ongoing aggressive stroke risk factor management  Therapy recommendations:  SNF  Disposition:  SNF,  Leukocytosis - WBC 13.4  Remains afebrile  Will repeat CXR today   U/A this AM negative   Will continue to monitor closely  Atrial Fib w/ RVR  Intermittent RVR, AFlutter  Increased Metoprolol to 50 mg BID, 11/02/2017  IV Cardizem started overnight due Aflutter and elevated HR  Cardiology consultation pending  Continue to monitor closely, PRN IV Metoprolol as needed  Hypertension Home meds of Cardizem and metoprolol Long-term BP goal normotensive  Continue to monitor closely, PRN IV Metoprolol as needed  Hyperlipidemia  Home meds: Lipitor 20 mg daily prior to admission not resumed in hospital  LDL 91, goal < 70  Resume statin at discharge  Dysphagia  SLP following   Still has Cortrak - may consider discontinuing in AM  Able to eat 50% of meal, continue to monitor closely  Stopped tube feeding for now  Calorie count  Dietitian consult  Feeding assistance with all meals. Ensure TID/ Pro-stat feeding supplement  Other Stroke Risk Factors  Advanced age  Hospital day # 9  VTE prophylaxis: SCD's  Diet : Fall precautions DIET - DYS 1 Room service appropriate? Yes; Fluid consistency: Nectar Thick   FAMILY UPDATES: No family at bedside  STATUS:  DNR    Prior Home Stroke Medications:  warfarin daily  Discharge Stroke Meds:  Please discharge patient on No antithrombotic due to ICH   Disposition:  Therapy Recs:               SNF, [employer], insurance pending Home Equipment:         PENDING Follow Up:  Follow-up Information     Micki Riley, MD. Schedule an appointment as soon as possible for a visit in 6 week(s).   Specialties:  Neurology, Radiology Contact information: [address] [place] [address] [phone]          Patient, No Pcp Per -PCP Follow up in 1-2 weeks    Case Management aware of need    Brita Romp Stroke Neurology Team 11/05/2017 11:20 AM  ATTENDING NOTE: I reviewed above note and agree with the assessment and plan. I have made any additions or clarifications directly to the above note. Pt was seen and examined.   Patient continue have A. fib RVR last night, cardiology and hospitalist consulted, put on Cardizem drip.  Heart rate around stable at 70s, patient has no complaints, not in acute distress.  Still has expressive aphasia with severe dysarthria, right facial droop in the right hemiparesis.  Mild leukocytosis but CXR unremarkable.  We will continue Cardizem the day as per cardiology recommendations.  Tube feeding stopped yesterday to facilitate p.o. intake.  Dietitian on board for calorie count.  Marvel Plan, MD PhD Stroke Neurology 11/05/2017 5:35 PM     To contact Stroke Continuity provider, please refer to [email or web address]. After hours, contact General Neurology

## 2017-11-05 NOTE — Progress Notes (Signed)
Cardizem maxed out, not having desired effect (essentially having no effect).  HR remains 150 in 2:1 flutter, BP remains high 160s systolic.  Have contacted cardiology and they will consult.  Asked if I should order anything now, they said no.  It sounds like they want to try and work more with the beta-blockers and CCBs since we cant put him on anticoagulation to do rhythm control agents.

## 2017-11-05 NOTE — Telephone Encounter (Signed)
New Message   Patients wife is calling to advise that [name] is in the hospital due to a stroke. She just wanted to inform Dr. Katrinka BlazingSmith.

## 2017-11-05 NOTE — Progress Notes (Signed)
RN called for pt with a HR 140-150's x3 hours. Dr. Wilford CornerArora was paged and new orders completed by RN. I was on another call, I suggested they get an EKG and then page neuro for a medical/cardiology consult. Pt is taking Cardizem PO for history of A-fib/A-flutter.  Dr. Julian ReilGardner to bedside to evaluate pt. Cardizem bolus given and gtt started.  Will continue to monitor pt. Advised RN to call for any assistance.

## 2017-11-05 NOTE — Consult Note (Signed)
Cardiology Consultation:   Patient ID: [name]; [id number]; [birth year]   Admit date: 10/27/2017 Date of Consult: 11/05/2017  Primary Care Provider: Patient, No Pcp Per Primary Cardiologist: Dr. Katrinka Blazing Primary Electrophysiologist:  Dr. Ladona Ridgel   Patient Profile:   [name] is a 84 y.o. male with a PMH of chronic atrial fibrillation/flutter on coumadin, HTN, HLD who was admitted 3/2 with a large L pons ICH, who is being seen today for the evaluation of atrial fibrillation with RVR at the request of Dr. Roda Shutters.  History of Present Illness:   [name] was in his usual state of health until 10/27/17 when he was found down outside of his barn. He was noted to have acute onset right-sided weakness, facial droop, dysarthia, and left gaze preference at the time of EMS arrival. He has been on coumadin for a number of years for atrial fibrillation. CT Head with large L pons ICH. INR on admission was 2.33, he was given Vitamin K and Kcentra, and admitted to neurology for further management of acute hemorrhagic stroke.   From a cardiac standpoint he had been doing well over the past year. He last saw Dr. Katrinka Blazing 10/2016 and was without complaints. EKG at that visit with sinus bradycardia and 1st degree AV block, which was new for him as he normally presents with atrial fibrillation or supraventricular atrial tachycardia. Also with elevated BP which he stated was normally SBP <110 at home. He was recommended to continue metoprolol and diltiazem for rate control and to monitor his BP closely at home. He follows closely at the Afib clinic for INR checks.   At the time of this interview, patient is still with significant dysarthria limiting history. He denies chest pain, SOB, or palpitations.   Hospital course: On admission patient was hypertensive to 161/106, HR 122, afebrile, satting well on RA. EKG with A flutter with 2:1 AV block, rate 117. CT head with large L pons ICH. Anticoagulation was  reversed. He had an NG tube placed for feeding given severe deficits. Overnight 3/10-3/11 patient with atrial flutter with 2:1 AV block, rate up to 160. IM consulted and patient was started on a diltiazem gtt in addition to po metoprolol. Given lack of HR response on diltiazem gtt, cardiology consulted for further assistance. Patient is planned for transition to SNF given significant functional decline s/p stroke.   Past Medical History:  Diagnosis Date  . Atrial fibrillation (HCC)   . Atrial flutter (HCC)   . Atrial flutter (HCC)   . Edema 03/17/2009   Qualifier: Diagnosis of  By: Flonnie Overman    . GERD (gastroesophageal reflux disease)   . HTN (hypertension)   . Hypercholesteremia   . Hyperlipidemia     History reviewed. No pertinent surgical history.   Home Medications:  Prior to Admission medications   Medication Sig Start Date End Date Taking? Authorizing Provider  atorvastatin (LIPITOR) 20 MG tablet Take 1 tablet (20 mg total) by mouth daily. 12/13/16  Yes Lyn Records, MD  Calcium Carbonate-Vitamin D (CALCIUM 600+D3 PO) Take by mouth. TAKE ONE TABLET DAILY   Yes [provider]  cholecalciferol (VITAMIN D) 1000 UNITS tablet Take 1,000 Units by mouth daily.   Yes [provider]  metoprolol succinate (TOPROL-XL) 100 MG 24 hr tablet TAKE ONE TABLET BY MOUTH ONCE DAILY WITH OR IMMEDIATELY FOLLOWING A MEAL 12/13/16  Yes Lyn Records, MD  Multiple Vitamins-Minerals (MULTIVITAMIN WITH MINERALS) tablet Take 1 tablet by mouth daily.  Yes [provider]  Omega-3 Fatty Acids (FISH OIL) 1000 MG CAPS Take by mouth. Take one daily   Yes [provider]  warfarin (COUMADIN) 5 MG tablet TAKE BY MOUTH AS DIRECTED BY COUMADIN CLINIC 07/16/17  Yes Lyn Records, MD  diltiazem (CARDIZEM CD) 120 MG 24 hr capsule Take 1 capsule (120 mg total) by mouth daily. 12/20/16 03/20/17  Lyn Records, MD  losartan-hydrochlorothiazide (HYZAAR) 100-12.5 MG tablet Take 1  tablet by mouth daily. Patient not taking: Reported on 10/27/2017 05/23/16   Lyn Records, MD    Inpatient Medications: Scheduled Meds: . sennosides  5 mL Per Tube BID   And  . docusate  50 mg Per Tube BID  . feeding supplement (ENSURE ENLIVE)  237 mL Oral TID BM  . feeding supplement (PRO-STAT SUGAR FREE 64)  30 mL Per Tube Daily  . metoprolol tartrate  50 mg Per Tube BID  . pantoprazole  40 mg Oral QHS  . QUEtiapine  12.5 mg Oral BID  . RESOURCE THICKENUP CLEAR   Oral TID PC & HS   Continuous Infusions: . sodium chloride 20 mL/hr at 11/05/17 1154  . diltiazem (CARDIZEM) infusion 10 mg/hr (11/05/17 1154)   PRN Meds: acetaminophen **OR** acetaminophen (TYLENOL) oral liquid 160 mg/5 mL **OR** acetaminophen, labetalol, metoprolol tartrate, RESOURCE THICKENUP CLEAR  Allergies:   No Known Allergies  Social History:   Social History   Socioeconomic History  . Marital status: Married    Spouse name: Not on file  . Number of children: Not on file  . Years of education: Not on file  . Highest education level: Not on file  Social Needs  . Financial resource strain: Not on file  . Food insecurity - worry: Not on file  . Food insecurity - inability: Not on file  . Transportation needs - medical: Not on file  . Transportation needs - non-medical: Not on file  Occupational History  . Not on file  Tobacco Use  . Smoking status: Never Smoker  . Smokeless tobacco: Never Used  Substance and Sexual Activity  . Alcohol use: No    Alcohol/week: 0.0 oz  . Drug use: No  . Sexual activity: Yes  Other Topics Concern  . Not on file  Social History Narrative  . Not on file    Family History:    Family History  Problem Relation Age of Onset  . Healthy Mother   . Other Father        "too much iron in blood"  . Healthy Sister      ROS:  Please see the history of present illness.   All other ROS reviewed and negative.     Physical Exam/Data:   Vitals:   11/05/17 1016  11/05/17 1028 11/05/17 1156 11/05/17 1211  BP:  130/75  (!) 149/67  Pulse: 76   69  Resp:    18  Temp:   (!) 97.5 F (36.4 C) (!) 97.5 F (36.4 C)  TempSrc:   Oral Oral  SpO2:    98%  Weight:      Height:        Intake/Output Summary (Last 24 hours) at 11/05/2017 1239 Last data filed at 11/05/2017 1100 Gross per 24 hour  Intake 1645.67 ml  Output 2850 ml  Net -1204.33 ml   Filed Weights   11/03/17 0143 11/04/17 0400 11/05/17 0426  Weight: 166 lb 3.6 oz (75.4 kg) 170 lb 10.2 oz (77.4 kg) 169 lb  12.1 oz (77 kg)   Body mass index is 24.36 kg/m.  General:  Ill appearing elderly gentleman laying in be in no acute distress. Somnolent. Following simple commands HEENT: sclera anicteric  Neck: no JVD Vascular: No carotid bruits; distal pulses 2+ bilaterally Cardiac:  normal S1, S2; IRRR; no murmurs/gallops/rubs Lungs:  clear to auscultation bilaterally, no wheezing, rhonchi or rales  Abd: NABS, soft, nontender, no hepatomegaly Ext: RUE with edema Musculoskeletal:  No deformities; Right sided hemiplegia Skin: warm and dry  Neuro:  Right sided facial droop; right sided hemiplegia, dysarthia Psych:  Follows simple commands  EKG:  The EKG was personally reviewed and demonstrates:  A flutter with 2:1 AV block, rate 144 Telemetry:  Telemetry was personally reviewed and demonstrates:  Atrial fibrillation and atrial flutter. Aflutter overnight with rate up to 160. Now with A flutter, rate in the 70s.   Relevant CV Studies: None  Laboratory Data:  Chemistry Recent Labs  Lab 11/03/17 0724 11/04/17 0459 11/05/17 0616  NA 140 141 140  K 3.7 3.5 3.8  CL 108 108 106  CO2 22 24 23   GLUCOSE 149* 128* 139*  BUN 28* 24* 19  CREATININE 1.07 0.92 0.95  CALCIUM 8.5* 8.5* 9.0  GFRNONAA >60 >60 >60  GFRAA >60 >60 >60  ANIONGAP 10 9 11     No results for input(s): PROT, ALBUMIN, AST, ALT, ALKPHOS, BILITOT in the last 168 hours. Hematology Recent Labs  Lab 11/03/17 0724 11/04/17 0459  11/05/17 0616  WBC 11.9* 9.7 13.4*  RBC 4.05* 4.18* 4.72  HGB 13.8 14.2 16.3  HCT 41.3 42.3 47.4  MCV 102.0* 101.2* 100.4*  MCH 34.1* 34.0 34.5*  MCHC 33.4 33.6 34.4  RDW 13.2 13.4 13.0  PLT 165 192 231   Cardiac EnzymesNo results for input(s): TROPONINI in the last 168 hours. No results for input(s): TROPIPOC in the last 168 hours.  BNPNo results for input(s): BNP, PROBNP in the last 168 hours.  DDimer No results for input(s): DDIMER in the last 168 hours.  Radiology/Studies:  Dg Chest Port 1 View  Result Date: 11/03/2017 CLINICAL DATA:  Congestive heart failure. EXAM: PORTABLE CHEST 1 VIEW COMPARISON:  October 29, 2017 FINDINGS: Cardiomegaly. The hila and mediastinum are unchanged. No pneumothorax. A feeding tube terminates below today's film. The lungs are clear. No other acute abnormalities. IMPRESSION: No active disease. Electronically Signed   By: Gerome Sam III M.D   On: 11/03/2017 08:03    Assessment and Plan:   1. Atrial fibrillation/ flutter: patient has a long history of atrial fibrillation/ flutter. Was on po metoprolol and diltiazem prior to this hospitalization. His diltiazem and metoprolol were uptitrated over the past several days given episodes of Afib/flutter with RVR. Currently on a diltiazem gtt (rate 7.5/hr) and po metoprolol 50mg  BID with a good response. HR currently in the 70s, BP also much improved to 126/69 - Anticoagulation on hold indefinitely given recent hemorrhagic stroke.  - Would continue diltiazem gtt for now given good response - can likely convert to po tomorrow if HR stable overnight - Continue metoprolol 50mg  BID  2. HTN: BP consistently elevated. Improved this AM on diltiazem gtt and po metoprolol - Continue diltiazem gtt with plans to convert to po tomorrow if HR stable - Continue metoprolol 50mg  BID  3. HLD: LDL 91, goal <70 - Will plan to restart atorvastatin 40mg  today via NG tube  4. Acute ICH: Patient presented with  right-sided  weakness, facial droop, dysarthia, and left gaze  preference. CT head with acute L pons ICH. Unclear etiology but suspected cavernoma vs HTN + coumadin related coagulopathy. Now s/p NG tube placement for medication and nutritional support.  - Continue management per Neurology - Plans for discharge to SNF once medically ready   For questions or updates, please contact [facility] Please consult [email or web address] for contact info under Cardiology/STEMI.   Signed, Beatriz StallionKrista M. Dessire Grimes, PA-C  11/05/2017 12:39 PM [phone]

## 2017-11-05 NOTE — Progress Notes (Signed)
Notified provider about Pt's BP and HR, he ordered  1 time 2.5mg  metoPROLOL

## 2017-11-05 NOTE — Care Management Note (Signed)
Case Management Note  Patient Details  Name: [name] MRN: [id number] Date of Birth: 05/12/1930  Subjective/Objective:                    Action/Plan: Pt started on Cardizem gtt for HR/rhythm today. Plan is for SNF when medically stable. CM following.   Expected Discharge Date:                  Expected Discharge Plan:  Skilled Nursing Facility  In-House Referral:  Clinical Social Work  Discharge planning Services     Post Acute Care Choice:    Choice offered to:     DME Arranged:    DME Agency:     HH Arranged:    HH Agency:     Status of Service:  Completed, signed off  If discussed at [employer] Meetings, dates discussed:    Additional Comments:  Kermit BaloKelli F Galit Urich, RN 11/05/2017, 11:41 AM

## 2017-11-05 NOTE — Progress Notes (Signed)
Please hang calorie count envelope on the patient's door. Document percent consumed for each item on the patient's meal tray ticket and keep in envelope. Also document percent of any supplement or snack pt consumes and keep documentation in envelope for RD to review.   Dionne AnoWilliam M. Akemi Overholser, MS, RD LDN Inpatient Clinical Dietitian Pager [phone]

## 2017-11-05 NOTE — Telephone Encounter (Signed)
Will route to Dr. Smith to make him aware. 

## 2017-11-06 DIAGNOSIS — R2689 Other abnormalities of gait and mobility: Secondary | ICD-10-CM | POA: Diagnosis not present

## 2017-11-06 DIAGNOSIS — T17908A Unspecified foreign body in respiratory tract, part unspecified causing other injury, initial encounter: Secondary | ICD-10-CM

## 2017-11-06 DIAGNOSIS — I5032 Chronic diastolic (congestive) heart failure: Secondary | ICD-10-CM

## 2017-11-06 DIAGNOSIS — R1310 Dysphagia, unspecified: Secondary | ICD-10-CM

## 2017-11-06 DIAGNOSIS — D72829 Elevated white blood cell count, unspecified: Secondary | ICD-10-CM

## 2017-11-06 DIAGNOSIS — I69251 Hemiplegia and hemiparesis following other nontraumatic intracranial hemorrhage affecting right dominant side: Secondary | ICD-10-CM | POA: Diagnosis not present

## 2017-11-06 DIAGNOSIS — I509 Heart failure, unspecified: Secondary | ICD-10-CM

## 2017-11-06 DIAGNOSIS — I50812 Chronic right heart failure: Secondary | ICD-10-CM

## 2017-11-06 DIAGNOSIS — I69322 Dysarthria following cerebral infarction: Secondary | ICD-10-CM | POA: Diagnosis not present

## 2017-11-06 LAB — GLUCOSE, CAPILLARY
GLUCOSE-CAPILLARY: 105 mg/dL — AB (ref 65–99)
Glucose-Capillary: 150 mg/dL — ABNORMAL HIGH (ref 65–99)

## 2017-11-06 LAB — BASIC METABOLIC PANEL
Anion gap: 9 (ref 5–15)
BUN: 24 mg/dL — AB (ref 6–20)
CO2: 22 mmol/L (ref 22–32)
CREATININE: 1.05 mg/dL (ref 0.61–1.24)
Calcium: 8.8 mg/dL — ABNORMAL LOW (ref 8.9–10.3)
Chloride: 109 mmol/L (ref 101–111)
Glucose, Bld: 123 mg/dL — ABNORMAL HIGH (ref 65–99)
Potassium: 3.8 mmol/L (ref 3.5–5.1)
SODIUM: 140 mmol/L (ref 135–145)

## 2017-11-06 LAB — CBC
HCT: 43 % (ref 39.0–52.0)
Hemoglobin: 15.1 g/dL (ref 13.0–17.0)
MCH: 35 pg — AB (ref 26.0–34.0)
MCHC: 35.1 g/dL (ref 30.0–36.0)
MCV: 99.8 fL (ref 78.0–100.0)
PLATELETS: 221 10*3/uL (ref 150–400)
RBC: 4.31 MIL/uL (ref 4.22–5.81)
RDW: 12.9 % (ref 11.5–15.5)
WBC: 11.7 10*3/uL — ABNORMAL HIGH (ref 4.0–10.5)

## 2017-11-06 MED ORDER — METOPROLOL TARTRATE 5 MG/5ML IV SOLN
2.5000 mg | INTRAVENOUS | Status: DC | PRN
  Administered 2017-11-06: 5 mg via INTRAVENOUS
  Filled 2017-11-06: qty 5

## 2017-11-06 MED ORDER — DILTIAZEM HCL 60 MG PO TABS
60.0000 mg | ORAL_TABLET | Freq: Four times a day (QID) | ORAL | Status: DC
  Administered 2017-11-06 – 2017-11-07 (×4): 60 mg via ORAL
  Filled 2017-11-06 (×7): qty 1

## 2017-11-06 MED ORDER — DILTIAZEM HCL ER 60 MG PO CP12
60.0000 mg | ORAL_CAPSULE | Freq: Two times a day (BID) | ORAL | Status: DC
  Administered 2017-11-06: 60 mg via ORAL
  Filled 2017-11-06 (×2): qty 1

## 2017-11-06 NOTE — Progress Notes (Signed)
Progress Note  Patient Name: FRANCISCOJAVIER WRONSKI Date of Encounter: 11/06/2017  Primary Cardiologist: No primary care provider on file.   Subjective   Patient denies any chest pain or shortness of breath.  Neuro is at the bedside.  They are going to try to get NG tube out and start eating.  Inpatient Medications    Scheduled Meds: . atorvastatin  40 mg Per NG tube q1800  . diltiazem  60 mg Oral Q12H  . sennosides  5 mL Per Tube BID   And  . docusate  50 mg Per Tube BID  . feeding supplement (ENSURE ENLIVE)  237 mL Oral TID BM  . feeding supplement (PRO-STAT SUGAR FREE 64)  30 mL Per Tube Daily  . metoprolol tartrate  50 mg Per Tube BID  . pantoprazole  40 mg Oral QHS  . QUEtiapine  12.5 mg Oral BID  . RESOURCE THICKENUP CLEAR   Oral TID PC & HS   Continuous Infusions: . sodium chloride 20 mL/hr at 11/05/17 1154   PRN Meds: acetaminophen **OR** acetaminophen (TYLENOL) oral liquid 160 mg/5 mL **OR** acetaminophen, labetalol, metoprolol tartrate, RESOURCE THICKENUP CLEAR   Vital Signs    Vitals:   11/06/17 0431 11/06/17 0746 11/06/17 1146 11/06/17 1239  BP:  137/71 130/80 132/79  Pulse:  78 82 80  Resp:  (!) 23 19   Temp: 98.7 F (37.1 C) 97.9 F (36.6 C) 97.6 F (36.4 C)   TempSrc: Oral Axillary Axillary   SpO2:  95% 96%   Weight:      Height:        Intake/Output Summary (Last 24 hours) at 11/06/2017 1420 Last data filed at 11/06/2017 0433 Gross per 24 hour  Intake -  Output 600 ml  Net -600 ml   Filed Weights   11/03/17 0143 11/04/17 0400 11/05/17 0426  Weight: 166 lb 3.6 oz (75.4 kg) 170 lb 10.2 oz (77.4 kg) 169 lb 12.1 oz (77 kg)    Telemetry    Atrial flutter- Personally Reviewed  ECG    No new EKG to review.- Personally Reviewed  Physical Exam   GEN: No acute distress.   Neck: No JVD Cardiac: RRR, no murmurs, rubs, or gallops.  Respiratory: Clear to auscultation bilaterally. GI: Soft, nontender, non-distended  MS: No edema; No  deformity. Neuro:  Nonfocal  Psych: Normal affect   Labs    Chemistry Recent Labs  Lab 11/04/17 0459 11/05/17 0616 11/06/17 0356  NA 141 140 140  K 3.5 3.8 3.8  CL 108 106 109  CO2 24 23 22   GLUCOSE 128* 139* 123*  BUN 24* 19 24*  CREATININE 0.92 0.95 1.05  CALCIUM 8.5* 9.0 8.8*  GFRNONAA >60 >60 >60  GFRAA >60 >60 >60  ANIONGAP 9 11 9      Hematology Recent Labs  Lab 11/04/17 0459 11/05/17 0616 11/06/17 0356  WBC 9.7 13.4* 11.7*  RBC 4.18* 4.72 4.31  HGB 14.2 16.3 15.1  HCT 42.3 47.4 43.0  MCV 101.2* 100.4* 99.8  MCH 34.0 34.5* 35.0*  MCHC 33.6 34.4 35.1  RDW 13.4 13.0 12.9  PLT 192 231 221    Cardiac EnzymesNo results for input(s): TROPONINI in the last 168 hours. No results for input(s): TROPIPOC in the last 168 hours.   BNPNo results for input(s): BNP, PROBNP in the last 168 hours.   DDimer No results for input(s): DDIMER in the last 168 hours.   Radiology    Dg Chest [facility]  Result Date: 11/05/2017 CLINICAL DATA:  Shortness of breath EXAM: PORTABLE CHEST 1 VIEW COMPARISON:  Chest x-rays dated 11/03/2017 and 10/29/2017. FINDINGS: Stable cardiomegaly. Overall cardiomediastinal silhouette is stable. Enteric tube passes below the diaphragm. Lungs are clear. No pleural effusion or pneumothorax seen. No acute or suspicious osseous finding. IMPRESSION: No active disease. No evidence of pneumonia or pulmonary edema. Stable cardiomegaly. Electronically Signed   By: Bary RichardStan  Maynard M.D.   On: 11/05/2017 13:01    Cardiac Studies  2D echocardiogram is pending.  Patient Profile     84 y.o. male history of chronic atrial fibrillation/flutter on chronic anticoagulation with coumadin, HTN and hyperlipidemia.  Unfortunately he suffered a large left Pons ICH with significant facial droop, dysarthria.  His anticoagulation was reversed with Kcentra and Vit K.  He now has an NGT for feeding. Went into atrial flutter with 2:1 block up to 160bpm and started on IV Cardizem  gtt along with his PO metoprolol.  He is now back rate controlled.  Plan it for transition to SNF.    Assessment & Plan    1. Atrial fibrillation/ flutter: patient has a long history of atrial fibrillation/ flutter. Was on po metoprolol and diltiazem prior to this hospitalization. His diltiazem and metoprolol were uptitrated over the past several days given episodes of Afib/flutter with RVR.  -Currently not a candidate for anticoagulation long-term due to large hemorrhagic stroke.  - Heart rate well controlled so we will change IV Cardizem to 60 mg p.o. Cardizem per NG tube every 6 hours - Continue metoprolol 50mg  BID -2D echocardiogram pending   2. HTN:  -Blood pressure well controlled on exam.  Will defer parameters to neuro. -Changing to p.o. diltiazem 60 mg every 6 hours today. -Continue metoprolol 50mg  BID  3. HLD: LDL 91, goal <70 - Statin restarted per NG tube.  4. Acute ICH: Patient presented with  right-sided weakness, facial droop, dysarthia, and left gaze preference. CT head with acute L pons ICH. Unclear etiology but suspected cavernoma vs HTN + coumadin related coagulopathy. Now s/p NG tube placement for medication and nutritional support.  - Continue management per Neurology - Plans for discharge to SNF once medically ready    For questions or updates, please contact [facility] Please consult [email or web address] for contact info under Cardiology/STEMI.      Signed, Armanda Magicraci Turner, MD  11/06/2017, 2:20 PM

## 2017-11-06 NOTE — Progress Notes (Addendum)
STROKE TEAM PROGRESS NOTE   SUBJECTIVE (INTERVAL HISTORY) No family is at bedside. Cortrak tube in place but TF's have been discontinued. Patient continues to need full assist for feeding but is willing to eat a majority of his meal tray. Ensure and Protein supplements TID. Continue calorie count and dietitian consult.    RN reports Intermittent AFIB with RVR ranging 100-160 overnight but improved from the night before. Hospitalists and Cardiology following. IV Cardizem to be transitioned to PO today. This morning Heart rate and blood pressure better controlled.  WBC trending down, 11.7 today. Remains afebrile. U/A and CXR - negative.   Results for orders placed or performed during the hospital encounter of 10/27/17 (from the past 24 hour(s))  Glucose, capillary     Status: Abnormal   Collection Time: 11/05/17  8:21 PM  Result Value Ref Range   Glucose-Capillary 135 (H) 65 - 99 mg/dL   Comment 1 Notify RN    Comment 2 Document in Chart   CBC     Status: Abnormal   Collection Time: 11/06/17  3:56 AM  Result Value Ref Range   WBC 11.7 (H) 4.0 - 10.5 K/uL   RBC 4.31 4.22 - 5.81 MIL/uL   Hemoglobin 15.1 13.0 - 17.0 g/dL   HCT 16.1 09.6 - 04.5 %   MCV 99.8 78.0 - 100.0 fL   MCH 35.0 (H) 26.0 - 34.0 pg   MCHC 35.1 30.0 - 36.0 g/dL   RDW 40.9 81.1 - 91.4 %   Platelets 221 150 - 400 K/uL  Basic metabolic panel     Status: Abnormal   Collection Time: 11/06/17  3:56 AM  Result Value Ref Range   Sodium 140 135 - 145 mmol/L   Potassium 3.8 3.5 - 5.1 mmol/L   Chloride 109 101 - 111 mmol/L   CO2 22 22 - 32 mmol/L   Glucose, Bld 123 (H) 65 - 99 mg/dL   BUN 24 (H) 6 - 20 mg/dL   Creatinine, Ser 7.82 0.61 - 1.24 mg/dL   Calcium 8.8 (L) 8.9 - 10.3 mg/dL   GFR calc non Af Amer >60 >60 mL/min   GFR calc Af Amer >60 >60 mL/min   Anion gap 9 5 - 15   PHYSICAL EXAM Vitals:   11/06/17 0431 11/06/17 0746 11/06/17 1146 11/06/17 1239  BP:  137/71 130/80 132/79  Pulse:  78 82 80  Resp:  (!) 23 19    Temp: 98.7 F (37.1 C) 97.9 F (36.6 C) 97.6 F (36.4 C)   TempSrc: Oral Axillary Axillary   SpO2:  95% 96%   Weight:      Height:       Pleasant frail  elderly Caucasian male currently not in distress. Afebrile. Head is nontraumatic. Neck is supple without bruit. Cardiac exam no murmur or gallop, persistent A. fib rhythm. Lungs are clear to auscultation.  Neurological Exam :  Awake alert, follows simple commands.  Lying in bed in NAD.  Severe dysarthria with expressive aphasia.  Left gaze improved. Blinks to threat on the left but not so on the right.  Follows commands well.  Right lower facial weakness.  Tongue midline.  Motor system exam shows dense right hemiplegia with only trace withdrawal to painful stimuli in the right upper and lower extremity.  Purposeful antigravity strength on the left without weakness.  Diminished sensation on the right hemibody compared to the left.  Tone is diminished on the right compared to the left.  Right plantar  upgoing with triple reflex and left downgoing.  Gait not tested.  IMAGING: I have personally reviewed the radiological images below and agree with the radiology interpretations.  CT HEAD WITHOUT CONTRAST 10/27/2017 10:53 IMPRESSION: 1. Acute hemorrhage within the left pons, relatively large at up to 20 mm diameter (estimated blood volume 2 mL). No extra-axial or intraventricular extension at this time. No significant intracranial mass effect.  No ventriculomegaly. 2. ASPECTS is not applicable, acute hemorrhage.  CT HEAD WITHOUT CONTRAST 10/27/2017 15:53 IMPRESSION: 1. Acute hemorrhage in the left pons is stable since 1045 hours today, blood volume estimated at 2 mL. 2. No new intracranial abnormality.  PORTABLE CHEST 1 VIEW 10/30/2017 00:17 IMPRESSION: Hypoventilatory chest with mild bibasilar atelectasis, which may be secondary to low lung volumes or seen with aspiration. No confluent airspace disease. Unchanged  cardiomegaly  PORTABLE CHEST 1 VIEW 11/03/2017 08:03 IMPRESSION: No active disease.  Dg Chest Port 1 View  Result Date: 11/05/2017 CLINICAL DATA:  Shortness of breath EXAM: PORTABLE CHEST 1 VIEW COMPARISON:  Chest x-rays dated 11/03/2017 and 10/29/2017. FINDINGS: Stable cardiomegaly. Overall cardiomediastinal silhouette is stable. Enteric tube passes below the diaphragm. Lungs are clear. No pleural effusion or pneumothorax seen. No acute or suspicious osseous finding. IMPRESSION: No active disease. No evidence of pneumonia or pulmonary edema. Stable cardiomegaly. Electronically Signed   By: Bary RichardStan  Maynard M.D.   On: 11/05/2017 13:01   ASSESSMENT/PLAN [name] is a 84 y.o. male with history of atrial fibrillation on Coumadin, hypertension and hyperlipidemia, presenting with acute onset right-sided weakness, facial droop, dysarthria, and left gaze preference. 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 Etiology indeterminate underlying cavernoma versus hypertensive plus warfarin related coagulopathy  Resultant dysarthria right facial droop and right hemiplegia  CT head - acute hemorrhage within the left pons  LDL - 91  HgbA1c - 5.8  VTE prophylaxis - SCDs Fall precautions DIET - DYS 1 Room service appropriate? Yes; Fluid consistency: Nectar Thick.  Stopped  tube feedings  warfarin daily prior to admission, now on No antithrombotic  Ongoing aggressive stroke risk factor management  Therapy recommendations:  SNF  Disposition:  SNF, in AM  Leukocytosis - WBC 11.7   Remains afebrile   CXR 3/11  - no acute findings  U/A 3/11 - negative   Will continue to monitor closely  Atrial Fib w/ RVR  Intermittent RVR, AFlutter  Increased Metoprolol to 50 mg BID, 11/02/2017  IV Cardizem was started for Aflutter and elevated HR, now discontinued and PO Cardizem resumed at divided dose  Cardiology consultation -  Appreciate Recs  Continue to monitor closely, PRN IV Metoprolol as needed  Hypertension Home meds of Cardizem. Losartan/HCTZ and metoprolol Long-term BP goal normotensive  Continue to monitor closely, PRN IV Metoprolol as needed  Resume Losartan if  Better B/P control needed  Hyperlipidemia  Home meds: Lipitor 20 mg daily prior to admission not resumed in hospital  LDL 91, goal < 70  Resume statin at discharge  Dysphagia  SLP following   Still has Cortrak - Will discontinuing in AM  Able to eat 50% of meal, continue to monitor closely  Stopped tube feeding for now  Calorie count  Dietitian consult  Feeding assistance with all meals. Ensure TID/ Pro-stat feeding supplement  Other Stroke Risk Factors  Advanced age  Hospital day # 10  VTE prophylaxis: SCD's  Diet : Fall precautions DIET - DYS  1 Room service appropriate? Yes; Fluid consistency: Nectar Thick   FAMILY UPDATES: No family at bedside  STATUS:  DNR    Prior Home Stroke Medications:  warfarin daily  Discharge Stroke Meds:  Please discharge patient on No antithrombotic due to ICH   Disposition:  Therapy Recs:               SNF,  [facility], insurance approval granted today. Will plan for discharge in AM Equipment:         NONE Follow Up:  Follow-up Information    Micki Riley, MD. Schedule an appointment as soon as possible for a visit in 6 week(s).   Specialties:  Neurology, Radiology Contact information: [address] [place] [address] [phone]          Patient, No Pcp Per -PCP Follow up in 1-2 weeks    Case Management aware of need    Brita Romp Stroke Neurology Team 11/06/2017 1:40 PM  ATTENDING NOTE: I reviewed above note and agree with the assessment and plan. I have made any additions or clarifications directly to the above note. Pt was seen and examined.   Patient HR controlled with IV cardizem over night, now changed to po cardizem  60mg  Q6h. Today during round heart rate around stable at 80-100, patient has no complaints, not in acute distress. Leukocytosis improving. Still has expressive aphasia with severe dysarthria, right facial droop in the right hemiparesis.  Tube feeding stopped and discussed with family and pt to improve p.o. intake. Plan to d/c NG tube and transfer to SNF in am.   Marvel Plan, MD PhD Stroke Neurology 11/06/2017 1:40 PM  To contact Stroke Continuity provider, please refer to [email or web address]. After hours, contact General Neurology

## 2017-11-06 NOTE — Progress Notes (Signed)
Calorie Count Note  48 hour calorie count ordered.  Diet: Dysphagia 1 with nectar thick liquids Supplements: 30 ml Prostat daily (via tube), Ensure Enlive po TID, each supplement provides 350 kcal and 20 grams of protein  Calorie count initiated yesterday, per RD note. Spoke with pt and family members at bedside, who report that pt is a little more alert today. Pt able to respond to a few close ended questions; pt reports that he did not eat lunch today. Wife and family members confirm that pt continues to take only bites and sips at meals. Per SLP, also concerned that pt will be able to take adequate intake via PO route. Cortrak tube still in place at time of visit.   RD discussed with pt wife rationale for calorie count and holding tube feedings.   Breakfast: very minimal intake (bites and sips) Lunch: very minimal intake (bites and sips) Dinner: very minimal intake (bites and sips) Supplements: 1 Prostat daily (100 kcals, 15 grams protein)  Nutrition Dx: Inadequate oral intake related to dysphagia, lethargy as evidenced by meal completion <25%, pt/family report; ongoing  Goal: Patient will meet greater than or equal to 90% of their needs; unmet  Intervention:   -Follow-up with calorie count results on 11/07/17 -Continue Ensure Enlive po TID, each supplement provides 350 kcal and 20 grams of protein -Continue 30 ml Prostat daily, each supplement provides 100 kcals and 15 grams protein -Magic Cup TID with meals, each supplement provides 290 kcals and 9 grams protein -If PO intake remains inadequate, consider resumption of TF for nutrition support. Recommend:  Initiate Jevity 1.2 @ 20 ml/hr via cortrak tube and increase by 10 ml every 4 hours to goal rate of 60 ml/hr.   30 ml Prostat daily.    Tube feeding regimen provides 1828 kcal (100% of needs), 95 grams of protein, and 1162 ml of H2O.   Raphael Fitzpatrick A. Mayford KnifeWilliams, RD, LDN, CDE Pager: [phone] After hours Pager: [phone]

## 2017-11-06 NOTE — Consult Note (Signed)
Medical Consultation   [name]  [id number]  DOB: Apr 27, 1930  DOA: 10/27/2017  PCP: Patient, No Pcp Per   Outpatient Specialists: Dr. Katrinka BlazingSmith (Cards)   Requesting physician: Dr. Wilford CornerArora  Reason for consultation: A.Fib RVR   History of Present Illness: Alexander PatchCarlton R Roman is an 84 y.o. male with h/o A.Fib on coumadin, HTN, HLD.  Patient was admitted on 3/2 with Large L pons ICH.  Anticoagulation was reversed.  Due to severe deficits NG tube has been placed, he is currently awaiting SNF placement.  His stay has been complicated by intermittent RVR of his chronic a.fib.  This has been resistant to PO cardizem, PO scheduled metoprolol and IV PRN metoprolol.  Hospitalist consulted.  11/06/2017: Patient seen and examined at his bedside.  He is alert.  Difficult to understand due to expressive aphasia.  Does not appear to be in any acute distress.  Blood pressure and heart rate improved from yesterday.   Review of Systems:  ROS As per HPI otherwise 10 point review of systems negative.   Past Medical History: Past Medical History:  Diagnosis Date  . Atrial fibrillation (HCC)   . Atrial flutter (HCC)   . Atrial flutter (HCC)   . Edema 03/17/2009   Qualifier: Diagnosis of  By: Flonnie OvermanMcLean, Monica    . GERD (gastroesophageal reflux disease)   . HTN (hypertension)   . Hypercholesteremia   . Hyperlipidemia     Past Surgical History: History reviewed. No pertinent surgical history.   Allergies:  No Known Allergies   Social History:  reports that  has never smoked. he has never used smokeless tobacco. He reports that he does not drink alcohol or use drugs.   Family History: Family History  Problem Relation Age of Onset  . Healthy Mother   . Other Father        "too much iron in blood"  . Healthy Sister      Physical Exam: Vitals:   11/06/17 0431 11/06/17 0746 11/06/17 1146 11/06/17 1239  BP:  137/71 130/80 132/79  Pulse:  78 82 80  Resp:  (!) 23 19     Temp: 98.7 F (37.1 C) 97.9 F (36.6 C) 97.6 F (36.4 C)   TempSrc: Oral Axillary Axillary   SpO2:  95% 96%   Weight:      Height:        Constitutional: Alert and awake, follows simple commands Eyes: Pupils round and reactive to light.  No erythema or icterus noted  ENMT dry mucosa with no erythema or exudates. \ Neck: No JVD or thyromegaly CVS: Irregular rate and rhythm with no rubs or gallops  Respiratory: Clear to auscultation with no wheezes or rales Abdomen: Soft nontender nondistended normal bowel sounds x4 quadrant Musculoskeletal: Right upper and right lower extremity weakness: Neuro: Dysarthria with expressive aphasia.  Right upper and right lower extremity weakness.   Psych: Mood is appropriate for condition and setting.   Skin: Mild bruising at the puncture sites   Data reviewed:  I have personally reviewed following labs and imaging studies Labs:  CBC: Recent Labs  Lab 11/02/17 0338 11/03/17 0724 11/04/17 0459 11/05/17 0616 11/06/17 0356  WBC 12.5* 11.9* 9.7 13.4* 11.7*  HGB 15.4 13.8 14.2 16.3 15.1  HCT 44.9 41.3 42.3 47.4 43.0  MCV 100.2* 102.0* 101.2* 100.4* 99.8  PLT 202 165 192 231 221    Basic Metabolic Panel: Recent  Labs  Lab 10/31/17 [phone]  11/02/17 0338 11/03/17 0724 11/04/17 0459 11/05/17 0616 11/06/17 0356  NA  --   --  140 140 141 140 140  K  --    < > 3.7 3.7 3.5 3.8 3.8  CL  --   --  107 108 108 106 109  CO2  --   --  23 22 24 23 22   GLUCOSE  --   --  120* 149* 128* 139* 123*  BUN  --   --  22* 28* 24* 19 24*  CREATININE  --   --  0.96 1.07 0.92 0.95 1.05  CALCIUM  --   --  8.9 8.5* 8.5* 9.0 8.8*  MG 2.1  --  2.1  --   --   --   --   PHOS 2.6  --   --   --   --   --   --    < > = values in this interval not displayed.   GFR Estimated Creatinine Clearance: 53.1 mL/min (by C-G formula based on SCr of 1.05 mg/dL). Liver Function Tests: No results for input(s): AST, ALT, ALKPHOS, BILITOT, PROT, ALBUMIN in the last 168 hours. No  results for input(s): LIPASE, AMYLASE in the last 168 hours. No results for input(s): AMMONIA in the last 168 hours. Coagulation profile No results for input(s): INR, PROTIME in the last 168 hours.  Cardiac Enzymes: No results for input(s): CKTOTAL, CKMB, CKMBINDEX, TROPONINI in the last 168 hours. BNP: Invalid input(s): POCBNP CBG: Recent Labs  Lab 11/04/17 1624 11/04/17 2017 11/05/17 0010 11/05/17 0358 11/05/17 2021  GLUCAP 92 93 122* 118* 135*   D-Dimer No results for input(s): DDIMER in the last 72 hours. Hgb A1c No results for input(s): HGBA1C in the last 72 hours. Lipid Profile No results for input(s): CHOL, HDL, LDLCALC, TRIG, CHOLHDL, LDLDIRECT in the last 72 hours. Thyroid function studies No results for input(s): TSH, T4TOTAL, T3FREE, THYROIDAB in the last 72 hours.  Invalid input(s): FREET3 Anemia work up No results for input(s): VITAMINB12, FOLATE, FERRITIN, TIBC, IRON, RETICCTPCT in the last 72 hours. Urinalysis    Component Value Date/Time   COLORURINE YELLOW 11/05/2017 0331   APPEARANCEUR CLEAR 11/05/2017 0331   LABSPEC 1.009 11/05/2017 0331   PHURINE 8.0 11/05/2017 0331   GLUCOSEU NEGATIVE 11/05/2017 0331   HGBUR SMALL (A) 11/05/2017 0331   BILIRUBINUR NEGATIVE 11/05/2017 0331   KETONESUR NEGATIVE 11/05/2017 0331   PROTEINUR 30 (A) 11/05/2017 0331   NITRITE NEGATIVE 11/05/2017 0331   LEUKOCYTESUR NEGATIVE 11/05/2017 0331     Microbiology No results found for this or any previous visit (from the past 240 hour(s)).     Inpatient Medications:   Scheduled Meds: . atorvastatin  40 mg Per NG tube q1800  . sennosides  5 mL Per Tube BID   And  . docusate  50 mg Per Tube BID  . feeding supplement (ENSURE ENLIVE)  237 mL Oral TID BM  . feeding supplement (PRO-STAT SUGAR FREE 64)  30 mL Per Tube Daily  . metoprolol tartrate  50 mg Per Tube BID  . pantoprazole  40 mg Oral QHS  . QUEtiapine  12.5 mg Oral BID  . RESOURCE THICKENUP CLEAR   Oral TID PC  & HS   Continuous Infusions: . sodium chloride 20 mL/hr at 11/05/17 1154  . diltiazem (CARDIZEM) infusion 10 mg/hr (11/06/17 0107)     Radiological Exams on Admission: Dg Chest Port 1 View  Result Date:  11/05/2017 CLINICAL DATA:  Shortness of breath EXAM: PORTABLE CHEST 1 VIEW COMPARISON:  Chest x-rays dated 11/03/2017 and 10/29/2017. FINDINGS: Stable cardiomegaly. Overall cardiomediastinal silhouette is stable. Enteric tube passes below the diaphragm. Lungs are clear. No pleural effusion or pneumothorax seen. No acute or suspicious osseous finding. IMPRESSION: No active disease. No evidence of pneumonia or pulmonary edema. Stable cardiomegaly. Electronically Signed   By: Bary Richard M.D.   On: 11/05/2017 13:01    Impression/Recommendations Principal Problem:   ICH (intracerebral hemorrhage) (HCC) Active Problems:   Essential hypertension   Atrial flutter (HCC)   Atrial fibrillation with RVR (HCC)   Atrial flutter with rapid ventricular response (HCC)   Leukocytosis  A. fib/A flutter with RVR, now rate controlled Continue beta-blockade Cardizem drip as needed Lopressor IV as needed for heart rate greater than 120 Cardiology is following  Hypertension, now controlled Continue home medications On metoprolol expressive aphasia  Right hemiparesis/expressive aphasia post stroke Primary team following  History of intracranial hemorrhage No anticoagulation  Dysphagia secondary to stroke Improving Speech therapy following and recommended pured and nectar thick liquids  Depression/anxiety Continue Seroquel   Thank you for this consultation.  Our [facility] hospitalist team will follow the patient with you.     Darlin Drop D.O. [facility] 11/06/2017, 1:12 PM

## 2017-11-06 NOTE — Progress Notes (Signed)
  Speech Language Pathology Treatment: Dysphagia;Cognitive-Linquistic  Patient Details Name: Alexander PatchCarlton R Seawright MRN: [id number] DOB: [birth year] Today's Date: 11/06/2017 Time: [id number] SLP Time Calculation (min) (ACUTE ONLY): 34 min  Assessment / Plan / Recommendation Clinical Impression  Pt is making slow, limited gains.  Pt was awake and alert today but needed max assist multimodal cues for sequencing and initiation to feed himself with his nondominant hand.  He had delayed coughing with nectar thick liquids via teaspoon and purees due to what appeared to be a delay in swallow initiation and poor oral control of boluses.  Pt's vocal quality was wet both at baseline and with PO intake.  He was stimulable for volitional throat clear to correct vocal quality; however, wetness was quick to return due to accumulation of residuals and pooling of saliva.   Overall, PO consumption is very effortful for pt and therapist has concerns that pt may not be able to maintain nutrition and hydration due to current dietary restrictions in place to minimize aspiration risk.  Would appreciate palliative care consult to determine goals of care. Pt also remains severely dysarthric and needs max to total assist to slow rate and overarticulate to achieve intelligibility at the word level.  Pt was left in bed with bed alarm set and mitt in place.  Continue per current plan of care.    HPI HPI: Patient is a 84 year old male with a history of atrial fibrillation, on Coumadin, hypertension, hyperlipidemia, GERD, Schatzki's ring, hiatal hernia who presented as a code stroke. He was reportedly working outside in his barn and was found by his family to be slumped over at the side of the barn. He has right-sided facial drooping and right side paralysis. CT revealed acute hemorrhage within the left pons, relatively large at up o 20 mm diameter. Per RN reported difficulty swallowing prior to admission.      SLP Plan  Continue with  current plan of care       Recommendations  Diet recommendations: Dysphagia 1 (puree);Nectar-thick liquid Liquids provided via: Teaspoon Medication Administration: Via alternative means Supervision: Staff to assist with self feeding;Full supervision/cueing for compensatory strategies Compensations: Slow rate;Small sips/bites;Monitor for anterior loss;Clear throat intermittently;Multiple dry swallows after each bite/sip;Chin tuck;Minimize environmental distractions Postural Changes and/or Swallow Maneuvers: Seated upright 90 degrees                Oral Care Recommendations: Oral care before and after PO Follow up Recommendations: Skilled Nursing facility;24 hour supervision/assistance SLP Visit Diagnosis: Dysphagia, oropharyngeal phase (R13.12);Dysarthria and anarthria (R47.1) Plan: Continue with current plan of care       GO                Dashawn Bartnick, Melanee Spryicole L 11/06/2017, 11:18 AM

## 2017-11-07 ENCOUNTER — Inpatient Hospital Stay (HOSPITAL_COMMUNITY): Payer: Medicare HMO

## 2017-11-07 DIAGNOSIS — I69392 Facial weakness following cerebral infarction: Secondary | ICD-10-CM | POA: Diagnosis not present

## 2017-11-07 DIAGNOSIS — D72829 Elevated white blood cell count, unspecified: Secondary | ICD-10-CM | POA: Diagnosis not present

## 2017-11-07 DIAGNOSIS — R488 Other symbolic dysfunctions: Secondary | ICD-10-CM | POA: Diagnosis not present

## 2017-11-07 DIAGNOSIS — Z66 Do not resuscitate: Secondary | ICD-10-CM | POA: Diagnosis not present

## 2017-11-07 DIAGNOSIS — I700 Atherosclerosis of aorta: Secondary | ICD-10-CM | POA: Diagnosis present

## 2017-11-07 DIAGNOSIS — T148XXA Other injury of unspecified body region, initial encounter: Secondary | ICD-10-CM | POA: Diagnosis not present

## 2017-11-07 DIAGNOSIS — W19XXXA Unspecified fall, initial encounter: Secondary | ICD-10-CM | POA: Diagnosis not present

## 2017-11-07 DIAGNOSIS — I499 Cardiac arrhythmia, unspecified: Secondary | ICD-10-CM | POA: Diagnosis not present

## 2017-11-07 DIAGNOSIS — R402352 Coma scale, best motor response, localizes pain, at arrival to emergency department: Secondary | ICD-10-CM | POA: Diagnosis present

## 2017-11-07 DIAGNOSIS — R4701 Aphasia: Secondary | ICD-10-CM | POA: Diagnosis not present

## 2017-11-07 DIAGNOSIS — I340 Nonrheumatic mitral (valve) insufficiency: Secondary | ICD-10-CM | POA: Diagnosis not present

## 2017-11-07 DIAGNOSIS — R1312 Dysphagia, oropharyngeal phase: Secondary | ICD-10-CM

## 2017-11-07 DIAGNOSIS — E785 Hyperlipidemia, unspecified: Secondary | ICD-10-CM | POA: Diagnosis not present

## 2017-11-07 DIAGNOSIS — I10 Essential (primary) hypertension: Secondary | ICD-10-CM | POA: Diagnosis not present

## 2017-11-07 DIAGNOSIS — R1310 Dysphagia, unspecified: Secondary | ICD-10-CM | POA: Diagnosis not present

## 2017-11-07 DIAGNOSIS — I679 Cerebrovascular disease, unspecified: Secondary | ICD-10-CM | POA: Diagnosis not present

## 2017-11-07 DIAGNOSIS — E876 Hypokalemia: Secondary | ICD-10-CM | POA: Diagnosis present

## 2017-11-07 DIAGNOSIS — I63233 Cerebral infarction due to unspecified occlusion or stenosis of bilateral carotid arteries: Secondary | ICD-10-CM | POA: Diagnosis not present

## 2017-11-07 DIAGNOSIS — S4991XA Unspecified injury of right shoulder and upper arm, initial encounter: Secondary | ICD-10-CM | POA: Diagnosis not present

## 2017-11-07 DIAGNOSIS — Z8673 Personal history of transient ischemic attack (TIA), and cerebral infarction without residual deficits: Secondary | ICD-10-CM | POA: Diagnosis not present

## 2017-11-07 DIAGNOSIS — I69391 Dysphagia following cerebral infarction: Secondary | ICD-10-CM | POA: Diagnosis not present

## 2017-11-07 DIAGNOSIS — R0789 Other chest pain: Secondary | ICD-10-CM | POA: Diagnosis not present

## 2017-11-07 DIAGNOSIS — S06360A Traumatic hemorrhage of cerebrum, unspecified, without loss of consciousness, initial encounter: Secondary | ICD-10-CM | POA: Diagnosis not present

## 2017-11-07 DIAGNOSIS — H53461 Homonymous bilateral field defects, right side: Secondary | ICD-10-CM | POA: Diagnosis present

## 2017-11-07 DIAGNOSIS — I69191 Dysphagia following nontraumatic intracerebral hemorrhage: Secondary | ICD-10-CM | POA: Diagnosis not present

## 2017-11-07 DIAGNOSIS — S40011A Contusion of right shoulder, initial encounter: Secondary | ICD-10-CM | POA: Diagnosis present

## 2017-11-07 DIAGNOSIS — E7849 Other hyperlipidemia: Secondary | ICD-10-CM

## 2017-11-07 DIAGNOSIS — R402413 Glasgow coma scale score 13-15, at hospital admission: Secondary | ICD-10-CM | POA: Diagnosis present

## 2017-11-07 DIAGNOSIS — G8191 Hemiplegia, unspecified affecting right dominant side: Secondary | ICD-10-CM

## 2017-11-07 DIAGNOSIS — K219 Gastro-esophageal reflux disease without esophagitis: Secondary | ICD-10-CM | POA: Diagnosis present

## 2017-11-07 DIAGNOSIS — I639 Cerebral infarction, unspecified: Secondary | ICD-10-CM | POA: Diagnosis not present

## 2017-11-07 DIAGNOSIS — R402142 Coma scale, eyes open, spontaneous, at arrival to emergency department: Secondary | ICD-10-CM | POA: Diagnosis present

## 2017-11-07 DIAGNOSIS — R451 Restlessness and agitation: Secondary | ICD-10-CM | POA: Diagnosis present

## 2017-11-07 DIAGNOSIS — I4892 Unspecified atrial flutter: Secondary | ICD-10-CM | POA: Diagnosis not present

## 2017-11-07 DIAGNOSIS — R0989 Other specified symptoms and signs involving the circulatory and respiratory systems: Secondary | ICD-10-CM | POA: Diagnosis not present

## 2017-11-07 DIAGNOSIS — I50812 Chronic right heart failure: Secondary | ICD-10-CM | POA: Diagnosis not present

## 2017-11-07 DIAGNOSIS — G460 Middle cerebral artery syndrome: Secondary | ICD-10-CM | POA: Diagnosis not present

## 2017-11-07 DIAGNOSIS — R402222 Coma scale, best verbal response, incomprehensible words, at arrival to emergency department: Secondary | ICD-10-CM | POA: Diagnosis not present

## 2017-11-07 DIAGNOSIS — I63412 Cerebral infarction due to embolism of left middle cerebral artery: Secondary | ICD-10-CM | POA: Diagnosis not present

## 2017-11-07 DIAGNOSIS — I63512 Cerebral infarction due to unspecified occlusion or stenosis of left middle cerebral artery: Secondary | ICD-10-CM | POA: Diagnosis not present

## 2017-11-07 DIAGNOSIS — I69321 Dysphasia following cerebral infarction: Secondary | ICD-10-CM | POA: Diagnosis not present

## 2017-11-07 DIAGNOSIS — I69251 Hemiplegia and hemiparesis following other nontraumatic intracranial hemorrhage affecting right dominant side: Secondary | ICD-10-CM | POA: Diagnosis not present

## 2017-11-07 DIAGNOSIS — R29810 Facial weakness: Secondary | ICD-10-CM | POA: Diagnosis not present

## 2017-11-07 DIAGNOSIS — M6281 Muscle weakness (generalized): Secondary | ICD-10-CM | POA: Diagnosis not present

## 2017-11-07 DIAGNOSIS — Z79899 Other long term (current) drug therapy: Secondary | ICD-10-CM | POA: Diagnosis not present

## 2017-11-07 DIAGNOSIS — S299XXA Unspecified injury of thorax, initial encounter: Secondary | ICD-10-CM | POA: Diagnosis not present

## 2017-11-07 DIAGNOSIS — G464 Cerebellar stroke syndrome: Secondary | ICD-10-CM | POA: Diagnosis not present

## 2017-11-07 DIAGNOSIS — W06XXXA Fall from bed, initial encounter: Secondary | ICD-10-CM | POA: Diagnosis not present

## 2017-11-07 DIAGNOSIS — Z7901 Long term (current) use of anticoagulants: Secondary | ICD-10-CM | POA: Diagnosis not present

## 2017-11-07 DIAGNOSIS — E860 Dehydration: Secondary | ICD-10-CM | POA: Diagnosis not present

## 2017-11-07 DIAGNOSIS — R2689 Other abnormalities of gait and mobility: Secondary | ICD-10-CM | POA: Diagnosis not present

## 2017-11-07 DIAGNOSIS — Z515 Encounter for palliative care: Secondary | ICD-10-CM | POA: Diagnosis not present

## 2017-11-07 DIAGNOSIS — R4702 Dysphasia: Secondary | ICD-10-CM | POA: Diagnosis not present

## 2017-11-07 DIAGNOSIS — I252 Old myocardial infarction: Secondary | ICD-10-CM | POA: Diagnosis not present

## 2017-11-07 DIAGNOSIS — R600 Localized edema: Secondary | ICD-10-CM | POA: Diagnosis not present

## 2017-11-07 DIAGNOSIS — I4891 Unspecified atrial fibrillation: Secondary | ICD-10-CM | POA: Diagnosis not present

## 2017-11-07 DIAGNOSIS — R531 Weakness: Secondary | ICD-10-CM | POA: Diagnosis not present

## 2017-11-07 DIAGNOSIS — E7800 Pure hypercholesterolemia, unspecified: Secondary | ICD-10-CM | POA: Diagnosis present

## 2017-11-07 DIAGNOSIS — I110 Hypertensive heart disease with heart failure: Secondary | ICD-10-CM | POA: Diagnosis not present

## 2017-11-07 DIAGNOSIS — I69322 Dysarthria following cerebral infarction: Secondary | ICD-10-CM | POA: Diagnosis not present

## 2017-11-07 DIAGNOSIS — R29715 NIHSS score 15: Secondary | ICD-10-CM | POA: Diagnosis not present

## 2017-11-07 DIAGNOSIS — S199XXA Unspecified injury of neck, initial encounter: Secondary | ICD-10-CM | POA: Diagnosis not present

## 2017-11-07 DIAGNOSIS — T17908A Unspecified foreign body in respiratory tract, part unspecified causing other injury, initial encounter: Secondary | ICD-10-CM | POA: Diagnosis not present

## 2017-11-07 DIAGNOSIS — I629 Nontraumatic intracranial hemorrhage, unspecified: Secondary | ICD-10-CM | POA: Diagnosis not present

## 2017-11-07 DIAGNOSIS — I6523 Occlusion and stenosis of bilateral carotid arteries: Secondary | ICD-10-CM | POA: Diagnosis present

## 2017-11-07 DIAGNOSIS — S0093XA Contusion of unspecified part of head, initial encounter: Secondary | ICD-10-CM | POA: Diagnosis present

## 2017-11-07 DIAGNOSIS — R063 Periodic breathing: Secondary | ICD-10-CM | POA: Diagnosis not present

## 2017-11-07 DIAGNOSIS — I5032 Chronic diastolic (congestive) heart failure: Secondary | ICD-10-CM | POA: Diagnosis not present

## 2017-11-07 DIAGNOSIS — I739 Peripheral vascular disease, unspecified: Secondary | ICD-10-CM | POA: Diagnosis present

## 2017-11-07 DIAGNOSIS — I69351 Hemiplegia and hemiparesis following cerebral infarction affecting right dominant side: Secondary | ICD-10-CM | POA: Diagnosis not present

## 2017-11-07 DIAGNOSIS — I613 Nontraumatic intracerebral hemorrhage in brain stem: Secondary | ICD-10-CM | POA: Diagnosis not present

## 2017-11-07 DIAGNOSIS — H538 Other visual disturbances: Secondary | ICD-10-CM | POA: Diagnosis not present

## 2017-11-07 DIAGNOSIS — G8190 Hemiplegia, unspecified affecting unspecified side: Secondary | ICD-10-CM | POA: Diagnosis not present

## 2017-11-07 DIAGNOSIS — M25511 Pain in right shoulder: Secondary | ICD-10-CM | POA: Diagnosis not present

## 2017-11-07 DIAGNOSIS — R278 Other lack of coordination: Secondary | ICD-10-CM | POA: Diagnosis not present

## 2017-11-07 DIAGNOSIS — I6300 Cerebral infarction due to thrombosis of unspecified precerebral artery: Secondary | ICD-10-CM | POA: Diagnosis not present

## 2017-11-07 DIAGNOSIS — R52 Pain, unspecified: Secondary | ICD-10-CM | POA: Diagnosis not present

## 2017-11-07 DIAGNOSIS — R079 Chest pain, unspecified: Secondary | ICD-10-CM | POA: Diagnosis not present

## 2017-11-07 DIAGNOSIS — I6789 Other cerebrovascular disease: Secondary | ICD-10-CM | POA: Diagnosis not present

## 2017-11-07 DIAGNOSIS — I484 Atypical atrial flutter: Secondary | ICD-10-CM | POA: Diagnosis not present

## 2017-11-07 DIAGNOSIS — Z87820 Personal history of traumatic brain injury: Secondary | ICD-10-CM | POA: Diagnosis not present

## 2017-11-07 DIAGNOSIS — Y92122 Bedroom in nursing home as the place of occurrence of the external cause: Secondary | ICD-10-CM | POA: Diagnosis not present

## 2017-11-07 DIAGNOSIS — I480 Paroxysmal atrial fibrillation: Secondary | ICD-10-CM | POA: Diagnosis not present

## 2017-11-07 DIAGNOSIS — G8101 Flaccid hemiplegia affecting right dominant side: Secondary | ICD-10-CM | POA: Diagnosis not present

## 2017-11-07 DIAGNOSIS — I619 Nontraumatic intracerebral hemorrhage, unspecified: Secondary | ICD-10-CM | POA: Diagnosis not present

## 2017-11-07 DIAGNOSIS — R471 Dysarthria and anarthria: Secondary | ICD-10-CM | POA: Diagnosis not present

## 2017-11-07 DIAGNOSIS — R0902 Hypoxemia: Secondary | ICD-10-CM | POA: Diagnosis not present

## 2017-11-07 LAB — ECHOCARDIOGRAM COMPLETE
Height: 70 in
Weight: 2663.16 oz

## 2017-11-07 LAB — CBC
HCT: 44.6 % (ref 39.0–52.0)
Hemoglobin: 15 g/dL (ref 13.0–17.0)
MCH: 34.1 pg — AB (ref 26.0–34.0)
MCHC: 33.6 g/dL (ref 30.0–36.0)
MCV: 101.4 fL — AB (ref 78.0–100.0)
PLATELETS: 229 10*3/uL (ref 150–400)
RBC: 4.4 MIL/uL (ref 4.22–5.81)
RDW: 13.4 % (ref 11.5–15.5)
WBC: 11.1 10*3/uL — ABNORMAL HIGH (ref 4.0–10.5)

## 2017-11-07 LAB — BASIC METABOLIC PANEL
Anion gap: 10 (ref 5–15)
BUN: 26 mg/dL — AB (ref 6–20)
CHLORIDE: 110 mmol/L (ref 101–111)
CO2: 21 mmol/L — ABNORMAL LOW (ref 22–32)
Calcium: 8.6 mg/dL — ABNORMAL LOW (ref 8.9–10.3)
Creatinine, Ser: 1.09 mg/dL (ref 0.61–1.24)
GFR calc Af Amer: >60 mL/min (ref >60)
GFR, EST NON AFRICAN AMERICAN: 60 mL/min — AB (ref >60)
Glucose, Bld: 109 mg/dL — ABNORMAL HIGH (ref 65–99)
POTASSIUM: 3.6 mmol/L (ref 3.5–5.1)
SODIUM: 141 mmol/L (ref 135–145)

## 2017-11-07 LAB — GLUCOSE, CAPILLARY
GLUCOSE-CAPILLARY: 97 mg/dL (ref 65–99)
Glucose-Capillary: 109 mg/dL — ABNORMAL HIGH (ref 65–99)

## 2017-11-07 MED ORDER — QUETIAPINE FUMARATE 25 MG PO TABS: 12.5000 mg | ORAL_TABLET | Freq: Two times a day (BID) | ORAL | 0 refills | Status: DC

## 2017-11-07 MED ORDER — ATORVASTATIN CALCIUM 40 MG PO TABS: 40.0000 mg | ORAL_TABLET | Freq: Every day | ORAL | Status: DC

## 2017-11-07 MED ORDER — DILTIAZEM HCL 60 MG PO TABS: 60.0000 mg | ORAL_TABLET | Freq: Four times a day (QID) | ORAL | Status: DC

## 2017-11-07 MED ORDER — ENSURE ENLIVE PO LIQD: 237.0000 mL | Freq: Three times a day (TID) | ORAL | 12 refills | Status: DC

## 2017-11-07 MED ORDER — PANTOPRAZOLE SODIUM 40 MG PO TBEC: 40.0000 mg | DELAYED_RELEASE_TABLET | Freq: Every day | ORAL | Status: DC

## 2017-11-07 MED ORDER — METOPROLOL TARTRATE 50 MG PO TABS
50.0000 mg | ORAL_TABLET | Freq: Two times a day (BID) | ORAL | Status: DC
  Administered 2017-11-07: 50 mg via ORAL

## 2017-11-07 MED ORDER — RESOURCE THICKENUP CLEAR PO POWD: 1.0000 | ORAL | 2 refills | Status: DC | PRN

## 2017-11-07 MED ORDER — METOPROLOL TARTRATE 50 MG PO TABS: 50.0000 mg | ORAL_TABLET | Freq: Two times a day (BID) | ORAL | Status: DC

## 2017-11-07 MED ORDER — PERFLUTREN LIPID MICROSPHERE
1.0000 mL | INTRAVENOUS | Status: AC | PRN
  Administered 2017-11-07: 2 mL via INTRAVENOUS
  Filled 2017-11-07: qty 10

## 2017-11-07 MED ORDER — DOCUSATE SODIUM 100 MG PO CAPS: 100.0000 mg | ORAL_CAPSULE | Freq: Two times a day (BID) | ORAL | 1 refills | Status: DC

## 2017-11-07 NOTE — Discharge Summary (Addendum)
Stroke Discharge Summary  Patient ID: [name]    l   MRN: [id number]      DOB: [birth year]  Date of Admission: 10/27/2017 Date of Discharge: 11/07/2017  Attending Physician:  Marvel PlanXu, Zamiyah Resendes, MD, Stroke MD Consultant(s):   Treatment Team:  [facility]iology, Rounding, MD,  [facility]/internal Medicine Patient's PCP:  Patient, No Pcp Per  DISCHARGE DIAGNOSIS:  Principal Problem:   L [place] ICH (intracerebral hemorrhage) (HCC) Active Problems:   Essential hypertension   Hyperlipidemia   Atrial fibrillation with RVR (HCC)   Atrial flutter with rapid ventricular response (HCC)   Leukocytosis   CHF (congestive heart failure) (HCC)   Dysphagia following intracerebral hemorrhage   Right hemiplegia (HCC) d/t ICH   Past Medical History:  Diagnosis Date  . Atrial fibrillation (HCC)   . Atrial flutter (HCC)   . Atrial flutter (HCC)   . Edema 03/17/2009   Qualifier: Diagnosis of  By: Flonnie OvermanMcLean, Monica    . GERD (gastroesophageal reflux disease)   . HTN (hypertension)   . Hypercholesteremia   . Hyperlipidemia    History reviewed. No pertinent surgical history.  Allergies as of 11/07/2017   No Known Allergies     Medication List    STOP taking these medications   diltiazem 120 MG 24 hr capsule Commonly known as:  CARDIZEM CD   Fish Oil 1000 MG Caps   losartan-hydrochlorothiazide 100-12.5 MG tablet Commonly known as:  HYZAAR   metoprolol succinate 100 MG 24 hr tablet Commonly known as:  TOPROL-XL   warfarin 5 MG tablet Commonly known as:  COUMADIN     TAKE these medications   atorvastatin 40 MG tablet Commonly known as:  LIPITOR Take 1 tablet (40 mg total) by mouth daily at 6 PM. What changed:    medication strength  how much to take  when to take this   CALCIUM 600+D3 PO Take by mouth. TAKE ONE TABLET DAILY   cholecalciferol 1000 units tablet Commonly known as:  VITAMIN D Take 1,000 Units by mouth daily.   diltiazem 60 MG tablet Commonly known as:   CARDIZEM Take 1 tablet (60 mg total) by mouth every 6 (six) hours.   docusate sodium 100 MG capsule Commonly known as:  COLACE Take 1 capsule (100 mg total) by mouth 2 (two) times daily.   feeding supplement (ENSURE ENLIVE) Liqd Take 237 mLs by mouth 3 (three) times daily between meals.   metoprolol tartrate 50 MG tablet Commonly known as:  LOPRESSOR Take 1 tablet (50 mg total) by mouth 2 (two) times daily. Notes to patient:  Last given 11/07/2017 at 1019   multivitamin with minerals tablet Take 1 tablet by mouth daily. Notes to patient:  Last given 11/07/2017 at 1019   pantoprazole 40 MG tablet Commonly known as:  PROTONIX Take 1 tablet (40 mg total) by mouth at bedtime.   QUEtiapine 25 MG tablet Commonly known as:  SEROQUEL Take 0.5 tablets (12.5 mg total) by mouth 2 (two) times daily. Notes to patient:  Last given 11/07/2017 at 1019   RESOURCE THICKENUP CLEAR Powd Take 120 g by mouth as needed (for nectar thick liquids, adjust thickness per SLP recommendation).       LABORATORY STUDIES CBC    Component Value Date/Time   WBC 11.1 (H) 11/07/2017 0820   RBC 4.40 11/07/2017 0820   HGB 15.0 11/07/2017 0820   HCT 44.6 11/07/2017 0820   PLT 229 11/07/2017 0820   MCV 101.4 (H) 11/07/2017 0820  MCH 34.1 (H) 11/07/2017 0820   MCHC 33.6 11/07/2017 0820   RDW 13.4 11/07/2017 0820   LYMPHSABS 3.0 2017-11-17 1038   MONOABS 0.5 11-17-17 1038   EOSABS 0.2 11-17-17 1038   BASOSABS 0.0 November 17, 2017 1038   CMP    Component Value Date/Time   NA 141 11/07/2017 0820   K 3.6 11/07/2017 0820   CL 110 11/07/2017 0820   CO2 21 (L) 11/07/2017 0820   GLUCOSE 109 (H) 11/07/2017 0820   BUN 26 (H) 11/07/2017 0820   CREATININE 1.09 11/07/2017 0820   CALCIUM 8.6 (L) 11/07/2017 0820   PROT 7.1 11-17-2017 1038   ALBUMIN 3.9 2017-11-17 1038   AST 44 (H) 11/17/2017 1038   ALT 34 11/17/2017 1038   ALKPHOS 86 Nov 17, 2017 1038   BILITOT 1.0 2017/11/17 1038   GFRNONAA 60 (L)  11/07/2017 0820   GFRAA >60 11/07/2017 0820   COAGS Lab Results  Component Value Date   INR 1.26 10/30/2017   INR 1.15 10/29/2017   INR 1.25 10/28/2017   Lipid Panel    Component Value Date/Time   CHOL 154 17-Nov-2017 1240   TRIG 105 11-17-2017 1240   HDL 42 2017-11-17 1240   CHOLHDL 3.7 November 17, 2017 1240   VLDL 21 Nov 17, 2017 1240   LDLCALC 91 11/17/2017 1240   HgbA1C  Lab Results  Component Value Date   HGBA1C 5.8 (H) 11-17-17   Urinalysis    Component Value Date/Time   COLORURINE YELLOW 11/05/2017 0331   APPEARANCEUR CLEAR 11/05/2017 0331   LABSPEC 1.009 11/05/2017 0331   PHURINE 8.0 11/05/2017 0331   GLUCOSEU NEGATIVE 11/05/2017 0331   HGBUR SMALL (A) 11/05/2017 0331   BILIRUBINUR NEGATIVE 11/05/2017 0331   KETONESUR NEGATIVE 11/05/2017 0331   PROTEINUR 30 (A) 11/05/2017 0331   NITRITE NEGATIVE 11/05/2017 0331   LEUKOCYTESUR NEGATIVE 11/05/2017 0331   Urine Drug Screen No results found for: LABOPIA, COCAINSCRNUR, LABBENZ, AMPHETMU, THCU, LABBARB  Alcohol Level No results found for: [facility]   SIGNIFICANT DIAGNOSTIC STUDIES CT HEAD WITHOUT CONTRAST 11/17/2017 10:53 IMPRESSION: 1. Acute hemorrhage within the left pons, relatively large at up to 20 mm diameter (estimated blood volume 2 mL). No extra-axial or intraventricular extension at this time. No significant intracranial mass effect. No ventriculomegaly. 2. ASPECTS is not applicable, acute hemorrhage.  CT HEAD WITHOUT CONTRAST 11/17/2017 15:53 IMPRESSION: 1. Acute hemorrhage in the left pons is stable since 1045 hours today, blood volume estimated at 2 mL. 2. No new intracranial abnormality.  PORTABLE CHEST 1 VIEW 10/30/2017 00:17 IMPRESSION: Hypoventilatory chest with mild bibasilar atelectasis, which may be secondary to low lung volumes or seen with aspiration. No confluent airspace disease. Unchanged cardiomegaly  PORTABLE CHEST 1 VIEW 11/03/2017 08:03 IMPRESSION: No active disease.      HISTORY OF PRESENT ILLNESS [name] is an 84 y.o. male with a PMH of HTN, HLD and AFIB on Coumadin brought into the ED by EMS with reports of acute onset Right sided weakness,facial droop, dysarthria and left gaze preference. Patient was found down outside near his barn. Per reports her was just about to climb a ladder when he fell to the ground. B/P in the field was 168/82 and pulse of 118. Patient is able to tell us that he is compliant with his Coumadin. Family denies any recent illness, medication changes or hospitalizations.  His last INR check was 3.9 on 10/08/2017 per the medical record.   On admission the INR is 2.33 Initial Head CT shows large ICH Left pons  IV Kcentra and vitamin K in administered  Date last known well: Date: 10/27/2017 Time last known well: Time: 09:25 Modified Rankin: Rankin Score=0 NIHSS: 63    HOSPITAL COURSE [name] is a 84 y.o. male with history of atrial fibrillation on Coumadin, hypertension and hyperlipidemia, presenting with acute onset right-sided weakness, facial droop, dysarthria, and left gaze preference. CT show a L pontine ICH.  Left pontine ICH - on Coumadin with INR 2.33 on admission. Etiology indeterminate underlying cavernoma versus hypertensive plus warfarin related coagulopathy  Resultant dysarthria, dysphagia, right facial droop and right hemiplegia  CT head - acute hemorrhage within the left pons  Repeat CT stable  LDL - 91  HgbA1c - 5.8  warfarin daily prior to admission, INR 2.33 reversed . Down to 1.26, now on No antithrombotic. Anticoagulation not recommended life-long.  Ongoing aggressive stroke risk factor management  Therapy recommendations:  SNF  Disposition:  SNF ([facility] - lived with demented wife PTA, daughter who works at [employer] involved in care)  Leukocytosis  Down to WBC 11.1 today  Remains afebrile  CXR 3/11  - no acute findings  U/A 3/11 - negative   Atrial Fib w/  RVR  Intermittent RVR, AFlutter  Increased Metoprolol to 50 mg BID, 11/02/2017  IV Cardizem was started for Aflutter and elevated HR, now discontinued and PO Cardizem resumed at divided dose q 4h  Cardiology consultation - Appreciate Recs  Elevated only once during the night, stable this am  Hypertension  Home meds of Cardizem. Losartan/HCTZ and metoprolol  Long-term BP goal normotensive  Variable - as low as 97/67 and as high as 173/100 during the past 24h  Resume Losartan in the future if fetter B/P control needed  Hyperlipidemia  Home meds: Lipitor 20 mg daily prior to admission   LDL 91, goal < 70  Continue lipitor 40 mg daily at discharge  Dysphagia  SLP following   tube feeding off for 2 days with ability to eat 50% of meal, Cortrak discontinued morning of discharge  Calorie count underway  Dietitian consulted in hosptial  Feeding assistance with all meals. Ensure TID  Per Daughter, po intake not good prior to admission. Agreeable to avoid permanent tube feeding at this time as he is taking in nourishment.   SLP to continue to assess at discharge  Other Stroke Risk Factors  Advanced age   DISCHARGE EXAM Blood pressure 131/75, pulse 74, temperature 99.3 F (37.4 C), temperature source Axillary, resp. rate 20, height 5\' 10"  (1.778 m), weight 75.5 kg (166 lb 7.2 oz), SpO2 97 %. Pleasant frail  elderly Caucasian male currently not in distress. Afebrile. Head is nontraumatic. Neck is supple without bruit. Cardiac exam no murmur or gallop, persistent A. fib rhythm. Lungs are clear to auscultation.  Neurological Exam :  Awake alert, follows simple commands.  Lying in bed in NAD.  Severe dysarthria with expressive aphasia.  Left gaze improved. Blinks to threat on the left but not on the right.  Right lower facial weakness.  Tongue midline. Dense right hemiplegia with withdrawal to stimuli in the right upper extremity and lower extremity. Purposeful antigravity  strength on the left without weakness. Diminished sensation on the right hemibody compared to the left.  Tone is diminished on the right compared to the left.  Right plantar upgoing with triple reflex and left downgoing.  Gait not tested.   Discharge Diet   Dysphagia I, Nectar Thick liquids  DISCHARGE PLAN  Disposition:  [facility] for ongoing PT, OT and ST.   Ongoing risk factor control by SNF attending and team.   Follow-up SNF attending/team for medical care  Follow-up with Dr. Delia Heady, Stroke Clinic in 6 weeks, office to schedule an appointment.  60 minutes were spent preparing discharge.  Rhoderick Moody [facility] See Amion for Pager information 11/07/2017 11:23 AM    ATTENDING NOTE: I reviewed above note and agree with the assessment and plan. I have made any additions or clarifications directly to the above note. Pt was seen and examined.   Pt neurologically stable. No acute event overnight.  Heart rate under control.  Able to eat by mouth, 50% of the food.  Will DC NG tube and transfer to SNF. Pt will follow up with Dr. Pearlean Brownie at [facility] in about 4-6 weeks. Thanks for the consult.   Marvel Plan, MD PhD Stroke Neurology 11/07/2017 6:23 PM

## 2017-11-07 NOTE — Consult Note (Signed)
Medical Consultation   [name]  ZOX:[id number]  DOB: [birth year]  DOA: 10/27/2017  PCP: Patient, No Pcp Per   Outpatient Specialists: Dr. Katrinka Blazing (Cards)   Requesting physician: Dr. Wilford Corner  Reason for consultation: A.Fib RVR   History of Present Illness: [name] is an 84 y.o. male with h/o A.Fib on coumadin, HTN, HLD.  Patient was admitted on 3/2 with Large L pons ICH.  Anticoagulation was reversed.  Due to severe deficits NG tube has been placed, he is currently awaiting SNF placement.  His stay has been complicated by intermittent RVR of his chronic a.fib.  This has been resistant to PO cardizem, PO scheduled metoprolol and IV PRN metoprolol.  Hospitalist consulted.  11/06/2017: Patient seen and examined at his bedside.  He is alert.  Difficult to understand due to expressive aphasia.  Does not appear to be in any acute distress.  Blood pressure and heart rate improved from yesterday.  11/06/2017: Patient seen and examined at his bedside.  He is alert.  Has expressive aphasia which makes it difficult to get a history from him.  RN reports 2-second pause on telemetry.  Cardiology made aware.  Cortract in place.  Per RN the patient has had limited oral intake.   Review of Systems:  ROS As per HPI otherwise 10 point review of systems negative.   Past Medical History: Past Medical History:  Diagnosis Date  . Atrial fibrillation (HCC)   . Atrial flutter (HCC)   . Atrial flutter (HCC)   . Edema 03/17/2009   Qualifier: Diagnosis of  By: Flonnie Overman    . GERD (gastroesophageal reflux disease)   . HTN (hypertension)   . Hypercholesteremia   . Hyperlipidemia     Past Surgical History: History reviewed. No pertinent surgical history.   Allergies:  No Known Allergies   Social History:  reports that  has never smoked. he has never used smokeless tobacco. He reports that he does not drink alcohol or use drugs.   Family History: Family History    Problem Relation Age of Onset  . Healthy Mother   . Other Father        "too much iron in blood"  . Healthy Sister      Physical Exam: Vitals:   11/07/17 0504 11/07/17 0602 11/07/17 0611 11/07/17 0754  BP: (!) 136/91 97/67  131/75  Pulse: 85 67  74  Resp: (!) 22 (!) 21  20  Temp:    99.3 F (37.4 C)  TempSrc:    Axillary  SpO2: 96% 97%  97%  Weight:   74 kg (163 lb 2.3 oz) 75.5 kg (166 lb 7.2 oz)  Height:       11/07/2017: Patient seen and examined.  Physical exam essentially unchanged from prior.  Except for what is mentioned below.  Constitutional: 84 year old Caucasian male well-developed well-nourished sitting up in bed in no acute distress.  Alert and follows simple commands.   Eyes: Pupils are round and reactive to light.  No icterus noted. ENMT dry mucosa with no erythema or exudates. \ Neck: No JVD or thyromegaly CVS: Irregular rate and rhythm with no rubs or gallops.  Respiratory: Clear to auscultation with no wheezes or rales Abdomen: Soft nontender nondistended normal bowel sounds x4 quadrant Musculoskeletal: Right upper and right lower extremity weakness: Neuro: Dysarthria with expressive aphasia.  Right upper and right lower extremity weakness.  Psych: Mood is appropriate for condition and setting.   Skin: Mild bruising at the puncture sites   Data reviewed:  I have personally reviewed following labs and imaging studies Labs:  CBC: Recent Labs  Lab 11/03/17 0724 11/04/17 0459 11/05/17 0616 11/06/17 0356 11/07/17 0820  WBC 11.9* 9.7 13.4* 11.7* 11.1*  HGB 13.8 14.2 16.3 15.1 15.0  HCT 41.3 42.3 47.4 43.0 44.6  MCV 102.0* 101.2* 100.4* 99.8 101.4*  PLT 165 192 231 221 229    Basic Metabolic Panel: Recent Labs  Lab 11/02/17 0338 11/03/17 0724 11/04/17 0459 11/05/17 0616 11/06/17 0356 11/07/17 0820  NA 140 140 141 140 140 141  K 3.7 3.7 3.5 3.8 3.8 3.6  CL 107 108 108 106 109 110  CO2 23 22 24 23 22  21*  GLUCOSE 120* 149* 128* 139* 123* 109*   BUN 22* 28* 24* 19 24* 26*  CREATININE 0.96 1.07 0.92 0.95 1.05 1.09  CALCIUM 8.9 8.5* 8.5* 9.0 8.8* 8.6*  MG 2.1  --   --   --   --   --    GFR Estimated Creatinine Clearance: 51.2 mL/min (by C-G formula based on SCr of 1.09 mg/dL). Liver Function Tests: No results for input(s): AST, ALT, ALKPHOS, BILITOT, PROT, ALBUMIN in the last 168 hours. No results for input(s): LIPASE, AMYLASE in the last 168 hours. No results for input(s): AMMONIA in the last 168 hours. Coagulation profile No results for input(s): INR, PROTIME in the last 168 hours.  Cardiac Enzymes: No results for input(s): CKTOTAL, CKMB, CKMBINDEX, TROPONINI in the last 168 hours. BNP: Invalid input(s): POCBNP CBG: Recent Labs  Lab 11/05/17 0358 11/05/17 2021 11/06/17 1640 11/06/17 2207 11/07/17 0704  GLUCAP 118* 135* 105* 150* 109*   D-Dimer No results for input(s): DDIMER in the last 72 hours. Hgb A1c No results for input(s): HGBA1C in the last 72 hours. Lipid Profile No results for input(s): CHOL, HDL, LDLCALC, TRIG, CHOLHDL, LDLDIRECT in the last 72 hours. Thyroid function studies No results for input(s): TSH, T4TOTAL, T3FREE, THYROIDAB in the last 72 hours.  Invalid input(s): FREET3 Anemia work up No results for input(s): VITAMINB12, FOLATE, FERRITIN, TIBC, IRON, RETICCTPCT in the last 72 hours. Urinalysis    Component Value Date/Time   COLORURINE YELLOW 11/05/2017 0331   APPEARANCEUR CLEAR 11/05/2017 0331   LABSPEC 1.009 11/05/2017 0331   PHURINE 8.0 11/05/2017 0331   GLUCOSEU NEGATIVE 11/05/2017 0331   HGBUR SMALL (A) 11/05/2017 0331   BILIRUBINUR NEGATIVE 11/05/2017 0331   KETONESUR NEGATIVE 11/05/2017 0331   PROTEINUR 30 (A) 11/05/2017 0331   NITRITE NEGATIVE 11/05/2017 0331   LEUKOCYTESUR NEGATIVE 11/05/2017 0331     Microbiology No results found for this or any previous visit (from the past 240 hour(s)).     Inpatient Medications:   Scheduled Meds: . atorvastatin  40 mg Per NG  tube q1800  . diltiazem  60 mg Oral Q6H  . sennosides  5 mL Per Tube BID   And  . docusate  50 mg Per Tube BID  . feeding supplement (ENSURE ENLIVE)  237 mL Oral TID BM  . feeding supplement (PRO-STAT SUGAR FREE 64)  30 mL Per Tube Daily  . metoprolol tartrate  50 mg Oral BID  . pantoprazole  40 mg Oral QHS  . QUEtiapine  12.5 mg Oral BID  . RESOURCE THICKENUP CLEAR   Oral TID PC & HS   Continuous Infusions: . sodium chloride 40 mL/hr at 11/06/17 1755  Radiological Exams on Admission: No results found.  Impression/Recommendations Principal Problem:   L Pontine ICH (intracerebral hemorrhage) (HCC) Active Problems:   Essential hypertension   Hyperlipidemia   Atrial fibrillation with RVR (HCC)   Atrial flutter with rapid ventricular response (HCC)   Leukocytosis   CHF (congestive heart failure) (HCC)   Dysphagia following intracerebral hemorrhage   Right hemiplegia (HCC) d/t ICH  A. fib/A flutter with RVR, now rate controlled Improving Continue beta-blockade Cardizem drip as needed Lopressor IV as needed for heart rate greater than 120 Cardiology is following  2-second pause as reported by RN Cardiology following Continue close monitoring on telemetry  Hypertension, now controlled Continue home medications On metoprolol expressive aphasia  Right hemiparesis/expressive aphasia post stroke Primary team following  History of intracranial hemorrhage No anticoagulation  Dysphagia secondary to stroke Improving Speech therapy following and recommended pured and nectar thick liquids  Depression/anxiety Continue Seroquel   Thank you for this consultation.  Our [facility] hospitalist team will follow the patient with you.     Darlin Drop D.O. [facility] 11/07/2017, 2:43 PM

## 2017-11-07 NOTE — Progress Notes (Signed)
PT Cancellation Note  Patient Details Name: [name] MRN: [id number] DOB: September 01, 1929   Cancelled Treatment:    Reason Eval/Treat Not Completed: Fatigue/lethargy limiting ability to participate Pt difficult to arouse today for PT session. Supposed to be d/cing to SNF this PM. Will follow.   Blake DivineShauna A Keliyah Spillman 11/07/2017, 12:10 PM Mylo RedShauna Xolani Degracia, PT, DPT [phone]

## 2017-11-07 NOTE — Progress Notes (Signed)
Awaiting 2D echo - if normal no further cardiac recs.

## 2017-11-07 NOTE — Progress Notes (Signed)
Nurse received call from central tele stated pt had a 2.63 sec pause. MD Margo Aye(Hall) and MD Mayford Knife(Turner) notify. Will continue to monitor.

## 2017-11-07 NOTE — Care Management Note (Signed)
Case Management Note  Patient Details  Name: Alexander PatchCarlton R Flores MRN: [id number] Date of Birth: 06-Sep-1929  Subjective/Objective:                    Action/Plan: Pt is discharging to [facility] today. CM signing off.   Expected Discharge Date:  11/07/17               Expected Discharge Plan:  Skilled Nursing Facility  In-House Referral:  Clinical Social Work  Discharge planning Services     Post Acute Care Choice:    Choice offered to:     DME Arranged:    DME Agency:     HH Arranged:    HH Agency:     Status of Service:  Completed, signed off  If discussed at [employer] Meetings, dates discussed:    Additional Comments:  Kermit BaloKelli F Lewis Keats, RN 11/07/2017, 11:24 AM

## 2017-11-07 NOTE — Progress Notes (Signed)
Calorie Count Note  48 hour calorie count ordered. Day 2 of 2  Diet: Dysphagia 1, Nectar Thick Supplements: 30 ml Prostat daily (via tube), Ensure Enlive po TID, each supplement provides 350 kcal and 20 grams of protein  Patient was not alert today. Did not eat yesterday. Cortrak still in place. Discharge summary in for [facility]. Ate some breakfast today, no lunch.  Breakfast:  Orange magic cup Cup of yogurt Bites of grits  Lunch: None  Total intake: 478 kcal (27% of minimum estimated needs)  17 protein (18% of minimum estimated needs)  Nutrition Dx: Inadequate oral intake related to dysphagia, lethargy as evidenced by meal completion <25%, pt/family report; ongoing  Goal: Patient will meet greater than or equal to 90% of their needs; unmet  Intervention:  -Continue Ensure Enlive po TID, each supplement provides 350 kcal and 20 grams of protein -Continue 30 ml Prostat daily, each supplement provides 100 kcals and 15 grams protein -Magic Cup TID with meals, each supplement provides 290 kcals and 9 grams protein -If PO intake remains inadequate, consider resumption of TF for nutrition support. Recommend:  Initiate Jevity 1.2 @ 20 ml/hr via cortrak tube and increase by 10 ml every 4 hours to goal rate of 60 ml/hr.   30mL pro-stat daily  Regimen provides 1828 calories, 95 grams of protein and 1162mL free water.  Dionne AnoWilliam M. Keidy Thurgood, MS, RD LDN Inpatient Clinical Dietitian Pager [phone]

## 2017-11-07 NOTE — Clinical Social Work Placement (Addendum)
Nurse to call report to [phone], Room 1104b   Nurse to call patient's daughter, [name], when patient is picked up: [phone].    CLINICAL SOCIAL WORK PLACEMENT  NOTE  Date:  11/07/2017  Patient Details  Name: [name] MRN: [id number] Date of Birth: Nov 27, 1929  Clinical Social Work is seeking post-discharge placement for this patient at the Skilled  Nursing Facility level of care (*CSW will initial, date and re-position this form in  chart as items are completed):  Yes   Patient/family provided with [place] Clinical Social Work Department's list of facilities offering this level of care within the geographic area requested by the patient (or if unable, by the patient's family).  Yes   Patient/family informed of their freedom to choose among providers that offer the needed level of care, that participate in Medicare, Medicaid or managed care program needed by the patient, have an available bed and are willing to accept the patient.  Yes   Patient/family informed of [facility]'s ownership interest in [facility] and [facility], as well as of the fact that they are under no obligation to receive care at these facilities.  PASRR submitted to EDS on       PASRR number received on       Existing PASRR number confirmed on       FL2 transmitted to all facilities in geographic area requested by pt/family on       FL2 transmitted to all facilities within larger geographic area on       Patient informed that his/her managed care company has contracts with or will negotiate with certain facilities, including the following:        Yes   Patient/family informed of bed offers received.  Patient [facility] bed at [facility]     Physician recommends and patient chooses bed at      Patient to be [facility] on 11/07/17.  Patient to be transferred to facility by [facility]     Patient family notified on 11/07/17 of transfer.  Name of family member  notified:  [name]     PHYSICIAN       Additional Comment:    _______________________________________________ Baldemar LenisElizabeth M Jadin Kagel, [name] 11/07/2017, 4:24 PM

## 2017-11-07 NOTE — Progress Notes (Signed)
Called daughter [name] at [phone] to let her know her father just left. Via note it was requested.

## 2017-11-07 NOTE — Progress Notes (Signed)
  Echocardiogram 2D Echocardiogram has been performed.  Extremely difficult study due to poor patient compliance and rapid breathing.  [name] L Androw 11/07/2017, 3:35 PM

## 2017-11-07 NOTE — Progress Notes (Signed)
Nurse called report and spoke with Molly Maduroobert.

## 2017-11-07 NOTE — Telephone Encounter (Signed)
Thanks for the update.  I will try to stop by.

## 2017-11-07 NOTE — Progress Notes (Signed)
CSW following for discharge plan. CSW contacted by Admissions at [facility] that the facility has received insurance authorization for patient to admit for rehab. Patient is not medically stable at this time, per MD; hopefully tomorrow. CSW updated Admissions at [facility]. Authorization through [employer] will remain approved for 48 hours.  CSW will continue to follow.  Marino Rogerson, [name] Clinical Social Worker [phone]  

## 2017-11-07 NOTE — Progress Notes (Signed)
SLP Cancellation Note  Patient Details Name: [name] MRN: [id number] DOB: February 27, 1930   Cancelled treatment:       Reason Eval/Treat Not Completed: Fatigue/lethargy limiting ability to participate.  Attempted to see pt x2.  Both times pt was somnolent and therapist was unable to awaken sufficiently to meaningfully participate in therapy.  Per report, pt is discharging to SNF this afternoon.  Recommend ST follow up at next level of care.     Azani Brogdon, Melanee SpryNicole L 11/07/2017, 11:38 AM

## 2017-11-07 NOTE — Progress Notes (Signed)
Pt leaving. Upper dentures placed in container and went with Pt.

## 2017-11-08 DIAGNOSIS — R2689 Other abnormalities of gait and mobility: Secondary | ICD-10-CM | POA: Diagnosis not present

## 2017-11-08 DIAGNOSIS — I69251 Hemiplegia and hemiparesis following other nontraumatic intracranial hemorrhage affecting right dominant side: Secondary | ICD-10-CM | POA: Diagnosis not present

## 2017-11-08 DIAGNOSIS — I69322 Dysarthria following cerebral infarction: Secondary | ICD-10-CM | POA: Diagnosis not present

## 2017-11-08 DIAGNOSIS — R1310 Dysphagia, unspecified: Secondary | ICD-10-CM | POA: Diagnosis not present

## 2017-11-11 ENCOUNTER — Emergency Department (HOSPITAL_COMMUNITY): Payer: Medicare HMO

## 2017-11-11 ENCOUNTER — Inpatient Hospital Stay (HOSPITAL_COMMUNITY)
Admission: EM | Admit: 2017-11-11 | Discharge: 2017-11-16 | DRG: 309 | Disposition: A | Discharge status: Skilled Nursing Facility | Payer: Medicare HMO | Attending: Internal Medicine | Admitting: Internal Medicine

## 2017-11-11 ENCOUNTER — Encounter (HOSPITAL_COMMUNITY): Payer: Self-pay

## 2017-11-11 DIAGNOSIS — I69322 Dysarthria following cerebral infarction: Secondary | ICD-10-CM

## 2017-11-11 DIAGNOSIS — I484 Atypical atrial flutter: Secondary | ICD-10-CM | POA: Diagnosis not present

## 2017-11-11 DIAGNOSIS — E876 Hypokalemia: Secondary | ICD-10-CM | POA: Diagnosis present

## 2017-11-11 DIAGNOSIS — E860 Dehydration: Secondary | ICD-10-CM | POA: Diagnosis not present

## 2017-11-11 DIAGNOSIS — R451 Restlessness and agitation: Secondary | ICD-10-CM | POA: Diagnosis present

## 2017-11-11 DIAGNOSIS — I10 Essential (primary) hypertension: Secondary | ICD-10-CM | POA: Diagnosis not present

## 2017-11-11 DIAGNOSIS — Y92122 Bedroom in nursing home as the place of occurrence of the external cause: Secondary | ICD-10-CM | POA: Diagnosis not present

## 2017-11-11 DIAGNOSIS — S0093XA Contusion of unspecified part of head, initial encounter: Secondary | ICD-10-CM | POA: Diagnosis present

## 2017-11-11 DIAGNOSIS — I110 Hypertensive heart disease with heart failure: Secondary | ICD-10-CM | POA: Diagnosis present

## 2017-11-11 DIAGNOSIS — D72829 Elevated white blood cell count, unspecified: Secondary | ICD-10-CM | POA: Diagnosis present

## 2017-11-11 DIAGNOSIS — I69392 Facial weakness following cerebral infarction: Secondary | ICD-10-CM | POA: Diagnosis not present

## 2017-11-11 DIAGNOSIS — K219 Gastro-esophageal reflux disease without esophagitis: Secondary | ICD-10-CM | POA: Diagnosis present

## 2017-11-11 DIAGNOSIS — R4702 Dysphasia: Secondary | ICD-10-CM | POA: Diagnosis not present

## 2017-11-11 DIAGNOSIS — I619 Nontraumatic intracerebral hemorrhage, unspecified: Secondary | ICD-10-CM | POA: Diagnosis not present

## 2017-11-11 DIAGNOSIS — M6281 Muscle weakness (generalized): Secondary | ICD-10-CM | POA: Diagnosis not present

## 2017-11-11 DIAGNOSIS — R1312 Dysphagia, oropharyngeal phase: Secondary | ICD-10-CM | POA: Diagnosis not present

## 2017-11-11 DIAGNOSIS — M25511 Pain in right shoulder: Secondary | ICD-10-CM | POA: Diagnosis not present

## 2017-11-11 DIAGNOSIS — E785 Hyperlipidemia, unspecified: Secondary | ICD-10-CM | POA: Diagnosis present

## 2017-11-11 DIAGNOSIS — I5032 Chronic diastolic (congestive) heart failure: Secondary | ICD-10-CM | POA: Diagnosis present

## 2017-11-11 DIAGNOSIS — W19XXXA Unspecified fall, initial encounter: Secondary | ICD-10-CM

## 2017-11-11 DIAGNOSIS — S199XXA Unspecified injury of neck, initial encounter: Secondary | ICD-10-CM | POA: Diagnosis not present

## 2017-11-11 DIAGNOSIS — Z7901 Long term (current) use of anticoagulants: Secondary | ICD-10-CM

## 2017-11-11 DIAGNOSIS — I639 Cerebral infarction, unspecified: Secondary | ICD-10-CM | POA: Diagnosis not present

## 2017-11-11 DIAGNOSIS — R402413 Glasgow coma scale score 13-15, at hospital admission: Secondary | ICD-10-CM | POA: Diagnosis present

## 2017-11-11 DIAGNOSIS — R29715 NIHSS score 15: Secondary | ICD-10-CM | POA: Diagnosis not present

## 2017-11-11 DIAGNOSIS — I4891 Unspecified atrial fibrillation: Secondary | ICD-10-CM | POA: Diagnosis present

## 2017-11-11 DIAGNOSIS — I629 Nontraumatic intracranial hemorrhage, unspecified: Secondary | ICD-10-CM | POA: Diagnosis not present

## 2017-11-11 DIAGNOSIS — W06XXXA Fall from bed, initial encounter: Secondary | ICD-10-CM | POA: Diagnosis present

## 2017-11-11 DIAGNOSIS — S299XXA Unspecified injury of thorax, initial encounter: Secondary | ICD-10-CM | POA: Diagnosis not present

## 2017-11-11 DIAGNOSIS — Z66 Do not resuscitate: Secondary | ICD-10-CM | POA: Diagnosis present

## 2017-11-11 DIAGNOSIS — I252 Old myocardial infarction: Secondary | ICD-10-CM | POA: Diagnosis not present

## 2017-11-11 DIAGNOSIS — R278 Other lack of coordination: Secondary | ICD-10-CM | POA: Diagnosis not present

## 2017-11-11 DIAGNOSIS — I69321 Dysphasia following cerebral infarction: Secondary | ICD-10-CM

## 2017-11-11 DIAGNOSIS — I69391 Dysphagia following cerebral infarction: Secondary | ICD-10-CM | POA: Diagnosis not present

## 2017-11-11 DIAGNOSIS — S4991XA Unspecified injury of right shoulder and upper arm, initial encounter: Secondary | ICD-10-CM | POA: Diagnosis not present

## 2017-11-11 DIAGNOSIS — T148XXA Other injury of unspecified body region, initial encounter: Secondary | ICD-10-CM | POA: Diagnosis not present

## 2017-11-11 DIAGNOSIS — Z79899 Other long term (current) drug therapy: Secondary | ICD-10-CM | POA: Diagnosis not present

## 2017-11-11 DIAGNOSIS — R079 Chest pain, unspecified: Secondary | ICD-10-CM | POA: Diagnosis not present

## 2017-11-11 DIAGNOSIS — I499 Cardiac arrhythmia, unspecified: Secondary | ICD-10-CM | POA: Diagnosis not present

## 2017-11-11 DIAGNOSIS — R471 Dysarthria and anarthria: Secondary | ICD-10-CM | POA: Diagnosis not present

## 2017-11-11 DIAGNOSIS — I69351 Hemiplegia and hemiparesis following cerebral infarction affecting right dominant side: Secondary | ICD-10-CM | POA: Diagnosis not present

## 2017-11-11 DIAGNOSIS — R2689 Other abnormalities of gait and mobility: Secondary | ICD-10-CM | POA: Diagnosis not present

## 2017-11-11 DIAGNOSIS — G8191 Hemiplegia, unspecified affecting right dominant side: Secondary | ICD-10-CM | POA: Diagnosis not present

## 2017-11-11 DIAGNOSIS — R488 Other symbolic dysfunctions: Secondary | ICD-10-CM | POA: Diagnosis not present

## 2017-11-11 DIAGNOSIS — S40011A Contusion of right shoulder, initial encounter: Secondary | ICD-10-CM | POA: Diagnosis present

## 2017-11-11 DIAGNOSIS — R531 Weakness: Secondary | ICD-10-CM | POA: Diagnosis not present

## 2017-11-11 DIAGNOSIS — S06360A Traumatic hemorrhage of cerebrum, unspecified, without loss of consciousness, initial encounter: Secondary | ICD-10-CM | POA: Diagnosis not present

## 2017-11-11 DIAGNOSIS — E7849 Other hyperlipidemia: Secondary | ICD-10-CM | POA: Diagnosis not present

## 2017-11-11 DIAGNOSIS — R0789 Other chest pain: Secondary | ICD-10-CM | POA: Diagnosis not present

## 2017-11-11 HISTORY — DX: Cerebral infarction, unspecified: I63.9

## 2017-11-11 LAB — CBC
HCT: 49.3 % (ref 39.0–52.0)
HEMOGLOBIN: 17.5 g/dL — AB (ref 13.0–17.0)
MCH: 35.2 pg — AB (ref 26.0–34.0)
MCHC: 35.5 g/dL (ref 30.0–36.0)
MCV: 99.2 fL (ref 78.0–100.0)
Platelets: 230 10*3/uL (ref 150–400)
RBC: 4.97 MIL/uL (ref 4.22–5.81)
RDW: 12.9 % (ref 11.5–15.5)
WBC: 14.2 10*3/uL — ABNORMAL HIGH (ref 4.0–10.5)

## 2017-11-11 LAB — BASIC METABOLIC PANEL
Anion gap: 15 (ref 5–15)
BUN: 20 mg/dL (ref 6–20)
CO2: 20 mmol/L — ABNORMAL LOW (ref 22–32)
CREATININE: 1.19 mg/dL (ref 0.61–1.24)
Calcium: 9.1 mg/dL (ref 8.9–10.3)
Chloride: 106 mmol/L (ref 101–111)
GFR calc Af Amer: >60 mL/min (ref >60)
GFR, EST NON AFRICAN AMERICAN: 54 mL/min — AB (ref >60)
Glucose, Bld: 98 mg/dL (ref 65–99)
Potassium: 3.7 mmol/L (ref 3.5–5.1)
SODIUM: 141 mmol/L (ref 135–145)

## 2017-11-11 LAB — MAGNESIUM: MAGNESIUM: 2 mg/dL (ref 1.7–2.4)

## 2017-11-11 LAB — I-STAT TROPONIN, ED: Troponin i, poc: 0 ng/mL (ref 0.00–0.08)

## 2017-11-11 LAB — T4, FREE: Free T4: 1.28 ng/dL — ABNORMAL HIGH (ref 0.61–1.12)

## 2017-11-11 LAB — BRAIN NATRIURETIC PEPTIDE: B NATRIURETIC PEPTIDE 5: 177.4 pg/mL — AB (ref 0.0–100.0)

## 2017-11-11 LAB — TSH: TSH: 0.915 u[IU]/mL (ref 0.350–4.500)

## 2017-11-11 MED ORDER — ADULT MULTIVITAMIN W/MINERALS CH
1.0000 | ORAL_TABLET | Freq: Every day | ORAL | Status: DC
  Administered 2017-11-13 – 2017-11-16 (×4): 1 via ORAL
  Filled 2017-11-11 (×4): qty 1

## 2017-11-11 MED ORDER — ATORVASTATIN CALCIUM 40 MG PO TABS
40.0000 mg | ORAL_TABLET | Freq: Every day | ORAL | Status: DC
  Administered 2017-11-12 – 2017-11-14 (×3): 40 mg via ORAL
  Filled 2017-11-11 (×4): qty 1

## 2017-11-11 MED ORDER — PANTOPRAZOLE SODIUM 40 MG PO TBEC
40.0000 mg | DELAYED_RELEASE_TABLET | Freq: Every day | ORAL | Status: DC
  Administered 2017-11-12 – 2017-11-15 (×4): 40 mg via ORAL
  Filled 2017-11-11 (×4): qty 1

## 2017-11-11 MED ORDER — DILTIAZEM HCL 25 MG/5ML IV SOLN
10.0000 mg | Freq: Once | INTRAVENOUS | Status: AC
  Administered 2017-11-11: 10 mg via INTRAVENOUS
  Filled 2017-11-11: qty 5

## 2017-11-11 MED ORDER — SODIUM CHLORIDE 0.9% FLUSH
3.0000 mL | Freq: Two times a day (BID) | INTRAVENOUS | Status: DC
  Administered 2017-11-12 – 2017-11-15 (×5): 3 mL via INTRAVENOUS

## 2017-11-11 MED ORDER — DILTIAZEM HCL-DEXTROSE 100-5 MG/100ML-% IV SOLN (PREMIX)
5.0000 mg/h | Freq: Once | INTRAVENOUS | Status: AC
  Administered 2017-11-11: 5 mg/h via INTRAVENOUS
  Filled 2017-11-11: qty 100

## 2017-11-11 MED ORDER — DILTIAZEM HCL-DEXTROSE 100-5 MG/100ML-% IV SOLN (PREMIX)
5.0000 mg/h | INTRAVENOUS | Status: DC
Start: 2017-11-11 — End: 2017-11-12
  Administered 2017-11-12: 15 mg/h via INTRAVENOUS
  Filled 2017-11-11 (×3): qty 100

## 2017-11-11 MED ORDER — METOPROLOL TARTRATE 25 MG PO TABS: 50.0000 mg | ORAL_TABLET | Freq: Two times a day (BID) | ORAL | Status: DC

## 2017-11-11 MED ORDER — DILTIAZEM HCL 25 MG/5ML IV SOLN
5.0000 mg | Freq: Once | INTRAVENOUS | Status: AC
  Administered 2017-11-11: 5 mg via INTRAVENOUS
  Filled 2017-11-11: qty 5

## 2017-11-11 MED ORDER — SODIUM CHLORIDE 0.9 % IV BOLUS (SEPSIS)
500.0000 mL | Freq: Once | INTRAVENOUS | Status: AC
  Administered 2017-11-11: 500 mL via INTRAVENOUS

## 2017-11-11 MED ORDER — DOCUSATE SODIUM 100 MG PO CAPS
100.0000 mg | ORAL_CAPSULE | Freq: Two times a day (BID) | ORAL | Status: DC
  Administered 2017-11-12 – 2017-11-16 (×7): 100 mg via ORAL
  Filled 2017-11-11 (×8): qty 1

## 2017-11-11 MED ORDER — LACTATED RINGERS IV BOLUS (SEPSIS)
500.0000 mL | Freq: Once | INTRAVENOUS | Status: AC
  Administered 2017-11-12: 500 mL via INTRAVENOUS

## 2017-11-11 MED ORDER — VITAMIN D 1000 UNITS PO TABS
1000.0000 [IU] | ORAL_TABLET | Freq: Every day | ORAL | Status: DC
  Administered 2017-11-13 – 2017-11-16 (×4): 1000 [IU] via ORAL
  Filled 2017-11-11 (×4): qty 1

## 2017-11-11 MED ORDER — ENOXAPARIN SODIUM 40 MG/0.4ML ~~LOC~~ SOLN
40.0000 mg | Freq: Every day | SUBCUTANEOUS | Status: DC
  Filled 2017-11-11: qty 0.4

## 2017-11-11 MED ORDER — LACTATED RINGERS IV SOLN
INTRAVENOUS | Status: DC
Start: 2017-11-11 — End: 2017-11-12

## 2017-11-11 MED ORDER — DILTIAZEM HCL 60 MG PO TABS
60.0000 mg | ORAL_TABLET | Freq: Once | ORAL | Status: DC
  Filled 2017-11-11: qty 1

## 2017-11-11 NOTE — ED Triage Notes (Signed)
Pt fell out of bed in nursing home facility. Pt has hx of CVA, right side paralysis. First Ems stated pt had right shoulder deformity and bruising to right side. No deformity or bruising witnessed by second Ems unit. Pt sinus tachy, pt able to follow commands. Hx of CHF. fluid bolus given by EMS.

## 2017-11-11 NOTE — ED Notes (Signed)
Pt difficult stick, lab also unable to draw labs for blood tube. Iv team consult ordered.

## 2017-11-11 NOTE — ED Provider Notes (Signed)
[facility] EMERGENCY DEPARTMENT Provider Note   CSN: [id number] Arrival date & time: 11/11/17  1942     History   Chief Complaint Chief Complaint  Patient presents with  . Fall    HPI [name] is a 84 y.o. male.  The history is provided by the patient and a relative.  Fall  This is a new problem. The current episode started less than 1 hour ago. Pertinent negatives include no chest pain, no abdominal pain, no headaches and no shortness of breath.  -Patient was recently discharged after being treated for a hemorrhagic stroke with resultant right-sided hemiparesis and dysarthria and facial droop.  Patient was try to get of bed when he fell to the floor.  He states he hit his head.  He denies any pain anywhere.  Nursing home reports right shoulder injury. -EMS also notes that heart rate was up in the 130s.  Patient does have history of atrial fibrillation.  Past Medical History:  Diagnosis Date  . Atrial fibrillation (HCC)   . Atrial flutter (HCC)   . Atrial flutter (HCC)   . Edema 03/17/2009   Qualifier: Diagnosis of  By: Flonnie Overman    . GERD (gastroesophageal reflux disease)   . HTN (hypertension)   . Hypercholesteremia   . Hyperlipidemia   . Stroke [facility])     Patient Active Problem List   Diagnosis Date Noted  . Dysphagia following intracerebral hemorrhage 11/07/2017  . Right hemiplegia (HCC) d/t ICH 11/07/2017  . CHF (congestive heart failure) (HCC)   . Atrial flutter with rapid ventricular response (HCC)   . Leukocytosis   . L [place] ICH (intracerebral hemorrhage) (HCC) 10/27/2017  . Chronic anticoagulation 11/19/2016  . Right bundle branch block 09/08/2014  . Encounter for therapeutic drug monitoring 11/24/2013  . Atrial fibrillation with RVR (HCC) 06/30/2013  . Weight loss 06/03/2013    Class: Chronic  . Hyperlipidemia   . Essential hypertension 03/17/2009  . Atrial flutter (HCC) 03/17/2009  . Other specified cardiac  dysrhythmias(427.89) 03/17/2009  . EDEMA 03/17/2009    No past surgical history on file.     Home Medications    Prior to Admission medications   Medication Sig Start Date End Date Taking? Authorizing Provider  atorvastatin (LIPITOR) 40 MG tablet Take 1 tablet (40 mg total) by mouth daily at 6 PM. 11/07/17   Layne Benton, NP  Calcium Carbonate-Vitamin D (CALCIUM 600+D3 PO) Take by mouth. TAKE ONE TABLET DAILY    [provider]  cholecalciferol (VITAMIN D) 1000 UNITS tablet Take 1,000 Units by mouth daily.    [provider]  diltiazem (CARDIZEM) 60 MG tablet Take 1 tablet (60 mg total) by mouth every 6 (six) hours. 11/07/17   Layne Benton, NP  docusate sodium (COLACE) 100 MG capsule Take 1 capsule (100 mg total) by mouth 2 (two) times daily. 11/07/17 11/07/18  Layne Benton, NP  feeding supplement, ENSURE ENLIVE, (ENSURE ENLIVE) LIQD Take 237 mLs by mouth 3 (three) times daily between meals. 11/07/17   Layne Benton, NP  Maltodextrin-Xanthan Gum (RESOURCE THICKENUP CLEAR) POWD Take 120 g by mouth as needed (for nectar thick liquids, adjust thickness per SLP recommendation). 11/07/17   Layne Benton, NP  metoprolol tartrate (LOPRESSOR) 50 MG tablet Take 1 tablet (50 mg total) by mouth 2 (two) times daily. 11/07/17   Layne Benton, NP  Multiple Vitamins-Minerals (MULTIVITAMIN WITH MINERALS) tablet Take 1 tablet by mouth daily.  [provider]  pantoprazole (PROTONIX) 40 MG tablet Take 1 tablet (40 mg total) by mouth at bedtime. 11/07/17   Layne Benton, NP  QUEtiapine (SEROQUEL) 25 MG tablet Take 0.5 tablets (12.5 mg total) by mouth 2 (two) times daily. 11/07/17   Layne Benton, NP    Family History Family History  Problem Relation Age of Onset  . Healthy Mother   . Other Father        "too much iron in blood"  . Healthy Sister     Social History Social History   Tobacco Use  . Smoking status: Never Smoker  . Smokeless tobacco: Never Used    Substance Use Topics  . Alcohol use: No    Alcohol/week: 0.0 oz  . Drug use: No     Allergies   Patient has no known allergies.   Review of Systems Review of Systems  Constitutional: Negative for chills and fever.  HENT: Negative for sore throat.   Eyes: Negative for visual disturbance.  Respiratory: Negative for shortness of breath.   Cardiovascular: Negative for chest pain and palpitations.  Gastrointestinal: Negative for abdominal pain, nausea and vomiting.  Genitourinary: Negative for dysuria and hematuria.  Musculoskeletal: Negative for back pain and neck pain.  Skin: Negative for rash.  Neurological: Positive for facial asymmetry (s/p stroke), speech difficulty (s/p stroke) and weakness (R sided hemiparesis s/p stroke). Negative for syncope and headaches.  All other systems reviewed and are negative.    Physical Exam Updated Vital Signs BP (!) 139/94   Pulse (!) 138   Resp 20   SpO2 97%   Physical Exam  Constitutional: He appears well-developed and well-nourished.  HENT:  Head: Normocephalic. Head is with contusion.    Eyes: Conjunctivae are normal. Pupils are equal, round, and reactive to light.  Neck: Neck supple.  Cardiovascular: Intact distal pulses. An irregularly irregular rhythm present. Tachycardia present.  Murmur heard.  Systolic murmur is present with a grade of 2/6. Pulmonary/Chest: Effort normal and breath sounds normal. No respiratory distress. He has no wheezes. He has no rales.  Abdominal: Soft. He exhibits no distension. There is no tenderness. There is no guarding.  Musculoskeletal: He exhibits no edema.  Neurological: He is alert.  Right-sided hemiparesis.  Patient is able to slightly wiggle his right fingers.  5 out of 5 strength on the left side.  Dysarthria and facial droop.  Skin: Skin is warm and dry.  Psychiatric: He has a normal mood and affect.  Nursing note and vitals reviewed.    ED Treatments / Results  Labs (all labs  ordered are listed, but only abnormal results are displayed) Labs Reviewed  CBC - Abnormal; Notable for the following components:      Result Value   WBC 14.2 (*)    Hemoglobin 17.5 (*)    MCH 35.2 (*)    All other components within normal limits  T4, FREE - Abnormal; Notable for the following components:   Free T4 1.28 (*)    All other components within normal limits  BRAIN NATRIURETIC PEPTIDE - Abnormal; Notable for the following components:   B Natriuretic Peptide 177.4 (*)    All other components within normal limits  BASIC METABOLIC PANEL - Abnormal; Notable for the following components:   CO2 20 (*)    GFR calc non Af Amer 54 (*)    All other components within normal limits  TSH  MAGNESIUM  PROTIME-INR  I-STAT TROPONIN, ED  EKG  EKG Interpretation None       Radiology Ct Head Wo Contrast  Result Date: 11/11/2017 CLINICAL DATA:  Patient fell out of bed. History of infarct and right-sided paralysis. Head trauma. EXAM: CT HEAD WITHOUT CONTRAST CT CERVICAL SPINE WITHOUT CONTRAST TECHNIQUE: Multidetector CT imaging of the head and cervical spine was performed following the standard protocol without intravenous contrast. Multiplanar CT image reconstructions of the cervical spine were also generated. COMPARISON:  10/27/2017 head CT. FINDINGS: CT HEAD FINDINGS Brain: Chronic left corona radiata lacunar infarct with moderate small vessel ischemic disease and cerebral near complete resolution of previously noted left pontine infarct with only a small focus of residual hyperdensity currently identified measuring approximately 8 x 2 mm. No hydrocephalus. Midline fourth ventricle and basal cisterns without effacement. Vascular: Calcific atherosclerosis of the carotid siphons and both vertebral arteries. No hyperdense vessel sign. Skull: No acute osseous abnormality. Sinuses/Orbits: Intact orbits and globes. Clear paranasal sinuses and mastoids. Other: None CT CERVICAL SPINE FINDINGS  Alignment: Maintained cervical lordosis. Intact craniocervical relationship. Osteoarthritis of atlantodental with joint space narrowing and sclerosis. Skull base and vertebrae: No fracture of the skull base. No vertebral fracture. Listhesis. Soft tissues and spinal canal: No prevertebral fluid or swelling. No visible canal hematoma. Disc levels: No central canal stenosis or focal disc herniations. There is mild disc space narrowing at C3-4 with moderate to marked disc space narrowing at C6-7. Small posterior marginal osteophytes are seen at C6-7 minimal neural foraminal narrowing bilaterally. Multilevel degenerative facet arthropathy with ankylosed appearance of the C4-5 facets bilaterally. Bilateral C3-4 uncovertebral joint osteoarthritis with uncinate spurring. Upper chest: Limited by respiratory motion. No dominant mass. Scarring at the right lung apex. Bilateral upper lobe hazy opacities right greater left may represent passive congestion, alveolitis or pneumonitis among some possibilities. Other: None IMPRESSION: 1. Near complete resolution previously noted left pontine hemorrhage now measuring approximately 8 x 2 mm in transverse by AP dimension. 2. Atrophy with moderate chronic small vessel ischemia, stable in appearance with chronic left corona radiata lacunar infarct. 3. No acute cervical spine fracture. Degenerative disc disease at C3-4 and C6-7 with multilevel degenerative facet arthropathy and ankylosed appearance of the facets at C4-5 bilaterally. Electronically Signed   By: Tollie Eth M.D.   On: 11/11/2017 21:57   Ct Cervical Spine Wo Contrast  Result Date: 11/11/2017 CLINICAL DATA:  Patient fell out of bed. History of infarct and right-sided paralysis. Head trauma. EXAM: CT HEAD WITHOUT CONTRAST CT CERVICAL SPINE WITHOUT CONTRAST TECHNIQUE: Multidetector CT imaging of the head and cervical spine was performed following the standard protocol without intravenous contrast. Multiplanar CT image  reconstructions of the cervical spine were also generated. COMPARISON:  10/27/2017 head CT. FINDINGS: CT HEAD FINDINGS Brain: Chronic left corona radiata lacunar infarct with moderate small vessel ischemic disease and cerebral near complete resolution of previously noted left pontine infarct with only a small focus of residual hyperdensity currently identified measuring approximately 8 x 2 mm. No hydrocephalus. Midline fourth ventricle and basal cisterns without effacement. Vascular: Calcific atherosclerosis of the carotid siphons and both vertebral arteries. No hyperdense vessel sign. Skull: No acute osseous abnormality. Sinuses/Orbits: Intact orbits and globes. Clear paranasal sinuses and mastoids. Other: None CT CERVICAL SPINE FINDINGS Alignment: Maintained cervical lordosis. Intact craniocervical relationship. Osteoarthritis of atlantodental with joint space narrowing and sclerosis. Skull base and vertebrae: No fracture of the skull base. No vertebral fracture. Listhesis. Soft tissues and spinal canal: No prevertebral fluid or swelling. No visible canal hematoma. Disc  levels: No central canal stenosis or focal disc herniations. There is mild disc space narrowing at C3-4 with moderate to marked disc space narrowing at C6-7. Small posterior marginal osteophytes are seen at C6-7 minimal neural foraminal narrowing bilaterally. Multilevel degenerative facet arthropathy with ankylosed appearance of the C4-5 facets bilaterally. Bilateral C3-4 uncovertebral joint osteoarthritis with uncinate spurring. Upper chest: Limited by respiratory motion. No dominant mass. Scarring at the right lung apex. Bilateral upper lobe hazy opacities right greater left may represent passive congestion, alveolitis or pneumonitis among some possibilities. Other: None IMPRESSION: 1. Near complete resolution previously noted left pontine hemorrhage now measuring approximately 8 x 2 mm in transverse by AP dimension. 2. Atrophy with moderate  chronic small vessel ischemia, stable in appearance with chronic left corona radiata lacunar infarct. 3. No acute cervical spine fracture. Degenerative disc disease at C3-4 and C6-7 with multilevel degenerative facet arthropathy and ankylosed appearance of the facets at C4-5 bilaterally. Electronically Signed   By: Tollie Eth M.D.   On: 11/11/2017 21:57   Dg Chest Portable 1 View  Result Date: 11/11/2017 CLINICAL DATA:  Pain following fall EXAM: PORTABLE CHEST 1 VIEW COMPARISON:  November 05, 2017 FINDINGS: Lungs are clear. Heart is mildly enlarged with pulmonary vascularity within normal limits. No adenopathy. There is aortic atherosclerosis. There is evidence of old fracture of the left clavicle with remodeling. IMPRESSION: Stable cardiomegaly. No edema or consolidation. There is aortic atherosclerosis. Aortic Atherosclerosis (ICD10-I70.0). Electronically Signed   By: Bretta Bang III M.D.   On: 11/11/2017 21:19    Procedures Procedures (including critical care time)  Medications Ordered in ED Medications  sodium chloride 0.9 % bolus 500 mL (0 mLs Intravenous Stopped 11/11/17 2050)  diltiazem (CARDIZEM) injection 5 mg (5 mg Intravenous Given 11/11/17 0500)  diltiazem (CARDIZEM) injection 10 mg (10 mg Intravenous Given 11/11/17 2146)  diltiazem (CARDIZEM) 100 mg in dextrose 5% (1 mg/mL) infusion (15 mg/hr Intravenous Rate/Dose Change 11/11/17 2307)     Initial Impression / Assessment and Plan / ED Course  I have reviewed the triage vital signs and the nursing notes.  Pertinent labs & imaging results that were available during my care of the patient were reviewed by me and considered in my medical decision making (see chart for details).     Patient is a 84 year old male with history of atrial fibrillation, GERD, hypertension, hyperlipidemia, recent hemorrhagic stroke with right-sided hemiparesis, dysarthria and facial droop who presents after mechanical fall.  Patient was sitting up  from bed when he was not supposed to he fell to the ground.  Patient states he did hit his head.  He had no loss of consciousness and denies having any other pain.  On exam he has a mild contusion to the top of his forehead but no other signs of injury.  He has no spinal tenderness.  He does have a bruise over his right shoulder.  Patient is tachycardic in the 130s on the monitor ambulation and on EKG.  Patient does have a history of atrial fibrillation with RVR.  Patient is supposed to take diltiazem at home.  CT head and cervical spine were unremarkable for any acute injury.  Chest x-ray without any acute findings.  Patient does not have any tenderness over the right shoulder and a bruise later developed during his ED stay so right shoulder film was added on as well.  Labs as above showed a negative troponin, BNP of 177, mild leukocytosis but no signs of infection, unremarkable  BNP.  Patient was given diltiazem IV injection 5 mg followed by 10 mg with minimal improvement in heart rate.  Patient started on a diltiazem infusion and is up titrated to a rate of 10 but still with no improvement.  Informed the nurse to continue going up on the diltiazem rate for better rate control.  Hospitalist will admit the patient for atrial fibrillation with RVR.    Final Clinical Impressions(s) / ED Diagnoses   Final diagnoses:  Fall, initial encounter  Atrial fibrillation with RVR [facility](HCC)    ED Discharge Orders    None       Dwana MelenaDong, [place]ez [place], DO 11/11/17 2320    Mesner, Barbara CowerJason, MD 11/11/17 [phone]

## 2017-11-11 NOTE — ED Notes (Signed)
Patient transported to CT 

## 2017-11-11 NOTE — H&P (Addendum)
History and Physical   JOURDYN FERRIN WUJ:[id number] DOB: 04/01/1930 DOA: 11/11/2017  PCP: Patient, No Pcp Per  Chief Complaint: fall  HPI: this is an 84 year old man with medical problems including atrial fibrillation and hypertension and recent hemorrhagic CVA of the left pons with resultant right sided facial droop, expressive dysarthria, dysphagia requiring dysphagia nectar thick liquid liquid diet, and right sided hemiparesis who was discharged to skilled nursing facility to optimize rehabilitation efforts on 11/07/2017. He suffered and observe mechanical fall the day of admission, due to concerns about trauma he was transported to [facility].  History is limited due to the patient's underlying dysarthria, he is able to deny being any pain, having any chest pain, or shortness of breath.  His daughter is reachable by phone and reports that he has had challenges with his thickened liquid diet, sites that his by mouth intake has been very poor. She relays that he formerly worked at a garden center office, before his stroke earlier this month he was living independently alone, frequently working outdoors in his garden. She relays that his mother died of aortic aneurysm and that his father died of complications of lead poisoning.  Upon review of cardiac monitoring tracings from EMS, appears to my a flutter with 2 to one conduction, heart rate of 150.  ED Course: in emergency department vital signs remarkable for tachycardia with heart rate ranging from 120s to 160, systolic blood pressure over 100. CBC reveals elevated hemoglobin of 17.5, and leukocytosis to 14.2.  Creatinine of 1.2. BNP 177. Troponin undetectable.  EKG reveals tachycardia arrhythmia with rate in the 130s.  CT imaging of the cervical spine and head revealed near complete resolution of prior left pontine hemorrhage, as well as atrophy with moderate chronic small vessel ischemia stable in appearance. No acute cervical spine  fracture. Chest x-ray without acute findings.  Patient was given 500 mL of normal saline, as well as IV diltiazem 2 without improvement in the heart rate, therefore was placed on a diltiazem drip.  Hospital medicine consulted for further management.  Review of Systems: A complete ROS was obtained; pertinent positives negatives are denoted in the HPI. Otherwise, all systems are negative.   Past Medical History:  Diagnosis Date  . Atrial fibrillation (HCC)   . Atrial flutter (HCC)   . Atrial flutter (HCC)   . Edema 03/17/2009   Qualifier: Diagnosis of  By: Flonnie Overman    . GERD (gastroesophageal reflux disease)   . HTN (hypertension)   . Hypercholesteremia   . Hyperlipidemia   . Stroke [facility])    Social History   Socioeconomic History  . Marital status: Married    Spouse name: Not on file  . Number of children: Not on file  . Years of education: Not on file  . Highest education level: Not on file  Social Needs  . Financial resource strain: Not on file  . Food insecurity - worry: Not on file  . Food insecurity - inability: Not on file  . Transportation needs - medical: Not on file  . Transportation needs - non-medical: Not on file  Occupational History  . Not on file  Tobacco Use  . Smoking status: Never Smoker  . Smokeless tobacco: Never Used  Substance and Sexual Activity  . Alcohol use: No    Alcohol/week: 0.0 oz  . Drug use: No  . Sexual activity: Yes  Other Topics Concern  . Not on file  Social History Narrative  . Not  on file   Family History  Problem Relation Age of Onset  . Healthy Mother   . Other Father        "too much iron in blood"  . Healthy Sister     Physical Exam: Vitals:   11/11/17 2215 11/11/17 2245 11/11/17 2300 11/11/17 2312  BP: (!) 129/96 (!) 139/94 (!) 117/94   Pulse: (!) 136 (!) 138    Resp: 19 20 (!) 22   SpO2: 94% 94%  97%   General: Appears calm and comfortable, elderly white man ENT: Grossly normal hearing, extremely dry mucus  membranes Cardiovascular: Tachycardic rate ~130s. No M/R/G. No LE edema.  Respiratory: CTA bilaterally. No wheezes or crackles. Normal respiratory effort. Abdomen: Soft, non-tender. No rebound or guarding. Skin: No rash or induration seen on limited exam. Musculoskeletal: Grossly normal tone BUE/BLE.  Psychiatric: Grossly normal mood and affect. Neurologic: Pupils bilaterally round and reactive to light, right side facial droop present, left side gaze preference, right hand finger grasp weaker than left hand finger grasp; able to lift left heel off bed, unable to lift right heel off bed.  Right side hemiparesis present.    I have personally reviewed the following labs, culture data, and imaging studies.  Assessment/Plan:  #Atrial flutter / fibrillation with rapid ventricular response #Dehydration Course: AFL with 2:1 conduction present based on records accompanying patient; unable to achieve satisfactory rate control with IV diltiazem x 2, placed on diltiazem gtt. Possible precipitant includes - dehydration. TSH normal on admission. Assessment: currently is not hemodynamically unstable Plan: Will continue titratable dilt gtt with plans to transition back to oral dilt once rate controlled  Continue home BB  500 cc additional bolus of LR and then start on 100 cc q h x10 hrs to hydrate Checking INR, goal INR of 2-3 for stroke prevention in AF (although his prior CVA was hemorrhagic)  #Other problems -Recent hemorrhagic left pontine CVA: occurred early 10/2017, was discharged on warfarin; patient with persistent dysarthria and dysphagia, right sided hemiparesis; continue secondary CVA efforts, was not discharged on ASA; continue statin therapy -Hx of HTN: was taken off ARB and HCTZ upon discharge due to hypotension -Dysphagia: continue dysphagia diet with nectar thick liquids; may need feeding tube for nutrition and hydration if he is unable to take in adequate po intake  DVT prophylaxis: Subq  Lovenox until INR deemed therapeutic Code Status: DNR after discussion on admission Disposition Plan: Anticipate D/C to SNF in 2-5 days Consults called: none currently, anticipate need for PT / OT / ST Admission status: step-down unit for titratable dilt gtt, hospital medicine   Laurell RoofPatrick Kuhlman, MD [facility] [phone]  If 7PM-7AM, please contact night-coverage [email or web address] Password [email or web address]   Addendum: Nursing called to notify me of patient agitation; I went to the bedside - he appears somewhat agitated, requires redirection; difficult to know full extent 2/2 to his dysarthria; his seroquel was re-ordered for BID, if unable to give PO medications or for breakthrough agitation, lorazepam IV ordered.  Elder LovePatrick D Kuhlman, MD

## 2017-11-12 DIAGNOSIS — E860 Dehydration: Secondary | ICD-10-CM

## 2017-11-12 DIAGNOSIS — I484 Atypical atrial flutter: Principal | ICD-10-CM

## 2017-11-12 LAB — COMPREHENSIVE METABOLIC PANEL
ALT: 41 U/L (ref 17–63)
ANION GAP: 12 (ref 5–15)
AST: 40 U/L (ref 15–41)
Albumin: 2.9 g/dL — ABNORMAL LOW (ref 3.5–5.0)
Alkaline Phosphatase: 135 U/L — ABNORMAL HIGH (ref 38–126)
BILIRUBIN TOTAL: 1.8 mg/dL — AB (ref 0.3–1.2)
BUN: 18 mg/dL (ref 6–20)
CHLORIDE: 110 mmol/L (ref 101–111)
CO2: 21 mmol/L — ABNORMAL LOW (ref 22–32)
Calcium: 8.9 mg/dL (ref 8.9–10.3)
Creatinine, Ser: 1.2 mg/dL (ref 0.61–1.24)
GFR, EST NON AFRICAN AMERICAN: 53 mL/min — AB (ref >60)
Glucose, Bld: 109 mg/dL — ABNORMAL HIGH (ref 65–99)
POTASSIUM: 3.6 mmol/L (ref 3.5–5.1)
Sodium: 143 mmol/L (ref 135–145)
Total Protein: 6.3 g/dL — ABNORMAL LOW (ref 6.5–8.1)

## 2017-11-12 LAB — CBC
HEMATOCRIT: 45.4 % (ref 39.0–52.0)
HEMOGLOBIN: 15.7 g/dL (ref 13.0–17.0)
MCH: 34.7 pg — ABNORMAL HIGH (ref 26.0–34.0)
MCHC: 34.6 g/dL (ref 30.0–36.0)
MCV: 100.2 fL — AB (ref 78.0–100.0)
Platelets: 219 10*3/uL (ref 150–400)
RBC: 4.53 MIL/uL (ref 4.22–5.81)
RDW: 13.2 % (ref 11.5–15.5)
WBC: 12.8 10*3/uL — AB (ref 4.0–10.5)

## 2017-11-12 LAB — PROTIME-INR
INR: 1.3
Prothrombin Time: 16 seconds — ABNORMAL HIGH (ref 11.4–15.2)

## 2017-11-12 MED ORDER — LACTATED RINGERS IV BOLUS (SEPSIS)
500.0000 mL | Freq: Once | INTRAVENOUS | Status: AC
  Administered 2017-11-12: 500 mL via INTRAVENOUS

## 2017-11-12 MED ORDER — METOPROLOL TARTRATE 5 MG/5ML IV SOLN
INTRAVENOUS | Status: AC
  Filled 2017-11-12: qty 5

## 2017-11-12 MED ORDER — METOPROLOL TARTRATE 50 MG PO TABS
50.0000 mg | ORAL_TABLET | Freq: Two times a day (BID) | ORAL | Status: DC
  Administered 2017-11-12 – 2017-11-16 (×8): 50 mg via ORAL
  Filled 2017-11-12 (×6): qty 1
  Filled 2017-11-12: qty 2
  Filled 2017-11-12: qty 1

## 2017-11-12 MED ORDER — DILTIAZEM HCL-DEXTROSE 100-5 MG/100ML-% IV SOLN (PREMIX): 5.0000 mg/h | INTRAVENOUS | Status: AC

## 2017-11-12 MED ORDER — QUETIAPINE FUMARATE 25 MG PO TABS
12.5000 mg | ORAL_TABLET | Freq: Two times a day (BID) | ORAL | Status: DC
  Administered 2017-11-12 – 2017-11-16 (×8): 12.5 mg via ORAL
  Filled 2017-11-12 (×10): qty 1

## 2017-11-12 MED ORDER — DILTIAZEM HCL 25 MG/5ML IV SOLN
10.0000 mg | Freq: Once | INTRAVENOUS | Status: AC
  Administered 2017-11-12: 10 mg via INTRAVENOUS
  Filled 2017-11-12: qty 5

## 2017-11-12 MED ORDER — RESOURCE THICKENUP CLEAR PO POWD
ORAL | Status: DC | PRN
  Filled 2017-11-12: qty 125

## 2017-11-12 MED ORDER — LORAZEPAM 2 MG/ML IJ SOLN
0.5000 mg | INTRAMUSCULAR | Status: DC | PRN
  Administered 2017-11-12: 0.5 mg via INTRAVENOUS
  Filled 2017-11-12: qty 1

## 2017-11-12 MED ORDER — METOPROLOL TARTRATE 5 MG/5ML IV SOLN
5.0000 mg | Freq: Four times a day (QID) | INTRAVENOUS | Status: DC
  Administered 2017-11-12 (×2): 5 mg via INTRAVENOUS
  Filled 2017-11-12: qty 5

## 2017-11-12 MED ORDER — ENOXAPARIN SODIUM 40 MG/0.4ML ~~LOC~~ SOLN
40.0000 mg | Freq: Every day | SUBCUTANEOUS | Status: DC
  Administered 2017-11-12 – 2017-11-15 (×4): 40 mg via SUBCUTANEOUS
  Filled 2017-11-12 (×4): qty 0.4

## 2017-11-12 MED ORDER — DILTIAZEM HCL 60 MG PO TABS
90.0000 mg | ORAL_TABLET | Freq: Three times a day (TID) | ORAL | Status: DC
  Administered 2017-11-12 – 2017-11-16 (×13): 90 mg via ORAL
  Filled 2017-11-12 (×14): qty 2

## 2017-11-12 NOTE — ED Notes (Signed)
Spoke with provider Dr. Rema Jasmine, made aware of pt.s status HR 137, BP 91/66. Provider stated give patient fluid bolus of  LR and HR below 150 is suitable.

## 2017-11-12 NOTE — Progress Notes (Signed)
[facility] TEAM 1 - Stepdown/ICU TEAM  Marthe PatchCarlton R Neidlinger  ZOX:[id number] DOB: 10/23/1929 DOA: 11/11/2017 PCP: Patient, No Pcp Per    Brief Narrative:  84 year old man with a hx of atrial fibrillation, HTN, and recent hemorrhagic CVA of the left pons (resultant right sided facial droop, expressive dysarthria, dysphagia requiring nectar thick liquids, and right sided hemiparesis) who was discharged to a SNF on 11/07/2017. He suffered a mechanical fall 11/11/17, and was brought to the ED for evaluation.  In the ED he was found to be tachycardic with heart rate ranging from 120 > 160. CTof the cervical spine and head revealed near complete resolution of prior left pontine hemorrhage, as well as atrophy with moderate chronic small vessel ischemia stable in appearance. No acute cervical spine fracture. Chest x-ray without acute findings.  Patient was given 500 mL of normal saline, as well as IV diltiazem 2 without improvement in the heart rate, therefore was placed on a diltiazem drip.    Significant Events: 3/17 admit   Subjective: Pt states he is very thirsty, and is asking for water repeatedly.  He denies cp, sob, or abdom pain.    Assessment & Plan:  Atrial flutter / fibrillation with RVR Likely driven by volume depletion - BB added to cardizem gtt - rate much better now - Cards consulted at request of family   Dehydration Appears to simply be due to poor intake in setting of recently modified diet - volume expand and follow   Large L pons hemorrhagic CVA 10/27/2017 Discharged 11/07/17 - no longer on warfarin   Chronic diastolic CHF  Follow w/ volume expansion   HTN was taken off ARB and HCTZ upon discharge due to hypotension  Dysphagia continue dysphagia diet with nectar thick liquids; may need feeding tube for nutrition and hydration if he is unable to take in adequate po intake  HLD   DVT prophylaxis: lovenox  Code Status: FULL CODE Family Communication: no family present at  time of exam  Disposition Plan:   Consultants:  Cardiology   Antimicrobials:  none   Objective: Blood pressure 129/85, pulse 74, temperature (!) 97.5 F (36.4 C), temperature source Axillary, resp. rate 18, height 5\' 10"  (1.778 m), weight 70.7 kg (155 lb 13.8 oz), SpO2 98 %.  Intake/Output Summary (Last 24 hours) at 11/12/2017 1615 Last data filed at 11/11/2017 2050 Gross per 24 hour  Intake 500 ml  Output -  Net 500 ml   Filed Weights   11/12/17 1055  Weight: 70.7 kg (155 lb 13.8 oz)    Examination: General: No acute respiratory distress Lungs: Clear to auscultation bilaterally without wheezes or crackles Cardiovascular: Regular rate - irreg - no M or rub  Abdomen: Nontender, nondistended, soft, bowel sounds positive, no rebound, no ascites, no appreciable mass Extremities: No significant cyanosis, clubbing, or edema bilateral lower extremities  CBC: Recent Labs  Lab 11/06/17 0356 11/07/17 0820 11/11/17 2159 11/12/17 0509  WBC 11.7* 11.1* 14.2* 12.8*  HGB 15.1 15.0 17.5* 15.7  HCT 43.0 44.6 49.3 45.4  MCV 99.8 101.4* 99.2 100.2*  PLT 221 229 230 219   Basic Metabolic Panel: Recent Labs  Lab 11/06/17 0356 11/07/17 0820 11/11/17 2100 11/12/17 0509  NA 140 141 141 143  K 3.8 3.6 3.7 3.6  CL 109 110 106 110  CO2 22 21* 20* 21*  GLUCOSE 123* 109* 98 109*  BUN 24* 26* 20 18  CREATININE 1.05 1.09 1.19 1.20  CALCIUM 8.8* 8.6* 9.1 8.9  MG  --   --  2.0  --    GFR: Estimated Creatinine Clearance: 45 mL/min (by C-G formula based on SCr of 1.2 mg/dL).  Liver Function Tests: Recent Labs  Lab 11/12/17 0509  AST 40  ALT 41  ALKPHOS 135*  BILITOT 1.8*  PROT 6.3*  ALBUMIN 2.9*    Coagulation Profile: Recent Labs  Lab 11/12/17 0509  INR 1.30    HbA1C: Hgb A1c MFr Bld  Date/Time Value Ref Range Status  10/27/2017 12:40 PM 5.8 (H) 4.8 - 5.6 % Final    Comment:    (NOTE) Pre diabetes:          5.7%-6.4% Diabetes:              >6.4% Glycemic  control for   <7.0% adults with diabetes     CBG: Recent Labs  Lab 11/05/17 2021 11/06/17 1640 11/06/17 2207 11/07/17 0704 11/07/17 1610  GLUCAP 135* 105* 150* 109* 97     Scheduled Meds: . atorvastatin  40 mg Oral q1800  . cholecalciferol  1,000 Units Oral Daily  . diltiazem  90 mg Oral Q8H  . docusate sodium  100 mg Oral BID  . enoxaparin (LOVENOX) injection  40 mg Subcutaneous Daily  . metoprolol tartrate      . metoprolol tartrate  50 mg Oral BID  . multivitamin with minerals  1 tablet Oral Daily  . pantoprazole  40 mg Oral QHS  . QUEtiapine  12.5 mg Oral BID  . sodium chloride flush  3 mL Intravenous Q12H   Continuous Infusions: . diltiazem (CARDIZEM) infusion       LOS: 1 day   Lonia Blood, MD [facility] Office  [phone] Pager - Text Page per Loretha Stapler as per below:  On-Call/Text Page:      [email or web address]      password [email or web address]  If 7PM-7AM, please contact night-coverage [email or web address] Password [facility] 11/12/2017, 4:15 PM

## 2017-11-12 NOTE — ED Notes (Signed)
Lung sounds remained clear bilaterally with lower diminshed breath sounds after fluid bolus.

## 2017-11-12 NOTE — ED Notes (Signed)
GCS 14. Pt has hx of CVA. Right side paralysis. PT currently agitated . Dr. Erenest RasherKulhman made aware and ordered meds.

## 2017-11-12 NOTE — ED Notes (Signed)
Provider paged through amnion. HR remains elevated on Cardizem drip.

## 2017-11-12 NOTE — ED Notes (Signed)
Page sent to Dr. Sharon SellerMcClung regarding pulse ranging from 130s-210s. No distress noted. Patient sleeping.

## 2017-11-12 NOTE — Consult Note (Addendum)
Cardiology Consultation:   Patient ID: [name]; [id number]; 08/14/1930   Admit date: 11/11/2017 Date of Consult: 11/12/2017  Primary Care Provider: Patient, No Pcp Per Primary Cardiologist: Lesleigh Noe, MD  Primary Electrophysiologist:     Patient Profile:   [name] is a 84 y.o. male with a hx of Afib/flutter, HTN, chronic diastolic heart failure, HLD, and on chronic anticoagulation therapy with recent hemorrhagic stroke wth right hemiparesis that discharged to SNF on  11/07/17 who is being seen today for the evaluation of Afib RVR at the request of Dr. Sharon Seller.  History of Present Illness:   Mr. Jump was last seen in clinic by Dr. Katrinka Blazing on 11/20/16 and was noted to be in NSR-sinus bradycardia for the first time in "years."  Continued on BB and diltiazem and continued coumadin.   He was recently hospitalized 10/27/17-11/07/17 with large left pons ICH with facial droop and dysarthria. INR on admission was 2.33 and anticoagulation was reversed with vitamin K. Cardiology was consulted for Aflutter with 2:1 block in the 160s. He was started on IV Cardizem and continued on PO lopressor. Cardizem was transitioned to 60 mg cardizem via NG tube and lopressor continued. He was noted to not be an anticoagulation candidate given his hemorrhagic stroke. He was discharged to a SNF 11/07/17.   Pt fell at SNF trying to get out of bed by himself even though he is max assist. He was brought back to [facility] for evaluation after striking his head. Head CT was unremarkable for acute processes. He was found to be in Afib RVR in the 120-160s and was started on IV diltiazem. Heart rate was still uncontrolled and he received one dose of IV lopressor which improved his rate to the 60s. He still has trouble with speech but was able to tell me that he has had not chest pain, problems breathing, problems bleeding, dizziness, and syncope. He did not lose consciousness during his fall last night.    Per the  family and notes, he has struggled with poor PO intake since being on the thickened liquid diet.  Dehydration likely playing a role in his RVR. Hydrating with IVF per primary team.   Past Medical History:  Diagnosis Date  . Atrial fibrillation (HCC)   . Atrial flutter (HCC)   . Atrial flutter (HCC)   . Edema 03/17/2009   Qualifier: Diagnosis of  By: Flonnie Overman    . GERD (gastroesophageal reflux disease)   . HTN (hypertension)   . Hypercholesteremia   . Hyperlipidemia   . Stroke [facility])     No past surgical history on file.   Home Medications:  Prior to Admission medications   Medication Sig Start Date End Date Taking? Authorizing Provider  atorvastatin (LIPITOR) 40 MG tablet Take 1 tablet (40 mg total) by mouth daily at 6 PM. 11/07/17  Yes Biby, Jani Files, NP  Calcium Carbonate-Vitamin D (CALCIUM 600+D3 PO) Take 1 tablet by mouth daily.    Yes [provider]  Cholecalciferol (VITAMIN D-3) 1000 units CAPS Take 1,000 Units by mouth daily.   Yes [provider]  diltiazem (CARDIZEM) 60 MG tablet Take 1 tablet (60 mg total) by mouth every 6 (six) hours. 11/07/17  Yes Layne Benton, NP  docusate sodium (COLACE) 100 MG capsule Take 1 capsule (100 mg total) by mouth 2 (two) times daily. 11/07/17 11/07/18 Yes Layne Benton, NP  metoprolol tartrate (LOPRESSOR) 50 MG tablet Take 1 tablet (50 mg total)  by mouth 2 (two) times daily. Patient taking differently: Take 50 mg by mouth 2 (two) times daily. "HOLD FOR HEART RATE <60; HOLD BLOOD PRESSURE MEDS FOR S B/P <120 AND NOTIFY MD" 11/07/17  Yes Layne Benton, NP  Multiple Vitamins-Minerals (MULTIVITAMIN WITH MINERALS) tablet Take 1 tablet by mouth daily.   Yes [provider]  pantoprazole (PROTONIX) 40 MG tablet Take 1 tablet (40 mg total) by mouth at bedtime. 11/07/17  Yes Layne Benton, NP  QUEtiapine (SEROQUEL) 25 MG tablet Take 0.5 tablets (12.5 mg total) by mouth 2 (two) times daily. 11/07/17  Yes Layne Benton, NP    feeding supplement, ENSURE ENLIVE, (ENSURE ENLIVE) LIQD Take 237 mLs by mouth 3 (three) times daily between meals. Patient not taking: Reported on 11/11/2017 11/07/17   Layne Benton, NP  Maltodextrin-Xanthan Gum (RESOURCE THICKENUP CLEAR) POWD Take 120 g by mouth as needed (for nectar thick liquids, adjust thickness per SLP recommendation). Patient not taking: Reported on 11/11/2017 11/07/17   Layne Benton, NP    Inpatient Medications: Scheduled Meds: . atorvastatin  40 mg Oral q1800  . cholecalciferol  1,000 Units Oral Daily  . docusate sodium  100 mg Oral BID  . enoxaparin (LOVENOX) injection  40 mg Subcutaneous Daily  . metoprolol tartrate      . metoprolol tartrate  5 mg Intravenous Q6H  . multivitamin with minerals  1 tablet Oral Daily  . pantoprazole  40 mg Oral QHS  . QUEtiapine  12.5 mg Oral BID  . sodium chloride flush  3 mL Intravenous Q12H   Continuous Infusions: . diltiazem (CARDIZEM) infusion 15 mg/hr (11/12/17 0310)   PRN Meds: LORazepam, RESOURCE THICKENUP CLEAR  Allergies:   No Known Allergies  Social History:   Social History   Socioeconomic History  . Marital status: Married    Spouse name: Not on file  . Number of children: Not on file  . Years of education: Not on file  . Highest education level: Not on file  Social Needs  . Financial resource strain: Not on file  . Food insecurity - worry: Not on file  . Food insecurity - inability: Not on file  . Transportation needs - medical: Not on file  . Transportation needs - non-medical: Not on file  Occupational History  . Not on file  Tobacco Use  . Smoking status: Never Smoker  . Smokeless tobacco: Never Used  Substance and Sexual Activity  . Alcohol use: No    Alcohol/week: 0.0 oz  . Drug use: No  . Sexual activity: Yes  Other Topics Concern  . Not on file  Social History Narrative  . Not on file    Family History:    Family History  Problem Relation Age of Onset  . Healthy Mother   .  Other Father        "too much iron in blood"  . Healthy Sister      ROS:  Please see the history of present illness.   All other ROS reviewed and negative.     Physical Exam/Data:   Vitals:   11/12/17 0925 11/12/17 0945 11/12/17 1015 11/12/17 1055  BP:  134/72  129/85  Pulse: 67 68 74   Resp: 16 (!) 21 15 18   Temp:    (!) 97.5 F (36.4 C)  TempSrc:    Axillary  SpO2: 98% 92% 99% 98%  Weight:    155 lb 13.8 oz (70.7 kg)  Height:  5\' 10"  (1.778 m)    Intake/Output Summary (Last 24 hours) at 11/12/2017 1442 Last data filed at 11/11/2017 2050 Gross per 24 hour  Intake 500 ml  Output -  Net 500 ml   Filed Weights   11/12/17 1055  Weight: 155 lb 13.8 oz (70.7 kg)   Body mass index is 22.36 kg/m.  General:  Frail elderly male in no acute distress HEENT: normal Neck: no JVD Vascular: No carotid bruits Cardiac:  Regular rhythm regular rate, no murmurs Lungs:  clear to auscultation bilaterally, no wheezing, rhonchi or rales, diminished in bases Abd: soft, nontender, no hepatomegaly  Ext: trace edema, chronic skin changes on left LE Musculoskeletal:  No deformities Skin: warm and dry  Neuro:  Right-sided weakness, dysarthria Psych:  Normal affect   EKG:  The EKG was personally reviewed and demonstrates:  Atrial flutter Telemetry:  Telemetry was personally reviewed and demonstrates:  Atrial flutter RVR, now rate controlled in the 60s  Relevant CV Studies:  Echo 11/07/17: Study Conclusions - HPI and indications: Atrial flutter. - Procedure narrative: Transthoracic echocardiography. Image   quality was fair. The study was technically difficult, as a   result of poor patient compliance. Intravenous contrast   (Definity) was administered. - Left ventricle: Wall thickness was increased in a pattern of   moderate LVH. Systolic function was normal. The estimated   ejection fraction was in the range of 55% to 60%. Wall motion was   normal; there were no regional wall  motion abnormalities. The   study is not technically sufficient to allow evaluation of LV   diastolic function. - Aortic valve: Trileaflet. Sclerosis without stenosis. There was   trivial regurgitation. - Mitral valve: Calcified annulus. Mildly thickened leaflets .   There was mild regurgitation. - Left atrium: Severely dilated. - Right ventricle: The cavity size was moderately dilated. Systolic   function is mildly reduced.  Impressions: - Technically difficult study. Atrial flutter is present. LVEF   55-60% by Definity contrast, mild MR, severe LAE.   Laboratory Data:  Chemistry Recent Labs  Lab 11/07/17 0820 11/11/17 2100 11/12/17 0509  NA 141 141 143  K 3.6 3.7 3.6  CL 110 106 110  CO2 21* 20* 21*  GLUCOSE 109* 98 109*  BUN 26* 20 18  CREATININE 1.09 1.19 1.20  CALCIUM 8.6* 9.1 8.9  GFRNONAA 60* 54* 53*  GFRAA >60 >60 >60  ANIONGAP 10 15 12     Recent Labs  Lab 11/12/17 0509  PROT 6.3*  ALBUMIN 2.9*  AST 40  ALT 41  ALKPHOS 135*  BILITOT 1.8*   Hematology Recent Labs  Lab 11/07/17 0820 11/11/17 2159 11/12/17 0509  WBC 11.1* 14.2* 12.8*  RBC 4.40 4.97 4.53  HGB 15.0 17.5* 15.7  HCT 44.6 49.3 45.4  MCV 101.4* 99.2 100.2*  MCH 34.1* 35.2* 34.7*  MCHC 33.6 35.5 34.6  RDW 13.4 12.9 13.2  PLT 229 230 219   Cardiac EnzymesNo results for input(s): TROPONINI in the last 168 hours.  Recent Labs  Lab 11/11/17 2024  TROPIPOC 0.00    BNP Recent Labs  Lab 11/11/17 2159  BNP 177.4*    DDimer No results for input(s): DDIMER in the last 168 hours.  Radiology/Studies:  Ct Head Wo Contrast  Result Date: 11/11/2017 CLINICAL DATA:  Patient fell out of bed. History of infarct and right-sided paralysis. Head trauma. EXAM: CT HEAD WITHOUT CONTRAST CT CERVICAL SPINE WITHOUT CONTRAST TECHNIQUE: Multidetector CT imaging of the head and cervical spine  was performed following the standard protocol without intravenous contrast. Multiplanar CT image reconstructions  of the cervical spine were also generated. COMPARISON:  10/27/2017 head CT. FINDINGS: CT HEAD FINDINGS Brain: Chronic left corona radiata lacunar infarct with moderate small vessel ischemic disease and cerebral near complete resolution of previously noted left pontine infarct with only a small focus of residual hyperdensity currently identified measuring approximately 8 x 2 mm. No hydrocephalus. Midline fourth ventricle and basal cisterns without effacement. Vascular: Calcific atherosclerosis of the carotid siphons and both vertebral arteries. No hyperdense vessel sign. Skull: No acute osseous abnormality. Sinuses/Orbits: Intact orbits and globes. Clear paranasal sinuses and mastoids. Other: None CT CERVICAL SPINE FINDINGS Alignment: Maintained cervical lordosis. Intact craniocervical relationship. Osteoarthritis of atlantodental with joint space narrowing and sclerosis. Skull base and vertebrae: No fracture of the skull base. No vertebral fracture. Listhesis. Soft tissues and spinal canal: No prevertebral fluid or swelling. No visible canal hematoma. Disc levels: No central canal stenosis or focal disc herniations. There is mild disc space narrowing at C3-4 with moderate to marked disc space narrowing at C6-7. Small posterior marginal osteophytes are seen at C6-7 minimal neural foraminal narrowing bilaterally. Multilevel degenerative facet arthropathy with ankylosed appearance of the C4-5 facets bilaterally. Bilateral C3-4 uncovertebral joint osteoarthritis with uncinate spurring. Upper chest: Limited by respiratory motion. No dominant mass. Scarring at the right lung apex. Bilateral upper lobe hazy opacities right greater left may represent passive congestion, alveolitis or pneumonitis among some possibilities. Other: None IMPRESSION: 1. Near complete resolution previously noted left pontine hemorrhage now measuring approximately 8 x 2 mm in transverse by AP dimension. 2. Atrophy with moderate chronic small vessel  ischemia, stable in appearance with chronic left corona radiata lacunar infarct. 3. No acute cervical spine fracture. Degenerative disc disease at C3-4 and C6-7 with multilevel degenerative facet arthropathy and ankylosed appearance of the facets at C4-5 bilaterally. Electronically Signed   By: Tollie Eth M.D.   On: 11/11/2017 21:57   Ct Cervical Spine Wo Contrast  Result Date: 11/11/2017 CLINICAL DATA:  Patient fell out of bed. History of infarct and right-sided paralysis. Head trauma. EXAM: CT HEAD WITHOUT CONTRAST CT CERVICAL SPINE WITHOUT CONTRAST TECHNIQUE: Multidetector CT imaging of the head and cervical spine was performed following the standard protocol without intravenous contrast. Multiplanar CT image reconstructions of the cervical spine were also generated. COMPARISON:  10/27/2017 head CT. FINDINGS: CT HEAD FINDINGS Brain: Chronic left corona radiata lacunar infarct with moderate small vessel ischemic disease and cerebral near complete resolution of previously noted left pontine infarct with only a small focus of residual hyperdensity currently identified measuring approximately 8 x 2 mm. No hydrocephalus. Midline fourth ventricle and basal cisterns without effacement. Vascular: Calcific atherosclerosis of the carotid siphons and both vertebral arteries. No hyperdense vessel sign. Skull: No acute osseous abnormality. Sinuses/Orbits: Intact orbits and globes. Clear paranasal sinuses and mastoids. Other: None CT CERVICAL SPINE FINDINGS Alignment: Maintained cervical lordosis. Intact craniocervical relationship. Osteoarthritis of atlantodental with joint space narrowing and sclerosis. Skull base and vertebrae: No fracture of the skull base. No vertebral fracture. Listhesis. Soft tissues and spinal canal: No prevertebral fluid or swelling. No visible canal hematoma. Disc levels: No central canal stenosis or focal disc herniations. There is mild disc space narrowing at C3-4 with moderate to marked disc  space narrowing at C6-7. Small posterior marginal osteophytes are seen at C6-7 minimal neural foraminal narrowing bilaterally. Multilevel degenerative facet arthropathy with ankylosed appearance of the C4-5 facets bilaterally. Bilateral C3-4 uncovertebral joint  osteoarthritis with uncinate spurring. Upper chest: Limited by respiratory motion. No dominant mass. Scarring at the right lung apex. Bilateral upper lobe hazy opacities right greater left may represent passive congestion, alveolitis or pneumonitis among some possibilities. Other: None IMPRESSION: 1. Near complete resolution previously noted left pontine hemorrhage now measuring approximately 8 x 2 mm in transverse by AP dimension. 2. Atrophy with moderate chronic small vessel ischemia, stable in appearance with chronic left corona radiata lacunar infarct. 3. No acute cervical spine fracture. Degenerative disc disease at C3-4 and C6-7 with multilevel degenerative facet arthropathy and ankylosed appearance of the facets at C4-5 bilaterally. Electronically Signed   By: Tollie Eth M.D.   On: 11/11/2017 21:57   Dg Chest Portable 1 View  Result Date: 11/11/2017 CLINICAL DATA:  Pain following fall EXAM: PORTABLE CHEST 1 VIEW COMPARISON:  November 05, 2017 FINDINGS: Lungs are clear. Heart is mildly enlarged with pulmonary vascularity within normal limits. No adenopathy. There is aortic atherosclerosis. There is evidence of old fracture of the left clavicle with remodeling. IMPRESSION: Stable cardiomegaly. No edema or consolidation. There is aortic atherosclerosis. Aortic Atherosclerosis (ICD10-I70.0). Electronically Signed   By: Bretta Bang III M.D.   On: 11/11/2017 21:19   Dg Shoulder Right Portable  Result Date: 11/11/2017 CLINICAL DATA:  Pain following fall EXAM: PORTABLE RIGHT SHOULDER: 2 V COMPARISON:  None. FINDINGS: Frontal and Y scapular views obtained. No evident fracture or dislocation. There is moderate generalized osteoarthritic change. No  erosive change. Visualized right lung clear. IMPRESSION: Generalized osteoarthritic change.  No fracture or dislocation. Electronically Signed   By: Bretta Bang III M.D.   On: 11/11/2017 23:52    Assessment and Plan:   1. Atrial fibrillation/flutter with RVR On diltiazem drip at 15 mg/hr. Lopressor 50 mg BID has been D/C'ed. Rates on telemetry in the 160s, now in the 60s after second dose of 5 mg IV lopressor. Pills are crushed and swallowed (no longer through NG tube). Resume 50 mg lopressor BID. Transition cardizem to 90 mg q 8 hr. He was previously on 60 mg q6hr. No longer on anticoagulation after hemorrhagic stroke. Do not recommend ASA at this time. He is tolerating this rhythm well, remains HDS.    2. HLD Continue statin   3. HTN Continue dilt and beta blocker as above.  Pressures have been well-controlled.    For questions or updates, please contact [facility] Please consult [email or web address] for contact info under Cardiology/STEMI.   Signed, Marcelino Duster, PA  11/12/2017 2:42 PM As above patient seen and examined.  Briefly he is an 84 year old male with past medical history of atrial fibrillation/flutter, recent hemorrhagic CVA, hypertension, chronic diastolic congestive heart failure, hyperlipidemia for evaluation of atrial flutter with rapid ventricular response.  Patient was recently discharged following unprovoked intracranial hemorrhage on Coumadin (INR 2.33).  He developed hemiparesis and was discharged to SNF.  Patient apparently was trying to get out of bed by himself and fell striking his head.  Head CT was negative but patient was admitted as he was in atrial flutter with rapid ventricular response.  Cardiology now asked to evaluate.  History is difficult due to dysarthria from prior CVA.  However he denies dyspnea, chest pain, palpitations, syncope.  Electrocardiogram shows atypical atrial flutter with rapid ventricular response.  Prior anterior infarct cannot be  excluded.  1 atrial flutter-patient's heart rate was initially elevated.  However his Lopressor had been held. He is now on IV cardizem and received IV Lopressor and heart  rate is now in the 60s.  We will continue with IV Cardizem.  We will resume oral metoprolol tomorrow morning.  Transition to oral Cardizem at home dose tomorrow morning.  Follow heart rate and adjust accordingly. CHADSvasc 5.  However he is clearly not a candidate for anticoagulation given recent spontaneous intracranial hemorrhage.  2 hypertension-blood pressure is controlled with Cardizem and metoprolol.  We will continue.  3 hyperlipidemia-continue statin.  Olga Millers, MD

## 2017-11-12 NOTE — ED Notes (Signed)
meds not given, must be crushed due to dysphagia. Pt agitated, not willing to swallow. Risk for aspiration. Provider aware. Pt given ativan.

## 2017-11-12 NOTE — ED Notes (Signed)
Pt has stage one pressure ulcer on right buttocks. Skin not intact. Pt also has stage 1 pressure ulcer on saccrum

## 2017-11-13 ENCOUNTER — Other Ambulatory Visit: Payer: Self-pay

## 2017-11-13 ENCOUNTER — Encounter (HOSPITAL_COMMUNITY): Payer: Self-pay

## 2017-11-13 DIAGNOSIS — R1312 Dysphagia, oropharyngeal phase: Secondary | ICD-10-CM

## 2017-11-13 DIAGNOSIS — I5032 Chronic diastolic (congestive) heart failure: Secondary | ICD-10-CM

## 2017-11-13 DIAGNOSIS — W19XXXA Unspecified fall, initial encounter: Secondary | ICD-10-CM

## 2017-11-13 DIAGNOSIS — I10 Essential (primary) hypertension: Secondary | ICD-10-CM

## 2017-11-13 DIAGNOSIS — I619 Nontraumatic intracerebral hemorrhage, unspecified: Secondary | ICD-10-CM

## 2017-11-13 LAB — CBC
HEMATOCRIT: 42.1 % (ref 39.0–52.0)
Hemoglobin: 14.3 g/dL (ref 13.0–17.0)
MCH: 34.5 pg — ABNORMAL HIGH (ref 26.0–34.0)
MCHC: 34 g/dL (ref 30.0–36.0)
MCV: 101.7 fL — ABNORMAL HIGH (ref 78.0–100.0)
PLATELETS: 256 10*3/uL (ref 150–400)
RBC: 4.14 MIL/uL — AB (ref 4.22–5.81)
RDW: 13.5 % (ref 11.5–15.5)
WBC: 11.6 10*3/uL — AB (ref 4.0–10.5)

## 2017-11-13 LAB — BASIC METABOLIC PANEL
Anion gap: 10 (ref 5–15)
BUN: 18 mg/dL (ref 6–20)
CHLORIDE: 107 mmol/L (ref 101–111)
CO2: 25 mmol/L (ref 22–32)
Calcium: 8.7 mg/dL — ABNORMAL LOW (ref 8.9–10.3)
Creatinine, Ser: 1.12 mg/dL (ref 0.61–1.24)
GFR calc Af Amer: >60 mL/min (ref >60)
GFR, EST NON AFRICAN AMERICAN: 58 mL/min — AB (ref >60)
Glucose, Bld: 121 mg/dL — ABNORMAL HIGH (ref 65–99)
Potassium: 3.5 mmol/L (ref 3.5–5.1)
Sodium: 142 mmol/L (ref 135–145)

## 2017-11-13 MED ORDER — DEXTROSE-NACL 5-0.9 % IV SOLN
INTRAVENOUS | Status: DC
  Administered 2017-11-13 – 2017-11-16 (×4): via INTRAVENOUS

## 2017-11-13 MED ORDER — DILTIAZEM HCL 25 MG/5ML IV SOLN
10.0000 mg | Freq: Once | INTRAVENOUS | Status: AC
  Administered 2017-11-13: 10 mg via INTRAVENOUS
  Filled 2017-11-13: qty 5

## 2017-11-13 NOTE — Clinical Social Work Note (Signed)
Clinical Social Work Assessment  Patient Details  Name: [name] MRN: [id number] Date of Birth: 06-24-1930  Date of referral:  11/13/17               Reason for consult:  Facility Placement(from [name])                Permission sought to share information with:  Facility Sport and exercise psychologist, Family Supports Permission granted to share information::  No(patient asleep, family at bedside)  Name::     [name], [name]::  [name] [facility]  Relationship::  son, wife, daughter  Sport and exercise psychologist Information:  [name] [phone]  Housing/Transportation Living arrangements for the past 2 months:  [place], [place] of Information:  Adult Children, Spouse Patient Interpreter Needed:  None Criminal Activity/Legal Involvement Pertinent to Current Situation/Hospitalization:  No - Comment as needed Significant Relationships:  Spouse, Adult Children Lives with:  Spouse Do you feel safe going back to the place where you live?  Yes Need for family participation in patient care:  Yes (Comment)  Care giving concerns: Patient from [facility] for rehab. Just discharged to [facility] from hospital on 3/13, readmit in hospital 3/17. Patient from home with wife before previous admission and rehab stay.   Social Worker assessment / plan: CSW met with patient's son, [name], and wife at bedside. Patient was awake at start of assessment but then fell asleep. Patient's family would prefer for patient to return to [facility] to continue rehab. CSW explained possible barriers with [employer]. [name] will need to obtain new authorization for patient to re-admit there. CSW advised that if patient is medically ready before [name] is received, patient may need to discharge to a facility that will accept him with auth pending. Family indicated understanding.  CSW sent clinicals to [name] so facility can start [name] request. CSW awaiting [employer] determination. CSW to support with  discharge planning.  Employment status:  Retired Astronomer) PT Recommendations:  [place] / Referral to community resources:  [name]  Patient/Family's Response to care: Family appreciative of patient's care.  Patient/Family's Understanding of and Emotional Response to Diagnosis, Current Treatment, and Prognosis: Family with understanding of patient's condition and hopeful for return to [facility] for continued rehab.  Emotional Assessment Appearance:  Appears stated age Attitude/Demeanor/Rapport:  Unable to Assess Affect (typically observed):  Unable to Assess Orientation:  Oriented to Self, Oriented to Place, Oriented to Situation(as documented by RN; patient asleep and CSW unable to verify) Alcohol / Substance use:  Not Applicable Psych involvement (Current and /or in the community):  No (Comment)  Discharge Needs  Concerns to be addressed:  Discharge Planning Concerns, Care Coordination Readmission within the last 30 days:  Yes Current discharge risk:  Dependent with Mobility, Physical Impairment Barriers to Discharge:  Continued Medical Work up, [place], [name] 11/13/2017, 4:04 PM

## 2017-11-13 NOTE — Evaluation (Signed)
Physical Therapy Evaluation Patient Details Name: [name] MRN: [id number] DOB: [birth year] Today's Date: 11/13/2017   History of Present Illness  84 year old man with a hx of atrial fibrillation, HTN, and recent hemorrhagic CVA of the left pons (resultant right sided facial droop, expressive dysarthria, dysphagia requiring nectar thick liquids, and right sided hemiparesis) who was discharged to a SNF on 11/07/2017. He suffered a mechanical fall 11/11/17, and was brought to the ED for evaluation. Admitted with atrial fibrillation with RVR.  Clinical Impression  Pt admitted with/for complication above necessitating return to hospital.  Pt still needing significant assist for basic mobility.  Pt currently limited functionally due to the problems listed. ( See problems list.)   Pt will benefit from PT to maximize function and safety in order to get ready for next venue listed below.     Follow Up Recommendations SNF;Supervision/Assistance - 24 hour    Equipment Recommendations  None recommended by PT    Recommendations for Other Services       Precautions / Restrictions Precautions Precautions: Fall Precaution Comments: R sided weakness Restrictions Weight Bearing Restrictions: No      Mobility  Bed Mobility Overal bed mobility: Needs Assistance Bed Mobility: Supine to Sit     Supine to sit: Max assist     General bed mobility comments: Able to initiate but requires max assist.   Transfers Overall transfer level: Needs assistance Equipment used: 2 person hand held assist Transfers: Sit to/from [employer] Transfers Sit to Stand: Max assist;+2 safety/equipment Stand pivot transfers: Max assist;+2 safety/equipment       General transfer comment: Pt requiring assistance to power up. Initial blocking at R knee but able to sustain extension to stand with max assist with cues during toileting hygiene.   Ambulation/Gait             General Gait Details: Not  tested  Stairs            Wheelchair Mobility    Modified Rankin (Stroke Patients Only) Modified Rankin (Stroke Patients Only) Pre-Morbid Rankin Score: No symptoms Modified Rankin: Severe disability     Balance Overall balance assessment: Needs assistance Sitting-balance support: Feet supported;No upper extremity supported Sitting balance-Leahy Scale: Poor Sitting balance - Comments: Progressed from mod assist to min guard assist seated at EOB.  Postural control: Posterior lean Standing balance support: During functional activity;Bilateral upper extremity supported Standing balance-Leahy Scale: Poor Standing balance comment: Max assist to sustain standing.                              Pertinent Vitals/Pain Pain Assessment: No/denies pain    Home Living Family/patient expects to be discharged to:: Skilled nursing facility Living Arrangements: Spouse/significant other Available Help at Discharge: Family;Available 24 hours/day(wife elderly; unsure if can care for pt) Type of Home: House Home Access: Stairs to enter   [employer] of Steps: 2 Home Layout: Two level;Able to live on main level with bedroom/bathroom;Laundry or work area in basement        Prior Function Level of Independence: Needs assistance   Gait / Transfers Assistance Needed: Has not been up ambulating at SNF per pt.   ADL's / Homemaking Assistance Needed: Assist from staff  Comments: Pt independent prior to recent CVA. At SNF reports had not started much rehabilitation.      Hand Dominance   Dominant Hand: Right    Extremity/Trunk Assessment   Upper Extremity Assessment  Upper Extremity Assessment: RUE deficits/detail RUE Deficits / Details: Spasticity noted at elbow. 2/5 strength at R elbow. No active movement R shoulder but PROM in tact. 3/5 grasp strength.  RUE Coordination: decreased fine motor;decreased gross motor    Lower Extremity Assessment Lower Extremity  Assessment: RLE deficits/detail;Generalized weakness RLE Deficits / Details: grossly 2+ to 3-/5, movements not very isolated, RLE Sensation: WNL    Cervical / Trunk Assessment Cervical / Trunk Assessment: Kyphotic Cervical / Trunk Exceptions: Forward head posture with rounded shoulders in sitting  Communication   Communication: Expressive difficulties  Cognition Arousal/Alertness: Awake/alert Behavior During Therapy: WFL for tasks assessed/performed Overall Cognitive Status: Difficult to assess Area of Impairment: Attention;Memory;Following commands;Safety/judgement;Awareness;Problem solving                   Current Attention Level: Sustained Memory: Decreased short-term memory Following Commands: Follows one step commands with increased time Safety/Judgement: Decreased awareness of deficits Awareness: Intellectual Problem Solving: Slow processing;Requires verbal cues General Comments: Pt able to follow commands with increased time. Attention is a limiting factor with functional tasks.       General Comments General comments (skin integrity, edema, etc.): pt's HR variable while trying to use the bathroom at between 100's to 130's bpm.  Sats maintained in the 90''s    Exercises     Assessment/Plan    PT Assessment Patient needs continued PT services  PT Problem List Decreased strength;Decreased activity tolerance;Decreased balance;Decreased mobility;Decreased coordination;Pain       PT Treatment Interventions DME instruction;Gait training;Stair training;Functional mobility training;Therapeutic exercise;Therapeutic activities;Neuromuscular re-education;Patient/family education;Cognitive remediation    PT Goals (Current goals can be found in the Care Plan section)  Acute Rehab PT Goals Patient Stated Goal: walking PT Goal Formulation: With patient/family Time For Goal Achievement: 11/11/17 Potential to Achieve Goals: Good    Frequency Min 3X/week   Barriers to  discharge        Co-evaluation PT/OT/SLP Co-Evaluation/Treatment: Yes Reason for Co-Treatment: For patient/therapist safety PT goals addressed during session: Mobility/safety with mobility OT goals addressed during session: ADL's and self-care;Strengthening/ROM       AM-PAC PT "6 Clicks" Daily Activity  Outcome Measure Difficulty turning over in bed (including adjusting bedclothes, sheets and blankets)?: Unable Difficulty moving from lying on back to sitting on the side of the bed? : Unable Difficulty sitting down on and standing up from a chair with arms (e.g., wheelchair, bedside commode, etc,.)?: Unable Help needed moving to and from a bed to chair (including a wheelchair)?: A Lot Help needed walking in hospital room?: Total Help needed climbing 3-5 steps with a railing? : Total 6 Click Score: 7    End of Session Equipment Utilized During Treatment: Gait belt Activity Tolerance: Patient limited by fatigue Patient left: in chair;with call bell/phone within reach;with chair alarm set Nurse Communication: Mobility status;Need for lift equipment PT Visit Diagnosis: Hemiplegia and hemiparesis;Muscle weakness (generalized) (M62.81);Other abnormalities of gait and mobility (R26.89) Hemiplegia - Right/Left: Right Hemiplegia - dominant/non-dominant: Dominant Hemiplegia - caused by: Nontraumatic intracerebral hemorrhage    Time: 1120-1205 PT Time Calculation (min) (ACUTE ONLY): 45 min   Charges:   PT Evaluation $PT Eval Moderate Complexity: 1 Mod     PT G Codes:        11/13/2017  [place] BingKen Tinzlee Craker, PT [phone]  (pager)  Eliseo GumKenneth V Elese Rane 11/13/2017, 3:21 PM

## 2017-11-13 NOTE — Evaluation (Signed)
Occupational Therapy Evaluation Patient Details Name: [name] R Mcdade MRN: [id number] DOB: 01/05/1930 Today's Date: 11/13/2017    History of Present Illness 84 year old man with a hx of atrial fibrillation, HTN, and recent hemorrhagic CVA of the left pons (resultant right sided facial droop, expressive dysarthria, dysphagia requiring nectar thick liquids, and right sided hemiparesis) who was discharged to a SNF on 11/07/2017. He suffered a mechanical fall 11/11/17, and was brought to the ED for evaluation. Admitted with atrial fibrillation with RVR.   Clinical Impression   [facility]. He reports that prior to CVA he was independent with ADL and functional mobility. Pt currently requiring max assist +2 for safety overall for toilet transfers, total assistance for LB ADL and toileting hygiene. He is able to complete self-feeding and grooming tasks with mod assist. Pt presents with expressive communication deficits, decreased awareness, decreased attention, double vision with midline gaze, decreased ability to sustain visual attention, and significant R sided weakness. Pt would benefit from continued OT services while admitted to improve independence and safety with ADL and functional mobility. Recommend continued rehabilitation at SNF level once medically stable for discharge. OT will continue to follow while admitted.     Follow Up Recommendations  SNF;Supervision/Assistance - 24 hour    Equipment Recommendations  Other (comment)(defer to next venue of care)    Recommendations for Other Services       Precautions / Restrictions Precautions Precautions: Fall Precaution Comments: R sided weakness Restrictions Weight Bearing Restrictions: No      Mobility Bed Mobility Overal bed mobility: Needs Assistance Bed Mobility: Supine to Sit     Supine to sit: Max assist     General bed mobility comments: Able to initiate but requires max  assist.   Transfers Overall transfer level: Needs assistance Equipment used: 2 person hand held assist Transfers: Sit to/from [employer]'sStand;Stand Pivot Transfers Sit to Stand: Max assist;+2 safety/equipment Stand pivot transfers: Max assist;+2 safety/equipment       General transfer comment: Pt requiring assistance to power up. Initial blocking at R knee but able to sustain extension to stand with max assist with cues during toileting hygiene.     Balance Overall balance assessment: Needs assistance Sitting-balance support: Feet supported;No upper extremity supported Sitting balance-Leahy Scale: Poor Sitting balance - Comments: Progressed from mod assist to min guard assist seated at EOB.  Postural control: Posterior lean Standing balance support: During functional activity;Bilateral upper extremity supported Standing balance-Leahy Scale: Poor Standing balance comment: Max assist to sustain standing.                            ADL either performed or assessed with clinical judgement   ADL Overall ADL's : Needs assistance/impaired Eating/Feeding: Sitting;Moderate assistance   Grooming: Sitting;Moderate assistance   Upper Body Bathing: Maximal assistance;Sitting   Lower Body Bathing: Sit to/from stand;Total assistance   Upper Body Dressing : Maximal assistance;Sitting   Lower Body Dressing: Maximal assistance;Sit to/from stand   Toilet Transfer: Maximal assistance;Stand-pivot;BSC   Toileting- Clothing Manipulation and Hygiene: Total assistance;Sit to/from stand       Functional mobility during ADLs: Maximal assistance;+2 for safety/equipment General ADL Comments: Able to assist with powering up to standing.      Vision Baseline Vision/History: Wears glasses Wears Glasses: At all times Patient Visual Report: Diplopia Vision Assessment?: Yes Eye Alignment: Within Functional Limits Ocular Range of Motion: Within Functional Limits Alignment/Gaze Preference: Gaze  left Tracking/Visual Pursuits: (increased eye shifts during movement; poor smoothness) Saccades: Undershoots;Decreased speed of saccadic movement Diplopia Assessment: Objects split side to side(only with midline gaze) Additional Comments: Undershooting with reach to target. Pt reports that diplopia resolved after fall yesterday. Poor ability to maintain gaze out of midline. Fatigues quickly with poor attention. Diplopia present with midline gaze only.     Perception     Praxis      Pertinent Vitals/Pain Pain Assessment: No/denies pain     Hand Dominance Right   Extremity/Trunk Assessment Upper Extremity Assessment Upper Extremity Assessment: RUE deficits/detail RUE Deficits / Details: Spasticity noted at elbow. 2/5 strength at R elbow. No active movement R shoulder but PROM in tact. 3/5 grasp strength.  RUE Coordination: decreased fine motor;decreased gross motor   Lower Extremity Assessment Lower Extremity Assessment: Defer to PT evaluation       Communication Communication Communication: Expressive difficulties   Cognition Arousal/Alertness: Awake/alert Behavior During Therapy: WFL for tasks assessed/performed Overall Cognitive Status: Difficult to assess Area of Impairment: Attention;Memory;Following commands;Safety/judgement;Awareness;Problem solving                   Current Attention Level: Sustained Memory: Decreased short-term memory Following Commands: Follows one step commands with increased time Safety/Judgement: Decreased awareness of deficits Awareness: Intellectual Problem Solving: Slow processing;Requires verbal cues General Comments: Pt able to follow commands with increased time. Attention is a limiting factor with functional tasks.    General Comments       Exercises     Shoulder Instructions      Home Living Family/patient expects to be discharged to:: Skilled nursing facility Living Arrangements: Spouse/significant other Available Help  at Discharge: Family;Available 24 hours/day(wife elderly; unsure if can care for pt) Type of Home: House Home Access: Stairs to enter [employer] of Steps: 2   Home Layout: Two level;Able to live on main level with bedroom/bathroom;Laundry or work area in [employer] Shower/Tub: Producer, television/film/video: Standard                Prior Functioning/Environment Level of Independence: Needs assistance  Gait / Transfers Assistance Needed: Has not been up ambulating at SNF per pt.  ADL's / Homemaking Assistance Needed: Assist from staff   Comments: Pt independent prior to recent CVA. At SNF reports had not started much rehabilitation.         OT Problem List: Decreased strength;Decreased range of motion;Decreased activity tolerance;Impaired balance (sitting and/or standing);Decreased safety awareness;Decreased knowledge of use of DME or AE;Decreased knowledge of precautions;Pain      OT Treatment/Interventions: Self-care/ADL training;Therapeutic exercise;Neuromuscular education;Energy conservation;DME and/or AE instruction;Therapeutic activities;Cognitive remediation/compensation;Visual/perceptual remediation/compensation;Patient/family education;Balance training;Splinting;Modalities    OT Goals(Current goals can be found in the care plan section) Acute Rehab OT Goals Patient Stated Goal: walking OT Goal Formulation: With patient Time For Goal Achievement: 11/27/17 Potential to Achieve Goals: Good ADL Goals Pt Will Perform Grooming: with min guard assist;sitting Pt Will Transfer to Toilet: with min assist;stand pivot transfer;bedside commode Pt Will Perform Toileting - Clothing Manipulation and hygiene: sit to/from stand;with mod assist Additional ADL Goal #1: Pt will complete bed mobility with overall min assist in preparation for ADL participation seated at EOB. Additional ADL Goal #2: Pt will demonstrate selective attention to seated ADL tasks in a  minimally distracting environment.  OT Frequency: Min 2X/week   Barriers to D/C:            Co-evaluation PT/OT/SLP Co-Evaluation/Treatment: Yes Reason for  Co-Treatment: For patient/therapist safety;To address functional/ADL transfers   OT goals addressed during session: ADL's and self-care;Strengthening/ROM      AM-PAC PT "6 Clicks" Daily Activity     Outcome Measure Help from another person eating meals?: A Lot Help from another person taking care of personal grooming?: A Lot Help from another person toileting, which includes using toliet, bedpan, or urinal?: A Lot Help from another person bathing (including washing, rinsing, drying)?: A Lot Help from another person to put on and taking off regular upper body clothing?: A Lot Help from another person to put on and taking off regular lower body clothing?: A Lot 6 Click Score: 12   End of Session Nurse Communication: Mobility status  Activity Tolerance: Patient tolerated treatment well Patient left: in chair;with call bell/phone within reach;with chair alarm set  OT Visit Diagnosis: Unsteadiness on feet (R26.81);Other abnormalities of gait and mobility (R26.89);Muscle weakness (generalized) (M62.81);Hemiplegia and hemiparesis Hemiplegia - Right/Left: Right Hemiplegia - dominant/non-dominant: Dominant Hemiplegia - caused by: Cerebral infarction                Time: 1120-1205 OT Time Calculation (min): 45 min Charges:  OT General Charges $OT Visit: 1 Visit OT Evaluation $OT Eval Moderate Complexity: 1 Mod OT Treatments $Self Care/Home Management : 8-22 mins G-Codes:     Doristine Section, MS OTR/L  Pager: [phone]   Staley Lunz A Shalunda Lindh 11/13/2017, 1:59 PM

## 2017-11-13 NOTE — Progress Notes (Signed)
PROGRESS NOTE    [name]  ZOX:[id number] DOB: April 05, 1930 DOA: 11/11/2017 PCP: Patient, No Pcp Per   Brief Narrative:  84 year old WM PMHx Atrial Fibrillation/A -Flutter, HTN, HLD, recent Hemorrhagic CVA of the left pons with resultant right sided facial droop, expressive dysarthria, dysphagia requiring dysphagia nectar thick liquid liquid diet, and right sided hemiparesis who was discharged to skilled nursing facility to optimize rehabilitation efforts on 11/07/2017.   Suffered an observe mechanical fall the day of admission, due to concerns about trauma he was transported to [facility].   History is limited due to the patient's underlying dysarthria, he is able to deny being any pain, having any chest pain, or shortness of breath.  His daughter is reachable by phone and reports that he has had challenges with his thickened liquid diet, sites that his by mouth intake has been very poor. She relays that he formerly worked at a garden center office, before his stroke earlier this month he was living independently alone, frequently working outdoors in his garden. She relays that his mother died of aortic aneurysm and that his father died of complications of lead poisoning.   Upon review of cardiac monitoring tracings from EMS, appears to my a flutter with 2 to one conduction, heart rate of 150.   ED Course: in emergency department vital signs remarkable for tachycardia with heart rate ranging from 120s to 160, systolic blood pressure over 100. CBC reveals elevated hemoglobin of 17.5, and leukocytosis to 14.2.  Creatinine of 1.2. BNP 177. Troponin undetectable.  EKG reveals tachycardia arrhythmia with rate in the 130s.  CT imaging of the cervical spine and head revealed near complete resolution of prior left pontine hemorrhage, as well as atrophy with moderate chronic small vessel ischemia stable in appearance. No acute cervical spine fracture. Chest x-ray without acute findings.  Patient  was given 500 mL of normal saline, as well as IV diltiazem 2 without improvement in the heart rate, therefore was placed on a diltiazem drip.  Hospital medicine consulted for further management.   Review of Systems: A complete ROS was obtained; pertinent positives negatives are denoted in the HPI. Otherwise, all systems are negative.     Subjective: 3/19  A/O 4, severe dysarthria secondary to recent CVA,negative CP, negative SOB Negative abnal pain.   Assessment & Plan:   Active Problems:   Atrial fibrillation with rapid ventricular response (HCC)  Atrial flutter/A. Fib with RVR -per cardiology Would continue Cardizem 90 mg every 8 hours.  This can be increased to 120 mg every 8 hours if needed.  Continue metoprolol 50 mg twice daily.  Follow heart rate today and if reasonably well controlled patient can be discharged and follow-up with Dr. Katrinka Blazing.   -Off anticoagulation secondary to hemorrhagic CVA 10/2017     Chronic Diastolic CHF -strict in and out -Daily weight  Essential Hypertension -See A flutter   Dehydration -D5 -0.9% saline 78ml/hr  Large LEFT pons hemorrhagic CVA 10/27/2017 -Discharged 11/07/17  -right hemiparesis, will need to return to SNF    Dysphagia  -dysphagia 1 diet fluid consistency nectar thick -not convinced patient will be able to take adequate amount of nutrition PO, start calorie count. May require PEG tube placement    HLD -Lipid panel pending       DVT prophylaxis: SCD Code Status: dO NOT RESUSCITATE Family Communication: None Disposition Plan: SNF   Consultants:  Cardiology   Procedures/Significant Events:     I have personally reviewed and  interpreted all radiology studies and my findings are as above.  VENTILATOR SETTINGS:    Cultures   Antimicrobials:    Devices    LINES / TUBES:      Continuous Infusions:   Objective: Vitals:   11/13/17 0425 11/13/17 0455 11/13/17 0655 11/13/17 0732  BP: (!) 109/58 119/61  (!) 141/106 100/69  Pulse:    84  Resp:      Temp:    (!) 97.5 F (36.4 C)  TempSrc:    Oral  SpO2: 100% 95%  99%  Weight:      Height:        Intake/Output Summary (Last 24 hours) at 11/13/2017 0835 Last data filed at 11/13/2017 0600 Gross per 24 hour  Intake 565 ml  Output 400 ml  Net 165 ml   Filed Weights   11/12/17 1055 11/13/17 0325  Weight: 155 lb 13.8 oz (70.7 kg) 160 lb 4.4 oz (72.7 kg)    Examination:  General: A/O 4,No acute respiratory distress Neck:  Negative scars, masses, torticollis, lymphadenopathy, JVD Lungs: Clear to auscultation bilaterally without wheezes or crackles Cardiovascular: Regular rate and rhythm without murmur gallop or rub normal S1 and S2 Abdomen: negative abdominal pain, nondistended, positive soft, bowel sounds, no rebound, no ascites, no appreciable mass Extremities: No significant cyanosis, clubbing, or edema bilateral lower extremities Skin: Negative rashes, lesions, ulcers Psychiatric:  Negative depression, negative anxiety, negative fatigue, negative mania  Central nervous system:  Cranial nerves II through XII intact, tongue/uvula midline, LEFT extremities muscle strength 5/5,RIGHT hemiparesis, sensation intact throughout,  positive dysarthria, negative expressive aphasia, negative receptive aphasia.  .     Data Reviewed: Care during the described time interval was provided by me .  I have reviewed this patient's available data, including medical history, events of note, physical examination, and all test results as part of my evaluation.   CBC: Recent Labs  Lab 11/07/17 0820 11/11/17 2159 11/12/17 0509 11/13/17 0408  WBC 11.1* 14.2* 12.8* 11.6*  HGB 15.0 17.5* 15.7 14.3  HCT 44.6 49.3 45.4 42.1  MCV 101.4* 99.2 100.2* 101.7*  PLT 229 230 219 256   Basic Metabolic Panel: Recent Labs  Lab 11/07/17 0820 11/11/17 2100 11/12/17 0509 11/13/17 0408  NA 141 141 143 142  K 3.6 3.7 3.6 3.5  CL 110 106 110 107  CO2 21* 20*  21* 25  GLUCOSE 109* 98 109* 121*  BUN 26* 20 18 18   CREATININE 1.09 1.19 1.20 1.12  CALCIUM 8.6* 9.1 8.9 8.7*  MG  --  2.0  --   --    GFR: Estimated Creatinine Clearance: 49.6 mL/min (by C-G formula based on SCr of 1.12 mg/dL). Liver Function Tests: Recent Labs  Lab 11/12/17 0509  AST 40  ALT 41  ALKPHOS 135*  BILITOT 1.8*  PROT 6.3*  ALBUMIN 2.9*   No results for input(s): LIPASE, AMYLASE in the last 168 hours. No results for input(s): AMMONIA in the last 168 hours. Coagulation Profile: Recent Labs  Lab 11/12/17 0509  INR 1.30   Cardiac Enzymes: No results for input(s): CKTOTAL, CKMB, CKMBINDEX, TROPONINI in the last 168 hours. BNP (last 3 results) No results for input(s): PROBNP in the last 8760 hours. HbA1C: No results for input(s): HGBA1C in the last 72 hours. CBG: Recent Labs  Lab 11/06/17 1640 11/06/17 2207 11/07/17 0704 11/07/17 1610  GLUCAP 105* 150* 109* 97   Lipid Profile: No results for input(s): CHOL, HDL, LDLCALC, TRIG, CHOLHDL, LDLDIRECT in the last  72 hours. Thyroid Function Tests: Recent Labs    11/11/17 2159  TSH 0.915  FREET4 1.28*   Anemia Panel: No results for input(s): VITAMINB12, FOLATE, FERRITIN, TIBC, IRON, RETICCTPCT in the last 72 hours. Urine analysis:    Component Value Date/Time   COLORURINE YELLOW 11/05/2017 0331   APPEARANCEUR CLEAR 11/05/2017 0331   LABSPEC 1.009 11/05/2017 0331   PHURINE 8.0 11/05/2017 0331   GLUCOSEU NEGATIVE 11/05/2017 0331   HGBUR SMALL (A) 11/05/2017 0331   BILIRUBINUR NEGATIVE 11/05/2017 0331   KETONESUR NEGATIVE 11/05/2017 0331   PROTEINUR 30 (A) 11/05/2017 0331   NITRITE NEGATIVE 11/05/2017 0331   LEUKOCYTESUR NEGATIVE 11/05/2017 0331   Sepsis Labs: @LABRCNTIP (procalcitonin:4,lacticidven:4)  )No results found for this or any previous visit (from the past 240 hour(s)).       Radiology Studies: Ct Head Wo Contrast  Result Date: 11/11/2017 CLINICAL DATA:  Patient fell out of bed.  History of infarct and right-sided paralysis. Head trauma. EXAM: CT HEAD WITHOUT CONTRAST CT CERVICAL SPINE WITHOUT CONTRAST TECHNIQUE: Multidetector CT imaging of the head and cervical spine was performed following the standard protocol without intravenous contrast. Multiplanar CT image reconstructions of the cervical spine were also generated. COMPARISON:  10/27/2017 head CT. FINDINGS: CT HEAD FINDINGS Brain: Chronic left corona radiata lacunar infarct with moderate small vessel ischemic disease and cerebral near complete resolution of previously noted left pontine infarct with only a small focus of residual hyperdensity currently identified measuring approximately 8 x 2 mm. No hydrocephalus. Midline fourth ventricle and basal cisterns without effacement. Vascular: Calcific atherosclerosis of the carotid siphons and both vertebral arteries. No hyperdense vessel sign. Skull: No acute osseous abnormality. Sinuses/Orbits: Intact orbits and globes. Clear paranasal sinuses and mastoids. Other: None CT CERVICAL SPINE FINDINGS Alignment: Maintained cervical lordosis. Intact craniocervical relationship. Osteoarthritis of atlantodental with joint space narrowing and sclerosis. Skull base and vertebrae: No fracture of the skull base. No vertebral fracture. Listhesis. Soft tissues and spinal canal: No prevertebral fluid or swelling. No visible canal hematoma. Disc levels: No central canal stenosis or focal disc herniations. There is mild disc space narrowing at C3-4 with moderate to marked disc space narrowing at C6-7. Small posterior marginal osteophytes are seen at C6-7 minimal neural foraminal narrowing bilaterally. Multilevel degenerative facet arthropathy with ankylosed appearance of the C4-5 facets bilaterally. Bilateral C3-4 uncovertebral joint osteoarthritis with uncinate spurring. Upper chest: Limited by respiratory motion. No dominant mass. Scarring at the right lung apex. Bilateral upper lobe hazy opacities right  greater left may represent passive congestion, alveolitis or pneumonitis among some possibilities. Other: None IMPRESSION: 1. Near complete resolution previously noted left pontine hemorrhage now measuring approximately 8 x 2 mm in transverse by AP dimension. 2. Atrophy with moderate chronic small vessel ischemia, stable in appearance with chronic left corona radiata lacunar infarct. 3. No acute cervical spine fracture. Degenerative disc disease at C3-4 and C6-7 with multilevel degenerative facet arthropathy and ankylosed appearance of the facets at C4-5 bilaterally. Electronically Signed   By: Tollie Eth M.D.   On: 11/11/2017 21:57   Ct Cervical Spine Wo Contrast  Result Date: 11/11/2017 CLINICAL DATA:  Patient fell out of bed. History of infarct and right-sided paralysis. Head trauma. EXAM: CT HEAD WITHOUT CONTRAST CT CERVICAL SPINE WITHOUT CONTRAST TECHNIQUE: Multidetector CT imaging of the head and cervical spine was performed following the standard protocol without intravenous contrast. Multiplanar CT image reconstructions of the cervical spine were also generated. COMPARISON:  10/27/2017 head CT. FINDINGS: CT HEAD FINDINGS Brain: Chronic  left corona radiata lacunar infarct with moderate small vessel ischemic disease and cerebral near complete resolution of previously noted left pontine infarct with only a small focus of residual hyperdensity currently identified measuring approximately 8 x 2 mm. No hydrocephalus. Midline fourth ventricle and basal cisterns without effacement. Vascular: Calcific atherosclerosis of the carotid siphons and both vertebral arteries. No hyperdense vessel sign. Skull: No acute osseous abnormality. Sinuses/Orbits: Intact orbits and globes. Clear paranasal sinuses and mastoids. Other: None CT CERVICAL SPINE FINDINGS Alignment: Maintained cervical lordosis. Intact craniocervical relationship. Osteoarthritis of atlantodental with joint space narrowing and sclerosis. Skull base and  vertebrae: No fracture of the skull base. No vertebral fracture. Listhesis. Soft tissues and spinal canal: No prevertebral fluid or swelling. No visible canal hematoma. Disc levels: No central canal stenosis or focal disc herniations. There is mild disc space narrowing at C3-4 with moderate to marked disc space narrowing at C6-7. Small posterior marginal osteophytes are seen at C6-7 minimal neural foraminal narrowing bilaterally. Multilevel degenerative facet arthropathy with ankylosed appearance of the C4-5 facets bilaterally. Bilateral C3-4 uncovertebral joint osteoarthritis with uncinate spurring. Upper chest: Limited by respiratory motion. No dominant mass. Scarring at the right lung apex. Bilateral upper lobe hazy opacities right greater left may represent passive congestion, alveolitis or pneumonitis among some possibilities. Other: None IMPRESSION: 1. Near complete resolution previously noted left pontine hemorrhage now measuring approximately 8 x 2 mm in transverse by AP dimension. 2. Atrophy with moderate chronic small vessel ischemia, stable in appearance with chronic left corona radiata lacunar infarct. 3. No acute cervical spine fracture. Degenerative disc disease at C3-4 and C6-7 with multilevel degenerative facet arthropathy and ankylosed appearance of the facets at C4-5 bilaterally. Electronically Signed   By: Tollie Eth M.D.   On: 11/11/2017 21:57   Dg Chest Portable 1 View  Result Date: 11/11/2017 CLINICAL DATA:  Pain following fall EXAM: PORTABLE CHEST 1 VIEW COMPARISON:  November 05, 2017 FINDINGS: Lungs are clear. Heart is mildly enlarged with pulmonary vascularity within normal limits. No adenopathy. There is aortic atherosclerosis. There is evidence of old fracture of the left clavicle with remodeling. IMPRESSION: Stable cardiomegaly. No edema or consolidation. There is aortic atherosclerosis. Aortic Atherosclerosis (ICD10-I70.0). Electronically Signed   By: Bretta Bang III M.D.   On:  11/11/2017 21:19   Dg Shoulder Right Portable  Result Date: 11/11/2017 CLINICAL DATA:  Pain following fall EXAM: PORTABLE RIGHT SHOULDER: 2 V COMPARISON:  None. FINDINGS: Frontal and Y scapular views obtained. No evident fracture or dislocation. There is moderate generalized osteoarthritic change. No erosive change. Visualized right lung clear. IMPRESSION: Generalized osteoarthritic change.  No fracture or dislocation. Electronically Signed   By: Bretta Bang III M.D.   On: 11/11/2017 23:52        Scheduled Meds: . atorvastatin  40 mg Oral q1800  . cholecalciferol  1,000 Units Oral Daily  . diltiazem  90 mg Oral Q8H  . docusate sodium  100 mg Oral BID  . enoxaparin (LOVENOX) injection  40 mg Subcutaneous QHS  . metoprolol tartrate  50 mg Oral BID  . multivitamin with minerals  1 tablet Oral Daily  . pantoprazole  40 mg Oral QHS  . QUEtiapine  12.5 mg Oral BID  . sodium chloride flush  3 mL Intravenous Q12H   Continuous Infusions:   LOS: 2 days    Time spent: 40 minutes     Shanitha Twining, Roselind Messier, MD [facility] Pager [phone]   If 7PM-7AM, please contact night-coverage [email or web address]  Password [email or web address] 11/13/2017, 8:35 AM

## 2017-11-13 NOTE — Progress Notes (Signed)
Progress Note  Patient Name: [name] Date of Encounter: 11/13/2017  Primary Cardiologist: Lesleigh Noe, MD   Subjective   No chest pain or dyspnea  Inpatient Medications    Scheduled Meds: . atorvastatin  40 mg Oral q1800  . cholecalciferol  1,000 Units Oral Daily  . diltiazem  90 mg Oral Q8H  . docusate sodium  100 mg Oral BID  . enoxaparin (LOVENOX) injection  40 mg Subcutaneous QHS  . metoprolol tartrate  50 mg Oral BID  . multivitamin with minerals  1 tablet Oral Daily  . pantoprazole  40 mg Oral QHS  . QUEtiapine  12.5 mg Oral BID  . sodium chloride flush  3 mL Intravenous Q12H   Continuous Infusions:  PRN Meds: LORazepam, RESOURCE THICKENUP CLEAR   Vital Signs    Vitals:   11/13/17 0425 11/13/17 0455 11/13/17 0655 11/13/17 0732  BP: (!) 109/58 119/61 (!) 141/106 100/69  Pulse:    84  Resp:      Temp:    (!) 97.5 F (36.4 C)  TempSrc:    Oral  SpO2: 100% 95%  99%  Weight:      Height:        Intake/Output Summary (Last 24 hours) at 11/13/2017 0824 Last data filed at 11/13/2017 0600 Gross per 24 hour  Intake 565 ml  Output 400 ml  Net 165 ml   Filed Weights   11/12/17 1055 11/13/17 0325  Weight: 155 lb 13.8 oz (70.7 kg) 160 lb 4.4 oz (72.7 kg)    Telemetry    Atrial flutter, rate controlled- Personally Reviewed   Physical Exam   GEN: No acute distress.   Neck: No JVD Cardiac: irrregular Respiratory: Clear to auscultation bilaterally. GI: Soft, nontender, non-distended  MS: No edema; No deformity. Neuro:  hemiparetic   Labs    Chemistry Recent Labs  Lab 11/11/17 2100 11/12/17 0509 11/13/17 0408  NA 141 143 142  K 3.7 3.6 3.5  CL 106 110 107  CO2 20* 21* 25  GLUCOSE 98 109* 121*  BUN 20 18 18   CREATININE 1.19 1.20 1.12  CALCIUM 9.1 8.9 8.7*  PROT  --  6.3*  --   ALBUMIN  --  2.9*  --   AST  --  40  --   ALT  --  41  --   ALKPHOS  --  135*  --   BILITOT  --  1.8*  --   GFRNONAA 54* 53* 58*  GFRAA >60 >60  >60  ANIONGAP 15 12 10      Hematology Recent Labs  Lab 11/11/17 2159 11/12/17 0509 11/13/17 0408  WBC 14.2* 12.8* 11.6*  RBC 4.97 4.53 4.14*  HGB 17.5* 15.7 14.3  HCT 49.3 45.4 42.1  MCV 99.2 100.2* 101.7*  MCH 35.2* 34.7* 34.5*  MCHC 35.5 34.6 34.0  RDW 12.9 13.2 13.5  PLT 230 219 256    Recent Labs  Lab 11/11/17 2024  TROPIPOC 0.00     BNP Recent Labs  Lab 11/11/17 2159  BNP 177.4*     Radiology    Ct Head Wo Contrast  Result Date: 11/11/2017 CLINICAL DATA:  Patient fell out of bed. History of infarct and right-sided paralysis. Head trauma. EXAM: CT HEAD WITHOUT CONTRAST CT CERVICAL SPINE WITHOUT CONTRAST TECHNIQUE: Multidetector CT imaging of the head and cervical spine was performed following the standard protocol without intravenous contrast. Multiplanar CT image reconstructions of the cervical spine were also generated. COMPARISON:  10/27/2017 head CT. FINDINGS: CT HEAD FINDINGS Brain: Chronic left corona radiata lacunar infarct with moderate small vessel ischemic disease and cerebral near complete resolution of previously noted left pontine infarct with only a small focus of residual hyperdensity currently identified measuring approximately 8 x 2 mm. No hydrocephalus. Midline fourth ventricle and basal cisterns without effacement. Vascular: Calcific atherosclerosis of the carotid siphons and both vertebral arteries. No hyperdense vessel sign. Skull: No acute osseous abnormality. Sinuses/Orbits: Intact orbits and globes. Clear paranasal sinuses and mastoids. Other: None CT CERVICAL SPINE FINDINGS Alignment: Maintained cervical lordosis. Intact craniocervical relationship. Osteoarthritis of atlantodental with joint space narrowing and sclerosis. Skull base and vertebrae: No fracture of the skull base. No vertebral fracture. Listhesis. Soft tissues and spinal canal: No prevertebral fluid or swelling. No visible canal hematoma. Disc levels: No central canal stenosis or focal  disc herniations. There is mild disc space narrowing at C3-4 with moderate to marked disc space narrowing at C6-7. Small posterior marginal osteophytes are seen at C6-7 minimal neural foraminal narrowing bilaterally. Multilevel degenerative facet arthropathy with ankylosed appearance of the C4-5 facets bilaterally. Bilateral C3-4 uncovertebral joint osteoarthritis with uncinate spurring. Upper chest: Limited by respiratory motion. No dominant mass. Scarring at the right lung apex. Bilateral upper lobe hazy opacities right greater left may represent passive congestion, alveolitis or pneumonitis among some possibilities. Other: None IMPRESSION: 1. Near complete resolution previously noted left pontine hemorrhage now measuring approximately 8 x 2 mm in transverse by AP dimension. 2. Atrophy with moderate chronic small vessel ischemia, stable in appearance with chronic left corona radiata lacunar infarct. 3. No acute cervical spine fracture. Degenerative disc disease at C3-4 and C6-7 with multilevel degenerative facet arthropathy and ankylosed appearance of the facets at C4-5 bilaterally. Electronically Signed   By: Tollie Ethavid  Kwon M.D.   On: 11/11/2017 21:57   Ct Cervical Spine Wo Contrast  Result Date: 11/11/2017 CLINICAL DATA:  Patient fell out of bed. History of infarct and right-sided paralysis. Head trauma. EXAM: CT HEAD WITHOUT CONTRAST CT CERVICAL SPINE WITHOUT CONTRAST TECHNIQUE: Multidetector CT imaging of the head and cervical spine was performed following the standard protocol without intravenous contrast. Multiplanar CT image reconstructions of the cervical spine were also generated. COMPARISON:  10/27/2017 head CT. FINDINGS: CT HEAD FINDINGS Brain: Chronic left corona radiata lacunar infarct with moderate small vessel ischemic disease and cerebral near complete resolution of previously noted left pontine infarct with only a small focus of residual hyperdensity currently identified measuring approximately 8  x 2 mm. No hydrocephalus. Midline fourth ventricle and basal cisterns without effacement. Vascular: Calcific atherosclerosis of the carotid siphons and both vertebral arteries. No hyperdense vessel sign. Skull: No acute osseous abnormality. Sinuses/Orbits: Intact orbits and globes. Clear paranasal sinuses and mastoids. Other: None CT CERVICAL SPINE FINDINGS Alignment: Maintained cervical lordosis. Intact craniocervical relationship. Osteoarthritis of atlantodental with joint space narrowing and sclerosis. Skull base and vertebrae: No fracture of the skull base. No vertebral fracture. Listhesis. Soft tissues and spinal canal: No prevertebral fluid or swelling. No visible canal hematoma. Disc levels: No central canal stenosis or focal disc herniations. There is mild disc space narrowing at C3-4 with moderate to marked disc space narrowing at C6-7. Small posterior marginal osteophytes are seen at C6-7 minimal neural foraminal narrowing bilaterally. Multilevel degenerative facet arthropathy with ankylosed appearance of the C4-5 facets bilaterally. Bilateral C3-4 uncovertebral joint osteoarthritis with uncinate spurring. Upper chest: Limited by respiratory motion. No dominant mass. Scarring at the right lung apex. Bilateral upper lobe hazy  opacities right greater left may represent passive congestion, alveolitis or pneumonitis among some possibilities. Other: None IMPRESSION: 1. Near complete resolution previously noted left pontine hemorrhage now measuring approximately 8 x 2 mm in transverse by AP dimension. 2. Atrophy with moderate chronic small vessel ischemia, stable in appearance with chronic left corona radiata lacunar infarct. 3. No acute cervical spine fracture. Degenerative disc disease at C3-4 and C6-7 with multilevel degenerative facet arthropathy and ankylosed appearance of the facets at C4-5 bilaterally. Electronically Signed   By: Tollie Eth M.D.   On: 11/11/2017 21:57   Dg Chest Portable 1  View  Result Date: 11/11/2017 CLINICAL DATA:  Pain following fall EXAM: PORTABLE CHEST 1 VIEW COMPARISON:  November 05, 2017 FINDINGS: Lungs are clear. Heart is mildly enlarged with pulmonary vascularity within normal limits. No adenopathy. There is aortic atherosclerosis. There is evidence of old fracture of the left clavicle with remodeling. IMPRESSION: Stable cardiomegaly. No edema or consolidation. There is aortic atherosclerosis. Aortic Atherosclerosis (ICD10-I70.0). Electronically Signed   By: Bretta Bang III M.D.   On: 11/11/2017 21:19   Dg Shoulder Right Portable  Result Date: 11/11/2017 CLINICAL DATA:  Pain following fall EXAM: PORTABLE RIGHT SHOULDER: 2 V COMPARISON:  None. FINDINGS: Frontal and Y scapular views obtained. No evident fracture or dislocation. There is moderate generalized osteoarthritic change. No erosive change. Visualized right lung clear. IMPRESSION: Generalized osteoarthritic change.  No fracture or dislocation. Electronically Signed   By: Bretta Bang III M.D.   On: 11/11/2017 23:52    Patient Profile     84 year old male with past medical history of atrial fibrillation/flutter, recent hemorrhagic CVA, hypertension, chronic diastolic congestive heart failure, hyperlipidemia for evaluation of atrial flutter with rapid ventricular response.  Assessment & Plan    1 atrial flutter-patient's heart rate is intermittently elevated.  Would continue Cardizem 90 mg every 8 hours.  This can be increased to 120 mg every 8 hours if needed.  Continue metoprolol 50 mg twice daily.  Follow heart rate today and if reasonably well controlled patient can be discharged and follow-up with Dr. Katrinka Blazing.  As outlined previously he is not on anticoagulation because of recent intracranial hemorrhage.    2 hypertension-blood pressure is controlled; continue present meds.  3 hyperlipidemia-continue lipitor.    For questions or updates, please contact [facility] Please consult  [email or web address] for contact info under Cardiology/STEMI.      Signed, Olga Millers, MD  11/13/2017, 8:24 AM

## 2017-11-13 NOTE — NC FL2 (Addendum)
[place] MEDICAID FL2 LEVEL OF CARE SCREENING TOOL     IDENTIFICATION  Patient Name: [name] R Handler Birthdate: 11/14/1929 Sex: male Admission Date (Current Location): 11/11/2017  [facility] [place]   Facility and Address:  [facility], [address], [place], [place] [address]      Provider Number: [id number]  Attending Physician Name and Address:  Drema DallasWoods, Curtis J, MD  Relative Name and Phone Number:  Lincoln MaxinRuss Bojarski, son, [phone]    Current Level of Care: Hospital Recommended Level of Care: Skilled Nursing Facility Prior Approval Number:    Date Approved/Denied:   PASRR Number: [id number]  Discharge Plan: SNF    Current Diagnoses: Patient Active Problem List   Diagnosis Date Noted  . Atrial fibrillation with rapid ventricular response (HCC) 11/11/2017  . Dysphagia following intracerebral hemorrhage 11/07/2017  . Right hemiplegia (HCC) d/t ICH 11/07/2017  . CHF (congestive heart failure) (HCC)   . Atrial flutter with rapid ventricular response (HCC)   . Leukocytosis   . L Pontine ICH (intracerebral hemorrhage) (HCC) 10/27/2017  . Chronic anticoagulation 11/19/2016  . Right bundle branch block 09/08/2014  . Encounter for therapeutic drug monitoring 11/24/2013  . Atrial fibrillation with RVR (HCC) 06/30/2013  . Weight loss 06/03/2013    Class: Chronic  . Hyperlipidemia   . Essential hypertension 03/17/2009  . Atrial flutter (HCC) 03/17/2009  . Other specified cardiac dysrhythmias(427.89) 03/17/2009  . EDEMA 03/17/2009    Orientation RESPIRATION BLADDER Height & Weight     Self, Situation, Place  Normal Incontinent, External catheter Weight: 160 lb 4.4 oz (72.7 kg) Height:  5\' 10"  (177.8 cm)  BEHAVIORAL SYMPTOMS/MOOD NEUROLOGICAL BOWEL NUTRITION STATUS      Incontinent Diet(please see DC summary)  AMBULATORY STATUS COMMUNICATION OF NEEDS Skin   Extensive Assist Verbally Normal                        Personal Care Assistance Level of Assistance  Bathing Bathing Assistance: Maximum assistance Feeding assistance: Limited assistance Dressing Assistance: Maximum assistance     Functional Limitations Info  Sight, Hearing, Speech Sight Info: Adequate Hearing Info: Adequate Speech Info: Impaired    SPECIAL CARE FACTORS FREQUENCY  PT (By licensed PT), OT (By licensed OT), Speech therapy     PT Frequency: 5x/week OT Frequency: 5x/week     Speech Therapy Frequency: 5x/week      Contractures Contractures Info: Not present    Additional Factors Info  Code Status, Allergies, Psychotropic Code Status Info: Full Allergies Info: No Known Allergies Psychotropic Info: seroquel         Current Medications (11/13/2017):  This is the current hospital active medication list Current Facility-Administered Medications  Medication Dose Route Frequency Provider Last Rate Last Dose  . atorvastatin (LIPITOR) tablet 40 mg  40 mg Oral q1800 Elder LoveKuhlman, Patrick D, MD   40 mg at 11/12/17 1751  . cholecalciferol (VITAMIN D) tablet 1,000 Units  1,000 Units Oral Daily Elder LoveKuhlman, Patrick D, MD   1,000 Units at 11/13/17 [phone]  . diltiazem (CARDIZEM) tablet 90 mg  90 mg Oral Q8H Marcelino DusterDuke, Angela Nicole, [place]   90 mg at 11/13/17 0655  . docusate sodium (COLACE) capsule 100 mg  100 mg Oral BID Elder LoveKuhlman, Patrick D, MD   100 mg at 11/12/17 2107  . enoxaparin (LOVENOX) injection 40 mg  40 mg Subcutaneous QHS Lonia BloodMcClung, Jeffrey T, MD   40 mg at 11/12/17 2109  . LORazepam (  ATIVAN) injection 0.5 mg  0.5 mg Intravenous Q4H PRN Elder Love, MD   0.5 mg at 11/12/17 0036  . metoprolol tartrate (LOPRESSOR) tablet 50 mg  50 mg Oral BID Marcelino Duster, PA   50 mg at 11/13/17 0843  . multivitamin with minerals tablet 1 tablet  1 tablet Oral Daily Elder Love, MD   1 tablet at 11/13/17 [phone]  . pantoprazole (PROTONIX) EC tablet 40 mg  40 mg Oral QHS Elder Love, MD   40 mg at 11/12/17 2108  . QUEtiapine  (SEROQUEL) tablet 12.5 mg  12.5 mg Oral BID Elder Love, MD   12.5 mg at 11/13/17 0844  . RESOURCE THICKENUP CLEAR   Oral PRN Jetty Duhamel T, MD      . sodium chloride flush (NS) 0.9 % injection 3 mL  3 mL Intravenous Q12H Elder Love, MD   3 mL at 11/12/17 2112     Discharge Medications: Please see discharge summary for a list of discharge medications.  Relevant Imaging Results:  Relevant Lab Results:   Additional Information SSN: [id number]  Abigail Butts, [name]

## 2017-11-14 DIAGNOSIS — I4891 Unspecified atrial fibrillation: Secondary | ICD-10-CM

## 2017-11-14 DIAGNOSIS — E7849 Other hyperlipidemia: Secondary | ICD-10-CM

## 2017-11-14 LAB — LIPID PANEL
CHOL/HDL RATIO: 3.7 ratio
Cholesterol: 123 mg/dL (ref 0–200)
HDL: 33 mg/dL — ABNORMAL LOW (ref >40)
LDL Cholesterol: 74 mg/dL (ref 0–99)
Triglycerides: 82 mg/dL (ref <150)
VLDL: 16 mg/dL (ref 0–40)

## 2017-11-14 LAB — BASIC METABOLIC PANEL
Anion gap: 10 (ref 5–15)
BUN: 16 mg/dL (ref 6–20)
CALCIUM: 8.6 mg/dL — AB (ref 8.9–10.3)
CO2: 25 mmol/L (ref 22–32)
Chloride: 107 mmol/L (ref 101–111)
Creatinine, Ser: 1.11 mg/dL (ref 0.61–1.24)
GFR calc non Af Amer: 59 mL/min — ABNORMAL LOW (ref >60)
Glucose, Bld: 115 mg/dL — ABNORMAL HIGH (ref 65–99)
POTASSIUM: 3.3 mmol/L — AB (ref 3.5–5.1)
Sodium: 142 mmol/L (ref 135–145)

## 2017-11-14 LAB — CBC
HCT: 45.1 % (ref 39.0–52.0)
HEMOGLOBIN: 15.1 g/dL (ref 13.0–17.0)
MCH: 34.2 pg — ABNORMAL HIGH (ref 26.0–34.0)
MCHC: 33.5 g/dL (ref 30.0–36.0)
MCV: 102 fL — AB (ref 78.0–100.0)
PLATELETS: 284 10*3/uL (ref 150–400)
RBC: 4.42 MIL/uL (ref 4.22–5.81)
RDW: 13.5 % (ref 11.5–15.5)
WBC: 9.9 10*3/uL (ref 4.0–10.5)

## 2017-11-14 LAB — MAGNESIUM: Magnesium: 2 mg/dL (ref 1.7–2.4)

## 2017-11-14 MED ORDER — POTASSIUM CHLORIDE CRYS ER 20 MEQ PO TBCR
60.0000 meq | EXTENDED_RELEASE_TABLET | Freq: Once | ORAL | Status: AC
  Administered 2017-11-14: 60 meq via ORAL
  Filled 2017-11-14: qty 3

## 2017-11-14 MED ORDER — ENSURE ENLIVE PO LIQD: 237.0000 mL | Freq: Two times a day (BID) | ORAL | Status: DC

## 2017-11-14 MED ORDER — ATORVASTATIN CALCIUM 40 MG PO TABS
60.0000 mg | ORAL_TABLET | Freq: Every day | ORAL | Status: DC
  Administered 2017-11-15: 60 mg via ORAL
  Filled 2017-11-14: qty 1

## 2017-11-14 NOTE — Progress Notes (Signed)
PROGRESS NOTE    [name]  UUV:[id number] DOB: [birth year] DOA: 11/11/2017 PCP: Patient, No Pcp Per   Brief Narrative:  84 year old WM PMHx Atrial Fibrillation/A -Flutter, HTN, HLD, recent Hemorrhagic CVA of the left pons with resultant right sided facial droop, expressive dysarthria, dysphagia requiring dysphagia nectar thick liquid liquid diet, and right sided hemiparesis who was discharged to skilled nursing facility to optimize rehabilitation efforts on 11/07/2017.   Suffered an observe mechanical fall the day of admission, due to concerns about trauma he was transported to [facility].   History is limited due to the patient's underlying dysarthria, he is able to deny being any pain, having any chest pain, or shortness of breath.  His daughter is reachable by phone and reports that he has had challenges with his thickened liquid diet, sites that his by mouth intake has been very poor. She relays that he formerly worked at a garden center office, before his stroke earlier this month he was living independently alone, frequently working outdoors in his garden. She relays that his mother died of aortic aneurysm and that his father died of complications of lead poisoning.   Upon review of cardiac monitoring tracings from EMS, appears to my a flutter with 2 to one conduction, heart rate of 150.   ED Course: in emergency department vital signs remarkable for tachycardia with heart rate ranging from 120s to 160, systolic blood pressure over 100. CBC reveals elevated hemoglobin of 17.5, and leukocytosis to 14.2.  Creatinine of 1.2. BNP 177. Troponin undetectable.  EKG reveals tachycardia arrhythmia with rate in the 130s.  CT imaging of the cervical spine and head revealed near complete resolution of prior left pontine hemorrhage, as well as atrophy with moderate chronic small vessel ischemia stable in appearance. No acute cervical spine fracture. Chest x-ray without acute findings.  Patient  was given 500 mL of normal saline, as well as IV diltiazem 2 without improvement in the heart rate, therefore was placed on a diltiazem drip.  Hospital medicine consulted for further management.   Review of Systems: A complete ROS was obtained; pertinent positives negatives are denoted in the HPI. Otherwise, all systems are negative.     Subjective: 3/20 A/O 4, states does  Want to speak about a PEG tube States wants to lose weight. Negative CP. Negative SOB. Negative abdominal pain.    Assessment & Plan:   Active Problems:   Atrial fibrillation with rapid ventricular response (HCC)  Atrial flutter/A. Fib with RVR -per cardiology:  Cardizem 90 mg TID  Metoprolol 50 mg BID   Follow-up with Dr. Katrinka Blazing Cardiology; Clear for discharge from cardiology standpoint -Off anticoagulation secondary to hemorrhagic CVA 10/2017     Chronic Diastolic CHF -strict in and out since admission +996.86ml  -Daily weight Filed Weights   11/12/17 1055 11/13/17 0325 11/14/17 0700  Weight: 155 lb 13.8 oz (70.7 kg) 160 lb 4.4 oz (72.7 kg) 172 lb 13.5 oz (78.4 kg)    Hypokalemia -Potassium goal> 4 -K-Dur 60 mEq  Essential Hypertension -See A flutter   Dehydration -D5-0.9% saline45ml/hr  Large LEFT pons hemorrhagic CVA 10/27/2017 -Discharged 11/07/17  -right hemiparesis, will need to return to SNF    Dysphagia  -dysphagia 1 diet fluid consistency nectar thick -not convinced patient will be able to take adequate amount of nutrition PO, start calorie count. May require PEG tube placement  -3/20 patient seems very resistant to the idea of PEG tube placement, may have to involve family members,  as I don't believe patient is taking adequate nutritional intake but will await findings of calorie count on 3/21   HLD -not within AHA guideline -LDL goal<70 -3/20 increase Lipitor 60 mg daily       DVT prophylaxis: SCD Code Status: dO NOT RESUSCITATE Family Communication: None Disposition Plan:  SNF   Consultants:  Cardiology   Procedures/Significant Events:     I have personally reviewed and interpreted all radiology studies and my findings are as above.  VENTILATOR SETTINGS:    Cultures   Antimicrobials:    Devices    LINES / TUBES:      Continuous Infusions: . dextrose 5 % and 0.9% NaCl 75 mL/hr at 11/14/17 1135     Objective: Vitals:   11/14/17 0553 11/14/17 0700 11/14/17 1159 11/14/17 1624  BP: (!) 141/98 (!) 108/59  129/60  Pulse: (!) 134 88    Resp:      Temp:  98.4 F (36.9 C) 97.6 F (36.4 C) 98 F (36.7 C)  TempSrc:  Axillary Axillary Axillary  SpO2:      Weight:  172 lb 13.5 oz (78.4 kg)    Height:        Intake/Output Summary (Last 24 hours) at 11/14/2017 1706 Last data filed at 11/14/2017 1626 Gross per 24 hour  Intake 1231.25 ml  Output 1200 ml  Net 31.25 ml   Filed Weights   11/12/17 1055 11/13/17 0325 11/14/17 0700  Weight: 155 lb 13.8 oz (70.7 kg) 160 lb 4.4 oz (72.7 kg) 172 lb 13.5 oz (78.4 kg)    Physical Exam:  General: A/O 4,No acute respiratory distress Neck:  Negative scars, masses, torticollis, lymphadenopathy, JVD Lungs: Clear to auscultation bilaterally without wheezes or crackles Cardiovascular: Regular rate and rhythm without murmur gallop or rub normal S1 and S2 Abdomen: negative abdominal pain, nondistended, positive soft, bowel sounds, no rebound, no ascites, no appreciable mass Extremities: No significant cyanosis, clubbing, or edema bilateral lower extremities Skin: Negative rashes, lesions, ulcers Psychiatric:  Negative depression, negative anxiety, negative fatigue, negative mania  Central nervous system:  Cranial nerves II through XII intact, tongue/uvula midline, LEFT extremities muscle strength 5/5,RIGHT hemiparesis, sensation intact throughout,  positive dysarthria, negative expressive aphasia, negative receptive aphasia.       Data Reviewed: Care during the described time interval was  provided by me .  I have reviewed this patient's available data, including medical history, events of note, physical examination, and all test results as part of my evaluation.   CBC: Recent Labs  Lab 11/11/17 2159 11/12/17 0509 11/13/17 0408 11/14/17 0344  WBC 14.2* 12.8* 11.6* 9.9  HGB 17.5* 15.7 14.3 15.1  HCT 49.3 45.4 42.1 45.1  MCV 99.2 100.2* 101.7* 102.0*  PLT 230 219 256 284   Basic Metabolic Panel: Recent Labs  Lab 11/11/17 2100 11/12/17 0509 11/13/17 0408 11/14/17 0344  NA 141 143 142 142  K 3.7 3.6 3.5 3.3*  CL 106 110 107 107  CO2 20* 21* 25 25  GLUCOSE 98 109* 121* 115*  BUN 20 18 18 16   CREATININE 1.19 1.20 1.12 1.11  CALCIUM 9.1 8.9 8.7* 8.6*  MG 2.0  --   --  2.0   GFR: Estimated Creatinine Clearance: 50.2 mL/min (by C-G formula based on SCr of 1.11 mg/dL). Liver Function Tests: Recent Labs  Lab 11/12/17 0509  AST 40  ALT 41  ALKPHOS 135*  BILITOT 1.8*  PROT 6.3*  ALBUMIN 2.9*   No results for input(s): LIPASE, AMYLASE  in the last 168 hours. No results for input(s): AMMONIA in the last 168 hours. Coagulation Profile: Recent Labs  Lab 11/12/17 0509  INR 1.30   Cardiac Enzymes: No results for input(s): CKTOTAL, CKMB, CKMBINDEX, TROPONINI in the last 168 hours. BNP (last 3 results) No results for input(s): PROBNP in the last 8760 hours. HbA1C: No results for input(s): HGBA1C in the last 72 hours. CBG: No results for input(s): GLUCAP in the last 168 hours. Lipid Profile: Recent Labs    11/14/17 0344  CHOL 123  HDL 33*  LDLCALC 74  TRIG 82  CHOLHDL 3.7   Thyroid Function Tests: Recent Labs    11/11/17 2159  TSH 0.915  FREET4 1.28*   Anemia Panel: No results for input(s): VITAMINB12, FOLATE, FERRITIN, TIBC, IRON, RETICCTPCT in the last 72 hours. Urine analysis:    Component Value Date/Time   COLORURINE YELLOW 11/05/2017 0331   APPEARANCEUR CLEAR 11/05/2017 0331   LABSPEC 1.009 11/05/2017 0331   PHURINE 8.0 11/05/2017  0331   GLUCOSEU NEGATIVE 11/05/2017 0331   HGBUR SMALL (A) 11/05/2017 0331   BILIRUBINUR NEGATIVE 11/05/2017 0331   KETONESUR NEGATIVE 11/05/2017 0331   PROTEINUR 30 (A) 11/05/2017 0331   NITRITE NEGATIVE 11/05/2017 0331   LEUKOCYTESUR NEGATIVE 11/05/2017 0331   Sepsis Labs: @LABRCNTIP (procalcitonin:4,lacticidven:4)  )No results found for this or any previous visit (from the past 240 hour(s)).       Radiology Studies: No results found.      Scheduled Meds: . atorvastatin  40 mg Oral q1800  . cholecalciferol  1,000 Units Oral Daily  . diltiazem  90 mg Oral Q8H  . docusate sodium  100 mg Oral BID  . enoxaparin (LOVENOX) injection  40 mg Subcutaneous QHS  . feeding supplement (ENSURE ENLIVE)  237 mL Oral BID BM  . metoprolol tartrate  50 mg Oral BID  . multivitamin with minerals  1 tablet Oral Daily  . pantoprazole  40 mg Oral QHS  . potassium chloride  60 mEq Oral Once  . QUEtiapine  12.5 mg Oral BID  . sodium chloride flush  3 mL Intravenous Q12H   Continuous Infusions: . dextrose 5 % and 0.9% NaCl 75 mL/hr at 11/14/17 1135     LOS: 3 days    Time spent: 40 minutes     Emeric Novinger, Roselind Messier, MD [facility] Pager [phone]   If 7PM-7AM, please contact night-coverage [email or web address] Password [facility] 11/14/2017, 5:06 PM

## 2017-11-14 NOTE — Progress Notes (Signed)
Progress Note  Patient Name: [name] Date of Encounter: 11/14/2017  Primary Cardiologist: Lesleigh Noe, MD   Subjective   No CP or dyspnea  Inpatient Medications    Scheduled Meds: . atorvastatin  40 mg Oral q1800  . cholecalciferol  1,000 Units Oral Daily  . diltiazem  90 mg Oral Q8H  . docusate sodium  100 mg Oral BID  . enoxaparin (LOVENOX) injection  40 mg Subcutaneous QHS  . metoprolol tartrate  50 mg Oral BID  . multivitamin with minerals  1 tablet Oral Daily  . pantoprazole  40 mg Oral QHS  . QUEtiapine  12.5 mg Oral BID  . sodium chloride flush  3 mL Intravenous Q12H   Continuous Infusions: . dextrose 5 % and 0.9% NaCl 75 mL/hr at 11/13/17 2059   PRN Meds: LORazepam, RESOURCE THICKENUP CLEAR   Vital Signs    Vitals:   11/14/17 0335 11/14/17 0340 11/14/17 0553 11/14/17 0700  BP:  124/89 (!) 141/98 (!) 108/59  Pulse:   (!) 134 88  Resp: 17     Temp:    98.4 F (36.9 C)  TempSrc:    Axillary  SpO2:      Weight:    172 lb 13.5 oz (78.4 kg)  Height:        Intake/Output Summary (Last 24 hours) at 11/14/2017 0929 Last data filed at 11/14/2017 1610 Gross per 24 hour  Intake 1291.25 ml  Output 900 ml  Net 391.25 ml   Filed Weights   11/12/17 1055 11/13/17 0325 11/14/17 0700  Weight: 155 lb 13.8 oz (70.7 kg) 160 lb 4.4 oz (72.7 kg) 172 lb 13.5 oz (78.4 kg)    Telemetry    Atrial flutter, rate mildy elevated early am but controlled at present- Personally Reviewed   Physical Exam   GEN: WD chronically ill appearing No acute distress.   Neck: No JVD, supple Cardiac: irregular Respiratory: CTA GI: Soft, nontender, non-distended, no masses MS: No edema Neuro:  hemiparetic from prior hemorrhagic CVA   Labs    Chemistry Recent Labs  Lab 11/12/17 0509 11/13/17 0408 11/14/17 0344  NA 143 142 142  K 3.6 3.5 3.3*  CL 110 107 107  CO2 21* 25 25  GLUCOSE 109* 121* 115*  BUN 18 18 16   CREATININE 1.20 1.12 1.11  CALCIUM 8.9 8.7*  8.6*  PROT 6.3*  --   --   ALBUMIN 2.9*  --   --   AST 40  --   --   ALT 41  --   --   ALKPHOS 135*  --   --   BILITOT 1.8*  --   --   GFRNONAA 53* 58* 59*  GFRAA >60 >60 >60  ANIONGAP 12 10 10      Hematology Recent Labs  Lab 11/12/17 0509 11/13/17 0408 11/14/17 0344  WBC 12.8* 11.6* 9.9  RBC 4.53 4.14* 4.42  HGB 15.7 14.3 15.1  HCT 45.4 42.1 45.1  MCV 100.2* 101.7* 102.0*  MCH 34.7* 34.5* 34.2*  MCHC 34.6 34.0 33.5  RDW 13.2 13.5 13.5  PLT 219 256 284    Recent Labs  Lab 11/11/17 2024  TROPIPOC 0.00     BNP Recent Labs  Lab 11/11/17 2159  BNP 177.4*     Radiology    No results found.  Patient Profile     84 year old male with past medical history of atrial fibrillation/flutter, recent hemorrhagic CVA, hypertension, chronic diastolic congestive heart failure,  hyperlipidemia for evaluation of atrial flutter with rapid ventricular response.  Assessment & Plan    1 atrial flutter-patient's heart rate was mildly elevated earlier but controlled at present.  Would continue Cardizem 90 mg every 8 hours and metoprolol 50 mg twice daily.  Pt can be DCed; may need outpt holter to reassess HR; fu with Dr Katrinka BlazingSmith following DC. As outlined previously he is not on anticoagulation because of recent intracranial hemorrhage.    2 hypertension-blood pressure is controlled; continue present meds.  3 hyperlipidemia-continue lipitor.    For questions or updates, please contact [facility] Please consult [email or web address] for contact info under Cardiology/STEMI.      Signed, Olga MillersBrian Katrina Daddona, MD  11/14/2017, 9:29 AM

## 2017-11-14 NOTE — Progress Notes (Signed)
Initial Nutrition Assessment  DOCUMENTATION CODES:   Not applicable  INTERVENTION:   Ensure Enlive po BID, each supplement provides 350kcals and 20g of protein. Thickened to nectar consistency  Continue MVI  Will assess day 1 of calorie count in 24 hours.  NUTRITION DIAGNOSIS:   Inadequate oral intake related to dysphagia as evidenced by other (comment)(meal completion 50-75%).  GOAL:   Patient will meet greater than or equal to 90% of their needs  MONITOR:   PO intake, Supplement acceptance, Diet advancement, I & O's  REASON FOR ASSESSMENT:   Consult Calorie Count  ASSESSMENT:   84yo male with PMH a-fib, HTN, HLD, CHF, recent Hemorrhagic CVA with resultant right sided hemiparesis and expressive dysarthria, requiring dysphagia I/nectar thick liquid liquid. Pt d/c to SNF for rehabilitation; s/p fall on day of admission.   3/19 48hr Calorie Count Ordered; concern for PEG placement if intake is inadequate.  Per RN report, pt is eating 50-75% of meals. Feels intake could be adequate with supplements vs PEG.  Discussed with RN calorie count has been ordered.  No bowel movements recorded this admission; RN reports pt had one yesterday.   Pt denies stomach pain and N/V. Pt says he ate good a breakfast.  Per chart review pt has gained 5lb since 3/13. Overall, wt appears to be stable, given potential fluctuations in fluid due to CHF. Wt hx outlined below.  Medications: Vitamin D, colace, MVI, protonix, D5 IVF @ 5275ml/hr.  Net positive 1.2L since admit  Labs: K(L;3.3).  NUTRITION - FOCUSED PHYSICAL EXAM:    Most Recent Value  Orbital Region  Mild depletion  Upper Arm Region  No depletion  Thoracic and Lumbar Region  No depletion  Buccal Region  Mild depletion  Temple Region  Moderate depletion  Clavicle Bone Region  Mild depletion  Clavicle and Acromion Bone Region  Moderate depletion  Scapular Bone Region  Moderate depletion  Dorsal Hand  Mild depletion   Anterior Thigh Region  Moderate depletion  Posterior Calf Region  Mild depletion  Edema (RD Assessment)  None  Hair  Reviewed  Eyes  Reviewed  Mouth  Reviewed  Skin  Reviewed  Nails  Reviewed     Pt is not ambulatory since CVA. Suspect muscle depletions due to advanced age.  Diet Order:  DIET - DYS 1 Room service appropriate? Yes; Fluid consistency: Nectar Thick  EDUCATION NEEDS:   No education needs have been identified at this time  Skin:  Skin Assessment: Reviewed RN Assessment  Last BM:  PTA  Height:   Ht Readings from Last 1 Encounters:  11/12/17 5\' 10"  (1.778 m)    Weight:   Wt Readings from Last 15 Encounters:  11/14/17 172 lb 13.5 oz (78.4 kg)  11/07/17 166 lb 7.2 oz (75.5 kg)  11/20/16 172 lb (78 kg)  11/01/15 167 lb 12.8 oz (76.1 kg)  09/08/14 178 lb (80.7 kg)  06/03/13 174 lb (78.9 kg)    Ideal Body Weight:  75.45 kg  BMI:  Body mass index is 24.8 kg/m.  Estimated Nutritional Needs:   Kcal:  1750-1950  Protein:  95-105g  Fluid:  >1.7L    Alexander Masoner, MS, Dietetic Intern Pager # [phone]

## 2017-11-14 NOTE — Progress Notes (Signed)
CSW spoke to admissions at patient's facility, [facility]. They continue to await [employer] authorization for patient to return. CSW noted plan for possible evaluation for PEG. CSW to follow for discharge planning.   Abigail ButtsSusan Rupa Lagan, LCSWA [phone]

## 2017-11-15 ENCOUNTER — Encounter (HOSPITAL_COMMUNITY): Payer: Self-pay

## 2017-11-15 LAB — CBC
HCT: 46.3 % (ref 39.0–52.0)
HEMOGLOBIN: 15.6 g/dL (ref 13.0–17.0)
MCH: 34.1 pg — ABNORMAL HIGH (ref 26.0–34.0)
MCHC: 33.7 g/dL (ref 30.0–36.0)
MCV: 101.3 fL — AB (ref 78.0–100.0)
Platelets: 308 10*3/uL (ref 150–400)
RBC: 4.57 MIL/uL (ref 4.22–5.81)
RDW: 13.2 % (ref 11.5–15.5)
WBC: 10 10*3/uL (ref 4.0–10.5)

## 2017-11-15 LAB — BASIC METABOLIC PANEL
ANION GAP: 10 (ref 5–15)
BUN: 11 mg/dL (ref 6–20)
CALCIUM: 8.6 mg/dL — AB (ref 8.9–10.3)
CO2: 25 mmol/L (ref 22–32)
CREATININE: 1.09 mg/dL (ref 0.61–1.24)
Chloride: 107 mmol/L (ref 101–111)
GFR, EST NON AFRICAN AMERICAN: 60 mL/min — AB (ref >60)
Glucose, Bld: 110 mg/dL — ABNORMAL HIGH (ref 65–99)
Potassium: 3.8 mmol/L (ref 3.5–5.1)
Sodium: 142 mmol/L (ref 135–145)

## 2017-11-15 LAB — MAGNESIUM: MAGNESIUM: 1.9 mg/dL (ref 1.7–2.4)

## 2017-11-15 NOTE — Progress Notes (Addendum)
PROGRESS NOTE    [name]  ZOX:[id number] DOB: 1930-02-02 DOA: 11/11/2017 PCP: Patient, No Pcp Per   Brief Narrative:  84 year old WM PMHx Atrial Fibrillation/A -Flutter, HTN, HLD, recent Hemorrhagic CVA of the left pons with resultant right sided facial droop, expressive dysarthria, dysphagia requiring dysphagia nectar thick liquid liquid diet, and right sided hemiparesis who was discharged to skilled nursing facility to optimize rehabilitation efforts on 11/07/2017.   Suffered an observe mechanical fall the day of admission, due to concerns about trauma he was transported to [facility].   History is limited due to the patient's underlying dysarthria, he is able to deny being any pain, having any chest pain, or shortness of breath.  His daughter is reachable by phone and reports that he has had challenges with his thickened liquid diet, sites that his by mouth intake has been very poor. She relays that he formerly worked at a garden center office, before his stroke earlier this month he was living independently alone, frequently working outdoors in his garden. She relays that his mother died of aortic aneurysm and that his father died of complications of lead poisoning.   Upon review of cardiac monitoring tracings from EMS, appears to my a flutter with 2 to one conduction, heart rate of 150.   ED Course: in emergency department vital signs remarkable for tachycardia with heart rate ranging from 120s to 160, systolic blood pressure over 100. CBC reveals elevated hemoglobin of 17.5, and leukocytosis to 14.2.  Creatinine of 1.2. BNP 177. Troponin undetectable.  EKG reveals tachycardia arrhythmia with rate in the 130s.  CT imaging of the cervical spine and head revealed near complete resolution of prior left pontine hemorrhage, as well as atrophy with moderate chronic small vessel ischemia stable in appearance. No acute cervical spine fracture. Chest x-ray without acute findings.  Patient  was given 500 mL of normal saline, as well as IV diltiazem 2 without improvement in the heart rate, therefore was placed on a diltiazem drip.  Hospital medicine consulted for further management.   Review of Systems: A complete ROS was obtained; pertinent positives negatives are denoted in the HPI. Otherwise, all systems are negative.     Subjective: 3/21/ A/O x4, sitting in chair comfortably.  Follows all commands.  Negative CP, negative S OB, negative abdominal pain.  Extremely happy do not have to place PEG tube       Assessment & Plan:   Active Problems:   Atrial fibrillation with rapid ventricular response (HCC)  Atrial flutter/A. Fib with RVR -per cardiology:  Cardizem 90 mg TID  Metoprolol 50 mg BID   Follow-up with Dr. Katrinka BlazingSmith Cardiology; Clear for discharge from cardiology standpoint -Off anticoagulation secondary to hemorrhagic CVA 10/2017     Chronic Diastolic CHF -strict in and out since admission +996.383ml  -Daily weight Filed Weights   11/13/17 0325 11/14/17 0700 11/15/17 0351  Weight: 160 lb 4.4 oz (72.7 kg) 172 lb 13.5 oz (78.4 kg) 144 lb 2.9 oz (65.4 kg)    Hypokalemia -Potassium goal> 4  Essential Hypertension -See A flutter   Dehydration -D5-0.9% saline4075ml/hr  Large LEFT pons hemorrhagic CVA 10/27/2017 -Discharged 11/07/17  -right hemiparesis, will need to return to SNF    Dysphagia  -dysphagia 1 diet fluid consistency nectar thick -not convinced patient will be able to take adequate amount of nutrition PO, start calorie count. May require PEG tube placement  -3/20 patient seems very resistant to the idea of PEG tube placement, may  have to involve family members, as I don't believe patient is taking adequate nutritional intake but will await findings of calorie count on 3/21   HLD -not within AHA guideline -LDL goal<70 -3/20 increase Lipitor 60 mg daily       DVT prophylaxis: SCD Code Status: dO NOT RESUSCITATE Family Communication: Family at  bedside for discussion of plan of care Disposition Plan: Awaiting insurance clearance: SNF   Consultants:  Cardiology   Procedures/Significant Events:     I have personally reviewed and interpreted all radiology studies and my findings are as above.  VENTILATOR SETTINGS:    Cultures   Antimicrobials:    Devices    LINES / TUBES:      Continuous Infusions: . dextrose 5 % and 0.9% NaCl 75 mL/hr at 11/15/17 0012     Objective: Vitals:   11/14/17 1624 11/14/17 2005 11/15/17 0031 11/15/17 0351  BP: 129/60 (!) 146/69 128/86 (!) 145/99  Pulse:  89 65 (!) 124  Resp:  18 (!) 21 20  Temp: 98 F (36.7 C) 98 F (36.7 C) 98.2 F (36.8 C) 97.8 F (36.6 C)  TempSrc: Axillary Axillary Axillary Axillary  SpO2:  96% 97% 97%  Weight:    144 lb 2.9 oz (65.4 kg)  Height:        Intake/Output Summary (Last 24 hours) at 11/15/2017 0910 Last data filed at 11/15/2017 1027 Gross per 24 hour  Intake 1530 ml  Output 1000 ml  Net 530 ml   Filed Weights   11/13/17 0325 11/14/17 0700 11/15/17 0351  Weight: 160 lb 4.4 oz (72.7 kg) 172 lb 13.5 oz (78.4 kg) 144 lb 2.9 oz (65.4 kg)    Physical Exam:  General: A/O 4,No acute respiratory distress Neck:  Negative scars, masses, torticollis, lymphadenopathy, JVD Lungs: Clear to auscultation bilaterally without wheezes or crackles Cardiovascular: Regular rate and rhythm without murmur gallop or rub normal S1 and S2 Abdomen: negative abdominal pain, nondistended, positive soft, bowel sounds, no rebound, no ascites, no appreciable mass Extremities: No significant cyanosis, clubbing, or edema bilateral lower extremities Skin: Negative rashes, lesions, ulcers Psychiatric:  Negative depression, negative anxiety, negative fatigue, negative mania  Central nervous system:  Cranial nerves II through XII intact, tongue/uvula midline, LEFT extremities muscle strength 5/5,RIGHT hemiparesis, sensation intact throughout,  positive dysarthria,  negative expressive aphasia, negative receptive aphasia.       Data Reviewed: Care during the described time interval was provided by me .  I have reviewed this patient's available data, including medical history, events of note, physical examination, and all test results as part of my evaluation.   CBC: Recent Labs  Lab 11/11/17 2159 11/12/17 0509 11/13/17 0408 11/14/17 0344 11/15/17 0441  WBC 14.2* 12.8* 11.6* 9.9 10.0  HGB 17.5* 15.7 14.3 15.1 15.6  HCT 49.3 45.4 42.1 45.1 46.3  MCV 99.2 100.2* 101.7* 102.0* 101.3*  PLT 230 219 256 284 308   Basic Metabolic Panel: Recent Labs  Lab 11/11/17 2100 11/12/17 0509 11/13/17 0408 11/14/17 0344 11/15/17 0441  NA 141 143 142 142 142  K 3.7 3.6 3.5 3.3* 3.8  CL 106 110 107 107 107  CO2 20* 21* 25 25 25   GLUCOSE 98 109* 121* 115* 110*  BUN 20 18 18 16 11   CREATININE 1.19 1.20 1.12 1.11 1.09  CALCIUM 9.1 8.9 8.7* 8.6* 8.6*  MG 2.0  --   --  2.0 1.9   GFR: Estimated Creatinine Clearance: 45.8 mL/min (by C-G formula based on SCr  of 1.09 mg/dL). Liver Function Tests: Recent Labs  Lab 11/12/17 0509  AST 40  ALT 41  ALKPHOS 135*  BILITOT 1.8*  PROT 6.3*  ALBUMIN 2.9*   No results for input(s): LIPASE, AMYLASE in the last 168 hours. No results for input(s): AMMONIA in the last 168 hours. Coagulation Profile: Recent Labs  Lab 11/12/17 0509  INR 1.30   Cardiac Enzymes: No results for input(s): CKTOTAL, CKMB, CKMBINDEX, TROPONINI in the last 168 hours. BNP (last 3 results) No results for input(s): PROBNP in the last 8760 hours. HbA1C: No results for input(s): HGBA1C in the last 72 hours. CBG: No results for input(s): GLUCAP in the last 168 hours. Lipid Profile: Recent Labs    11/14/17 0344  CHOL 123  HDL 33*  LDLCALC 74  TRIG 82  CHOLHDL 3.7   Thyroid Function Tests: No results for input(s): TSH, T4TOTAL, FREET4, T3FREE, THYROIDAB in the last 72 hours. Anemia Panel: No results for input(s): VITAMINB12,  FOLATE, FERRITIN, TIBC, IRON, RETICCTPCT in the last 72 hours. Urine analysis:    Component Value Date/Time   COLORURINE YELLOW 11/05/2017 0331   APPEARANCEUR CLEAR 11/05/2017 0331   LABSPEC 1.009 11/05/2017 0331   PHURINE 8.0 11/05/2017 0331   GLUCOSEU NEGATIVE 11/05/2017 0331   HGBUR SMALL (A) 11/05/2017 0331   BILIRUBINUR NEGATIVE 11/05/2017 0331   KETONESUR NEGATIVE 11/05/2017 0331   PROTEINUR 30 (A) 11/05/2017 0331   NITRITE NEGATIVE 11/05/2017 0331   LEUKOCYTESUR NEGATIVE 11/05/2017 0331   Sepsis Labs: @LABRCNTIP (procalcitonin:4,lacticidven:4)  )No results found for this or any previous visit (from the past 240 hour(s)).       Radiology Studies: No results found.      Scheduled Meds: . atorvastatin  60 mg Oral q1800  . cholecalciferol  1,000 Units Oral Daily  . diltiazem  90 mg Oral Q8H  . docusate sodium  100 mg Oral BID  . enoxaparin (LOVENOX) injection  40 mg Subcutaneous QHS  . feeding supplement (ENSURE ENLIVE)  237 mL Oral BID BM  . metoprolol tartrate  50 mg Oral BID  . multivitamin with minerals  1 tablet Oral Daily  . pantoprazole  40 mg Oral QHS  . QUEtiapine  12.5 mg Oral BID  . sodium chloride flush  3 mL Intravenous Q12H   Continuous Infusions: . dextrose 5 % and 0.9% NaCl 75 mL/hr at 11/15/17 0012     LOS: 4 days    Time spent: 40 minutes     Mariena Meares, Roselind Messier, MD [facility] Pager [phone]   If 7PM-7AM, please contact night-coverage [email or web address] Password [email or web address] 11/15/2017, 9:10 AM

## 2017-11-15 NOTE — Progress Notes (Addendum)
Nutrition Follow-up /Calorie Count  INTERVENTION:  Continue Ensure Enlive po BID, each supplement provides 350kcals and 20g of protein. Thickened to nectar consistency.  NUTRITION DIAGNOSIS:   Inadequate oral intake related to dysphagia as evidenced by other (comment)(meal completion 50-75%). Ongoing.  GOAL:   Patient will meet greater than or equal to 90% of their needs Progressing/meeting.  ASSESSMENT:   84yo male with PMH a-fib, HTN, HLD, CHF, recent Hemorrhagic CVA with resultant right sided hemiparesis and expressive dysarthria, requiring dysphagia I/nectar thick liquid liquid. Pt d/c to SNF for rehabilitation; s/p fall on day of admission.   3/19 48hr calorie count ordered; concern for PEG placement.  Limited documentation available for calorie count.  Information obtained from flowsheets and RN/ nurse tech. Nurse Tech does report pt does not like   Pt eating 50-75% of meals 3/20. Received 1x Ensure Enlive yesterday after intern visit; pt reports he likes Ensure. This meets 100% of pt's calorie needs and 74% of protein needs.  Pt did not eat pureed eggs, sausage, and nectar consistency milk this morning but did eat 100% of his grits, pureed fruit, and juice. Pt expressed dislike of pureed proteins, however he ate 75% of breakfast yesterday.   Given pt likes Ensure and is scheduled to receive BID today, feel like pt will meet his needs with nutrition supplements; Do not feel PEG is necessary at this time. Shared this information with Dr. Joseph ArtWoods.   Wt hx reviewed; wt appears to be fairly stable given potential fluctuations in fluid.  Diet Order:  DIET - DYS 1 Room service appropriate? Yes; Fluid consistency: Nectar Thick  Weight:   Wt Readings from Last 1 Encounters:  11/15/17 144 lb 2.9 oz (65.4 kg)  3/20  172lb 13.5oz 3/19  160lb 4.4oz 3/18  155lb 13.8oz  Wt Readings from Last 10 Encounters:  11/15/17 144 lb 2.9 oz (65.4 kg)  11/07/17 166 lb 7.2 oz (75.5 kg)   11/20/16 172 lb (78 kg)  11/01/15 167 lb 12.8 oz (76.1 kg)  09/08/14 178 lb (80.7 kg)  06/03/13 174 lb (78.9 kg)    Ideal Body Weight:  75.45 kg  BMI:  Body mass index is 20.69 kg/m.  Estimated Nutritional Needs:   Kcal:  1750-1950  Protein:  95-105g Fluid:  >1.7L   Azure Budnick, MS, Dietetic Intern Pager # [phone]

## 2017-11-15 NOTE — Progress Notes (Signed)
Physical Therapy Treatment Patient Details Name: Alexander PatchCarlton R Goodgame MRN: [id number] DOB: July 01, 1930 Today's Date: 11/15/2017    History of Present Illness 84 year old man with a hx of atrial fibrillation, HTN, and recent hemorrhagic CVA of the left pons (resultant right sided facial droop, expressive dysarthria, dysphagia requiring nectar thick liquids, and right sided hemiparesis) who was discharged to a SNF on 11/07/2017. He suffered a mechanical fall 11/11/17, and was brought to the ED for evaluation. Admitted with atrial fibrillation with RVR.    PT Comments    Pt is alert, more participative and subjectively easier to understand.  Pt still needing lots of cuing and significant assist.  Emphasis today on transition to EOB. Sitting balance, standing, transfer to [facility] (when needed toileting), longer standing for pericare and ultimately to recliner.    Follow Up Recommendations  SNF;Supervision/Assistance - 24 hour     Equipment Recommendations  None recommended by PT    Recommendations for Other Services       Precautions / Restrictions Precautions Precautions: Fall Precaution Comments: R sided weakness    Mobility  Bed Mobility Overal bed mobility: Needs Assistance Bed Mobility: Rolling;Sidelying to Sit Rolling: Mod assist Sidelying to sit: Max assist       General bed mobility comments: cues for technique with truncal assist and hand over hand placement of R hand and assist of L UE.  Transfers Overall transfer level: Needs assistance Equipment used: Rolling walker (2 wheeled);1 person hand held assist Transfers: Sit to/from [employer];Stand Pivot Transfers;Squat Pivot Transfers Sit to Stand: Max assist;+2 safety/equipment Stand pivot transfers: Max assist;+2 safety/equipment Squat pivot transfers: Max assist;+2 safety/equipment     General transfer comment: cues for hand placement, assist to help forward and boost.  pt unable to pivot feet, but able to maintain w/bearing  with some assist L LE.  Ambulation/Gait                 Stairs            Wheelchair Mobility    Modified Rankin (Stroke Patients Only) Modified Rankin (Stroke Patients Only) Modified Rankin: Severe disability     Balance Overall balance assessment: Needs assistance Sitting-balance support: Single extremity supported Sitting balance-Leahy Scale: Poor Sitting balance - Comments: mod to min guard for short periods, but will eventually list off right and posteriorly   Standing balance support: Bilateral upper extremity supported Standing balance-Leahy Scale: Poor Standing balance comment: Max assist to sustain standing.                             Cognition Arousal/Alertness: Awake/alert Behavior During Therapy: WFL for tasks assessed/performed Overall Cognitive Status: (not tested formally, but pt can follow commands given time)                                        Exercises Other Exercises Other Exercises: bil hip/knee ROM with assist as needed and graded resistance gross extention    General Comments General comments (skin integrity, edema, etc.): son and wife present      Pertinent Vitals/Pain Faces Pain Scale: No hurt    Home Living                      Prior Function            PT Goals (current goals can now  be found in the care plan section) Acute Rehab PT Goals Patient Stated Goal: walking PT Goal Formulation: With patient/family Time For Goal Achievement: 11/25/17 Potential to Achieve Goals: Fair Progress towards PT goals: Progressing toward goals    Frequency    Min 3X/week      PT Plan Current plan remains appropriate    Co-evaluation              AM-PAC PT "6 Clicks" Daily Activity  Outcome Measure  Difficulty turning over in bed (including adjusting bedclothes, sheets and blankets)?: Unable Difficulty moving from lying on back to sitting on the side of the bed? :  Unable Difficulty sitting down on and standing up from a chair with arms (e.g., wheelchair, bedside commode, etc,.)?: Unable Help needed moving to and from a bed to chair (including a wheelchair)?: A Lot Help needed walking in hospital room?: Total Help needed climbing 3-5 steps with a railing? : Total 6 Click Score: 7    End of Session   Activity Tolerance: Patient limited by fatigue Patient left: in chair;with call bell/phone within reach;with chair alarm set;with family/visitor present Nurse Communication: Mobility status;Need for lift equipment PT Visit Diagnosis: Hemiplegia and hemiparesis;Muscle weakness (generalized) (M62.81);Other abnormalities of gait and mobility (R26.89) Hemiplegia - Right/Left: Right Hemiplegia - dominant/non-dominant: Dominant Hemiplegia - caused by: Nontraumatic intracerebral hemorrhage     Time: 1518-1600 PT Time Calculation (min) (ACUTE ONLY): 42 min  Charges:  $Therapeutic Exercise: 8-22 mins $Therapeutic Activity: 23-37 mins                    G Codes:       11-16-17  [place] Bing, PT [phone]  (pager)   Eliseo Gum Shomari Matusik Nov 16, 2017, 4:39 PM

## 2017-11-16 DIAGNOSIS — R2689 Other abnormalities of gait and mobility: Secondary | ICD-10-CM | POA: Diagnosis not present

## 2017-11-16 DIAGNOSIS — W19XXXA Unspecified fall, initial encounter: Secondary | ICD-10-CM | POA: Diagnosis not present

## 2017-11-16 DIAGNOSIS — E7849 Other hyperlipidemia: Secondary | ICD-10-CM | POA: Diagnosis not present

## 2017-11-16 DIAGNOSIS — R488 Other symbolic dysfunctions: Secondary | ICD-10-CM | POA: Diagnosis not present

## 2017-11-16 DIAGNOSIS — H538 Other visual disturbances: Secondary | ICD-10-CM | POA: Diagnosis not present

## 2017-11-16 DIAGNOSIS — I340 Nonrheumatic mitral (valve) insufficiency: Secondary | ICD-10-CM | POA: Diagnosis not present

## 2017-11-16 DIAGNOSIS — I639 Cerebral infarction, unspecified: Secondary | ICD-10-CM

## 2017-11-16 DIAGNOSIS — R063 Periodic breathing: Secondary | ICD-10-CM | POA: Diagnosis not present

## 2017-11-16 DIAGNOSIS — R1310 Dysphagia, unspecified: Secondary | ICD-10-CM | POA: Diagnosis not present

## 2017-11-16 DIAGNOSIS — I619 Nontraumatic intracerebral hemorrhage, unspecified: Secondary | ICD-10-CM | POA: Diagnosis not present

## 2017-11-16 DIAGNOSIS — I69251 Hemiplegia and hemiparesis following other nontraumatic intracranial hemorrhage affecting right dominant side: Secondary | ICD-10-CM | POA: Diagnosis not present

## 2017-11-16 DIAGNOSIS — Z66 Do not resuscitate: Secondary | ICD-10-CM | POA: Diagnosis not present

## 2017-11-16 DIAGNOSIS — I629 Nontraumatic intracranial hemorrhage, unspecified: Secondary | ICD-10-CM | POA: Diagnosis not present

## 2017-11-16 DIAGNOSIS — R4702 Dysphasia: Secondary | ICD-10-CM

## 2017-11-16 DIAGNOSIS — K219 Gastro-esophageal reflux disease without esophagitis: Secondary | ICD-10-CM | POA: Diagnosis present

## 2017-11-16 DIAGNOSIS — Z79899 Other long term (current) drug therapy: Secondary | ICD-10-CM | POA: Diagnosis not present

## 2017-11-16 DIAGNOSIS — I6300 Cerebral infarction due to thrombosis of unspecified precerebral artery: Secondary | ICD-10-CM | POA: Diagnosis not present

## 2017-11-16 DIAGNOSIS — I700 Atherosclerosis of aorta: Secondary | ICD-10-CM | POA: Diagnosis present

## 2017-11-16 DIAGNOSIS — I69351 Hemiplegia and hemiparesis following cerebral infarction affecting right dominant side: Secondary | ICD-10-CM | POA: Diagnosis not present

## 2017-11-16 DIAGNOSIS — I10 Essential (primary) hypertension: Secondary | ICD-10-CM | POA: Diagnosis not present

## 2017-11-16 DIAGNOSIS — I69322 Dysarthria following cerebral infarction: Secondary | ICD-10-CM | POA: Diagnosis not present

## 2017-11-16 DIAGNOSIS — Z87820 Personal history of traumatic brain injury: Secondary | ICD-10-CM | POA: Diagnosis not present

## 2017-11-16 DIAGNOSIS — I480 Paroxysmal atrial fibrillation: Secondary | ICD-10-CM | POA: Diagnosis not present

## 2017-11-16 DIAGNOSIS — R0902 Hypoxemia: Secondary | ICD-10-CM | POA: Diagnosis not present

## 2017-11-16 DIAGNOSIS — R52 Pain, unspecified: Secondary | ICD-10-CM | POA: Diagnosis not present

## 2017-11-16 DIAGNOSIS — R1312 Dysphagia, oropharyngeal phase: Secondary | ICD-10-CM | POA: Diagnosis not present

## 2017-11-16 DIAGNOSIS — R402142 Coma scale, eyes open, spontaneous, at arrival to emergency department: Secondary | ICD-10-CM | POA: Diagnosis present

## 2017-11-16 DIAGNOSIS — I69391 Dysphagia following cerebral infarction: Secondary | ICD-10-CM | POA: Diagnosis not present

## 2017-11-16 DIAGNOSIS — I739 Peripheral vascular disease, unspecified: Secondary | ICD-10-CM | POA: Diagnosis present

## 2017-11-16 DIAGNOSIS — I63412 Cerebral infarction due to embolism of left middle cerebral artery: Secondary | ICD-10-CM | POA: Diagnosis not present

## 2017-11-16 DIAGNOSIS — R29810 Facial weakness: Secondary | ICD-10-CM | POA: Diagnosis not present

## 2017-11-16 DIAGNOSIS — E7800 Pure hypercholesterolemia, unspecified: Secondary | ICD-10-CM | POA: Diagnosis present

## 2017-11-16 DIAGNOSIS — G8191 Hemiplegia, unspecified affecting right dominant side: Secondary | ICD-10-CM | POA: Diagnosis not present

## 2017-11-16 DIAGNOSIS — R402222 Coma scale, best verbal response, incomprehensible words, at arrival to emergency department: Secondary | ICD-10-CM | POA: Diagnosis not present

## 2017-11-16 DIAGNOSIS — G460 Middle cerebral artery syndrome: Secondary | ICD-10-CM | POA: Diagnosis not present

## 2017-11-16 DIAGNOSIS — I6523 Occlusion and stenosis of bilateral carotid arteries: Secondary | ICD-10-CM | POA: Diagnosis present

## 2017-11-16 DIAGNOSIS — I679 Cerebrovascular disease, unspecified: Secondary | ICD-10-CM | POA: Diagnosis not present

## 2017-11-16 DIAGNOSIS — G8101 Flaccid hemiplegia affecting right dominant side: Secondary | ICD-10-CM | POA: Diagnosis not present

## 2017-11-16 DIAGNOSIS — M6281 Muscle weakness (generalized): Secondary | ICD-10-CM | POA: Diagnosis not present

## 2017-11-16 DIAGNOSIS — I4892 Unspecified atrial flutter: Secondary | ICD-10-CM | POA: Diagnosis not present

## 2017-11-16 DIAGNOSIS — I4891 Unspecified atrial fibrillation: Secondary | ICD-10-CM | POA: Diagnosis not present

## 2017-11-16 DIAGNOSIS — I63233 Cerebral infarction due to unspecified occlusion or stenosis of bilateral carotid arteries: Secondary | ICD-10-CM | POA: Diagnosis not present

## 2017-11-16 DIAGNOSIS — R0989 Other specified symptoms and signs involving the circulatory and respiratory systems: Secondary | ICD-10-CM | POA: Diagnosis not present

## 2017-11-16 DIAGNOSIS — R4701 Aphasia: Secondary | ICD-10-CM | POA: Diagnosis not present

## 2017-11-16 DIAGNOSIS — R471 Dysarthria and anarthria: Secondary | ICD-10-CM | POA: Diagnosis not present

## 2017-11-16 DIAGNOSIS — R600 Localized edema: Secondary | ICD-10-CM | POA: Diagnosis not present

## 2017-11-16 DIAGNOSIS — Z515 Encounter for palliative care: Secondary | ICD-10-CM | POA: Diagnosis not present

## 2017-11-16 DIAGNOSIS — I613 Nontraumatic intracerebral hemorrhage in brain stem: Secondary | ICD-10-CM | POA: Diagnosis not present

## 2017-11-16 DIAGNOSIS — I5032 Chronic diastolic (congestive) heart failure: Secondary | ICD-10-CM | POA: Diagnosis not present

## 2017-11-16 DIAGNOSIS — Z8673 Personal history of transient ischemic attack (TIA), and cerebral infarction without residual deficits: Secondary | ICD-10-CM | POA: Diagnosis not present

## 2017-11-16 DIAGNOSIS — E785 Hyperlipidemia, unspecified: Secondary | ICD-10-CM | POA: Diagnosis not present

## 2017-11-16 DIAGNOSIS — H53461 Homonymous bilateral field defects, right side: Secondary | ICD-10-CM | POA: Diagnosis present

## 2017-11-16 DIAGNOSIS — I499 Cardiac arrhythmia, unspecified: Secondary | ICD-10-CM | POA: Diagnosis not present

## 2017-11-16 DIAGNOSIS — I63512 Cerebral infarction due to unspecified occlusion or stenosis of left middle cerebral artery: Secondary | ICD-10-CM | POA: Diagnosis not present

## 2017-11-16 DIAGNOSIS — I6789 Other cerebrovascular disease: Secondary | ICD-10-CM | POA: Diagnosis not present

## 2017-11-16 DIAGNOSIS — R451 Restlessness and agitation: Secondary | ICD-10-CM | POA: Diagnosis present

## 2017-11-16 DIAGNOSIS — R278 Other lack of coordination: Secondary | ICD-10-CM | POA: Diagnosis not present

## 2017-11-16 DIAGNOSIS — R402352 Coma scale, best motor response, localizes pain, at arrival to emergency department: Secondary | ICD-10-CM | POA: Diagnosis present

## 2017-11-16 DIAGNOSIS — R0789 Other chest pain: Secondary | ICD-10-CM | POA: Diagnosis not present

## 2017-11-16 LAB — BASIC METABOLIC PANEL
Anion gap: 11 (ref 5–15)
BUN: 10 mg/dL (ref 6–20)
CO2: 23 mmol/L (ref 22–32)
CREATININE: 1.1 mg/dL (ref 0.61–1.24)
Calcium: 8.7 mg/dL — ABNORMAL LOW (ref 8.9–10.3)
Chloride: 106 mmol/L (ref 101–111)
GFR, EST NON AFRICAN AMERICAN: 59 mL/min — AB (ref >60)
Glucose, Bld: 113 mg/dL — ABNORMAL HIGH (ref 65–99)
Potassium: 3.7 mmol/L (ref 3.5–5.1)
Sodium: 140 mmol/L (ref 135–145)

## 2017-11-16 LAB — CBC
HCT: 42.8 % (ref 39.0–52.0)
Hemoglobin: 14.8 g/dL (ref 13.0–17.0)
MCH: 34.7 pg — AB (ref 26.0–34.0)
MCHC: 34.6 g/dL (ref 30.0–36.0)
MCV: 100.2 fL — AB (ref 78.0–100.0)
PLATELETS: 318 10*3/uL (ref 150–400)
RBC: 4.27 MIL/uL (ref 4.22–5.81)
RDW: 13.3 % (ref 11.5–15.5)
WBC: 12.6 10*3/uL — AB (ref 4.0–10.5)

## 2017-11-16 LAB — MAGNESIUM: Magnesium: 1.8 mg/dL (ref 1.7–2.4)

## 2017-11-16 MED ORDER — METOPROLOL TARTRATE 50 MG PO TABS: 50.0000 mg | ORAL_TABLET | Freq: Two times a day (BID) | ORAL | 0 refills | Status: DC

## 2017-11-16 MED ORDER — ENSURE ENLIVE PO LIQD: 237.0000 mL | Freq: Two times a day (BID) | ORAL | 0 refills | Status: DC

## 2017-11-16 MED ORDER — RESOURCE THICKENUP CLEAR PO POWD: ORAL | 0 refills | Status: DC

## 2017-11-16 MED ORDER — ATORVASTATIN CALCIUM 20 MG PO TABS: 60.0000 mg | ORAL_TABLET | Freq: Every day | ORAL | 0 refills | Status: DC

## 2017-11-16 MED ORDER — DILTIAZEM HCL 90 MG PO TABS: 90.0000 mg | ORAL_TABLET | Freq: Three times a day (TID) | ORAL | 0 refills | Status: DC

## 2017-11-16 NOTE — Care Management Important Message (Signed)
Important Message  Patient Details  Name: Alexander PatchCarlton R Vellucci MRN: [id number] Date of Birth: Oct 24, 1929   Medicare Important Message Given:  Yes    Tamalyn Wadsworth P Shereen Marton 11/16/2017, 2:46 PM

## 2017-11-16 NOTE — Progress Notes (Signed)
Occupational Therapy Treatment Patient Details Name: [name] MRN: [id number] DOB: 07/18/1930 Today's Date: 11/16/2017    History of present illness 84 year old man with a hx of atrial fibrillation, HTN, and recent hemorrhagic CVA of the left pons (resultant right sided facial droop, expressive dysarthria, dysphagia requiring nectar thick liquids, and right sided hemiparesis) who was discharged to a SNF on 11/07/2017. He suffered a mechanical fall 11/11/17, and was brought to the ED for evaluation. Admitted with atrial fibrillation with RVR.   OT comments  Pt tolerated sitting EOB x10 minutes with min guard to max assist working on trunk control and sitting balance tasks; pt completing grooming tasks x2 and reaching outside base of support. HR up to 210 max; sustained in 130s. D/c plan remains appropriate. Will continue to follow acutely.   Follow Up Recommendations  SNF;Supervision/Assistance - 24 hour    Equipment Recommendations  Other (comment)(TBD at next venue)    Recommendations for Other Services      Precautions / Restrictions Precautions Precautions: Fall Precaution Comments: R sided weakness Restrictions Weight Bearing Restrictions: No       Mobility Bed Mobility Overal bed mobility: Needs Assistance Bed Mobility: Rolling;Sidelying to Sit Rolling: Mod assist Sidelying to sit: Max assist   Sit to supine: Max assist;+2 for physical assistance   General bed mobility comments: cues for technique with truncal assist and hand over hand placement of R hand and assist of L UE.  Transfers Overall transfer level: Needs assistance Equipment used: Ambulation equipment used Transfers: Sit to/from Stand Sit to Stand: Max assist;+2 safety/equipment         General transfer comment: cues for hand placement, assist to help forward and boost. pt able to maintain w/bearing with some assist L LE, but unable to attain the upright stance attained last treatment session    Balance Overall balance assessment: Needs assistance Sitting-balance support: Single extremity supported Sitting balance-Leahy Scale: Poor(but moving toward fair) Sitting balance - Comments: able to sit up with L UE assist, able to reach forward with L UE and give high 5's with OT.  pt needed some stability assist to attain upright midline posture.  With loss of focus, pt consistently lists posteriorly     Standing balance-Leahy Scale: Poor Standing balance comment: Max of two to maintain standing EOB                           ADL either performed or assessed with clinical judgement   ADL Overall ADL's : Needs assistance/impaired     Grooming: Moderate assistance;Sitting;Wash/dry face;Brushing hair Grooming Details (indicate cue type and reason): Mod assist for sitting balance; cues for initiation of tasks                                     Vision       Perception     Praxis      Cognition Arousal/Alertness: Awake/alert Behavior During Therapy: WFL for tasks assessed/performed Overall Cognitive Status: Difficult to assess(not tested formally)                                          Exercises Exercises: Other exercises Other Exercises Other Exercises: Worked on trunk control and sitting balance at EOB x10 minutes. Max cues for posture  and correcting to upright sitting Other Exercises: Reaching activities outside of base of support    Shoulder Instructions       General Comments HR up to 210 max; sustained in 130s    Pertinent Vitals/ Pain       Pain Assessment: Faces Faces Pain Scale: Hurts little more Pain Location: multiple places, back Pain Descriptors / Indicators: Guarding;Aching Pain Intervention(s): Monitored during session  Home Living                                          Prior Functioning/Environment              Frequency  Min 2X/week        Progress Toward Goals  OT  Goals(current goals can now be found in the care plan section)  Progress towards OT goals: Progressing toward goals  Acute Rehab OT Goals Patient Stated Goal: none stated OT Goal Formulation: With patient  Plan Discharge plan remains appropriate    Co-evaluation    PT/OT/SLP Co-Evaluation/Treatment: Yes Reason for Co-Treatment: Complexity of the patient's impairments (multi-system involvement);For patient/therapist safety   OT goals addressed during session: ADL's and self-care      AM-PAC PT "6 Clicks" Daily Activity     Outcome Measure   Help from another person eating meals?: A Lot Help from another person taking care of personal grooming?: A Lot Help from another person toileting, which includes using toliet, bedpan, or urinal?: A Lot Help from another person bathing (including washing, rinsing, drying)?: A Lot Help from another person to put on and taking off regular upper body clothing?: A Lot Help from another person to put on and taking off regular lower body clothing?: A Lot 6 Click Score: 12    End of Session    OT Visit Diagnosis: Unsteadiness on feet (R26.81);Other abnormalities of gait and mobility (R26.89);Muscle weakness (generalized) (M62.81);Hemiplegia and hemiparesis Hemiplegia - Right/Left: Right Hemiplegia - dominant/non-dominant: Dominant Hemiplegia - caused by: Cerebral infarction   Activity Tolerance Patient tolerated treatment well   Patient Left in bed;with call bell/phone within reach;with bed alarm set;with family/visitor present   Nurse Communication          Time: 1610-9604 OT Time Calculation (min): 29 min  Charges: OT General Charges $OT Visit: 1 Visit OT Treatments $Self Care/Home Management : 8-22 mins  [name] A. Brett Albino, M.S., OTR/L Pager: [phone]  Gaye Alken 11/16/2017, 4:23 PM

## 2017-11-16 NOTE — Progress Notes (Signed)
Patient's facility has [employer] authorization for patient to readmit. CSW to support with discharge.  Abigail ButtsSusan Jaliza Seifried, LCSWA [phone]

## 2017-11-16 NOTE — Progress Notes (Signed)
Attempted report to [facility]. IV d/c. Wife at bedside.

## 2017-11-16 NOTE — Progress Notes (Signed)
Physical Therapy Treatment Patient Details Name: [name] MRN: [id number] DOB: 1930/06/18 Today's Date: 11/16/2017    History of Present Illness 84 year old man with a hx of atrial fibrillation, HTN, and recent hemorrhagic CVA of the left pons (resultant right sided facial droop, expressive dysarthria, dysphagia requiring nectar thick liquids, and right sided hemiparesis) who was discharged to a SNF on 11/07/2017. He suffered a mechanical fall 11/11/17, and was brought to the ED for evaluation. Admitted with atrial fibrillation with RVR.    PT Comments    Pt a little more fatigued than last treatment.  Emphasis still on transition to sit, sitting EOB concentrating on moving out of BOS and back to midline and standing trials/stability.    Follow Up Recommendations  SNF;Supervision/Assistance - 24 hour     Equipment Recommendations  None recommended by PT    Recommendations for Other Services       Precautions / Restrictions Precautions Precautions: Fall Precaution Comments: R sided weakness    Mobility  Bed Mobility Overal bed mobility: Needs Assistance Bed Mobility: Rolling;Sidelying to Sit Rolling: Mod assist Sidelying to sit: Max assist   Sit to supine: Max assist;+2 for physical assistance   General bed mobility comments: cues for technique with truncal assist and hand over hand placement of R hand and assist of L UE.  Transfers Overall transfer level: Needs assistance Equipment used: Ambulation equipment used Transfers: Sit to/from Stand Sit to Stand: Max assist;+2 safety/equipment         General transfer comment: cues for hand placement, assist to help forward and boost. pt able to maintain w/bearing with some assist L LE, but unable to attain the upright stance attained last treatment session  Ambulation/Gait             General Gait Details: Not tested   Stairs            Wheelchair Mobility    Modified Rankin (Stroke Patients  Only) Modified Rankin (Stroke Patients Only) Pre-Morbid Rankin Score: No symptoms Modified Rankin: Severe disability     Balance Overall balance assessment: Needs assistance Sitting-balance support: Single extremity supported Sitting balance-Leahy Scale: Poor(but moving toward fair) Sitting balance - Comments: able to sit up with L UE assist, able to reach forward with L UE and give high 5's with OT.  pt needed some stability assist to attain upright midline posture.  With loss of focus, pt consistently lists posteriorly     Standing balance-Leahy Scale: Poor Standing balance comment: Max of two to maintain standing EOB                            Cognition Arousal/Alertness: Awake/alert Behavior During Therapy: WFL for tasks assessed/performed Overall Cognitive Status: Difficult to assess(not tested formally)                                        Exercises      General Comments        Pertinent Vitals/Pain Pain Assessment: Faces Faces Pain Scale: Hurts little more Pain Location: multiple places, back Pain Descriptors / Indicators: Guarding;Aching Pain Intervention(s): Monitored during session    Home Living                      Prior Function            PT  Goals (current goals can now be found in the care plan section) Acute Rehab PT Goals PT Goal Formulation: With patient/family Time For Goal Achievement: 11/25/17 Potential to Achieve Goals: Fair Progress towards PT goals: Progressing toward goals    Frequency    Min 3X/week      PT Plan Current plan remains appropriate    Co-evaluation PT/OT/SLP Co-Evaluation/Treatment: Yes Reason for Co-Treatment: Complexity of the patient's impairments (multi-system involvement);For patient/therapist safety          AM-PAC PT "6 Clicks" Daily Activity  Outcome Measure  Difficulty turning over in bed (including adjusting bedclothes, sheets and blankets)?:  Unable Difficulty moving from lying on back to sitting on the side of the bed? : Unable Difficulty sitting down on and standing up from a chair with arms (e.g., wheelchair, bedside commode, etc,.)?: Unable Help needed moving to and from a bed to chair (including a wheelchair)?: A Lot Help needed walking in hospital room?: None Help needed climbing 3-5 steps with a railing? : Total 6 Click Score: 10    End of Session   Activity Tolerance: Patient limited by fatigue Patient left: in bed;with call bell/phone within reach;with family/visitor present Nurse Communication: Mobility status Hemiplegia - Right/Left: Right Hemiplegia - dominant/non-dominant: Dominant Hemiplegia - caused by: Nontraumatic intracerebral hemorrhage     Time: 1359-1428 PT Time Calculation (min) (ACUTE ONLY): 29 min  Charges:  $Therapeutic Activity: 8-22 mins                    G Codes:       11/16/2017  [name], PT [phone]  (pager)   [name] 11/16/2017, 3:02 PM

## 2017-11-16 NOTE — Progress Notes (Signed)
Patient will discharge back to [facility]. Anticipated discharge date: 11/16/17 Family notified: Lincoln MaxinRuss Domingo, son Transportation by: [facility]  Nurse to call report to [phone]. Patient will go to room 1107 at the facility.   CSW signing off.  Abigail ButtsSusan Kaslyn [place], LCSWA  Clinical Social Worker
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
## 2017-11-16 NOTE — Clinical Social Work Placement (Signed)
   CLINICAL SOCIAL WORK PLACEMENT  NOTE  Date:  11/16/2017  Patient Details  Name: [name] MRN: [id number] Date of Birth: 12/24/1929  Clinical Social Work is seeking post-discharge placement for this patient at the Skilled  Nursing Facility level of care (*CSW will initial, date and re-position this form in  chart as items are completed):  Yes   Patient/family provided with [place] Clinical Social Work Department's list of facilities offering this level of care within the geographic area requested by the patient (or if unable, by the patient's family).  Yes   Patient/family informed of their freedom to choose among providers that offer the needed level of care, that participate in Medicare, Medicaid or managed care program needed by the patient, have an available bed and are willing to accept the patient.  Yes   Patient/family informed of [place]'s ownership interest [facility] and [facility], as well as of the fact that they are under no obligation to receive care at these facilities.  PASRR submitted to EDS on       PASRR number received on       Existing PASRR number confirmed on 11/13/17     FL2 transmitted to all facilities in geographic area requested by pt/family on 11/13/17     FL2 transmitted to all facilities within larger geographic area on       Patient informed that his/her managed care company has contracts with or will negotiate with certain facilities, including the following:  [name]     Yes   Patient/family informed of bed offers received.  [facility]     Physician recommends and patient chooses bed at      [facility].  Patient to be transferred to facility by [facility]     Patient family notified on 11/16/17 of transfer.  Name of family member notified:  [name], spouse     PHYSICIAN Please prepare priority discharge summary, including  medications, Please prepare prescriptions, Please sign FL2     Additional Comment:    _______________________________________________ [name], [name] 11/16/2017, 9:53 AM

## 2017-11-19 DIAGNOSIS — G8191 Hemiplegia, unspecified affecting right dominant side: Secondary | ICD-10-CM | POA: Diagnosis not present

## 2017-11-19 DIAGNOSIS — I619 Nontraumatic intracerebral hemorrhage, unspecified: Secondary | ICD-10-CM | POA: Diagnosis not present

## 2017-11-19 DIAGNOSIS — I679 Cerebrovascular disease, unspecified: Secondary | ICD-10-CM | POA: Diagnosis not present

## 2017-11-19 DIAGNOSIS — I69391 Dysphagia following cerebral infarction: Secondary | ICD-10-CM | POA: Diagnosis not present

## 2017-11-22 DIAGNOSIS — R1310 Dysphagia, unspecified: Secondary | ICD-10-CM | POA: Diagnosis not present

## 2017-11-22 DIAGNOSIS — R2689 Other abnormalities of gait and mobility: Secondary | ICD-10-CM | POA: Diagnosis not present

## 2017-11-22 DIAGNOSIS — I69322 Dysarthria following cerebral infarction: Secondary | ICD-10-CM | POA: Diagnosis not present

## 2017-11-22 DIAGNOSIS — I69251 Hemiplegia and hemiparesis following other nontraumatic intracranial hemorrhage affecting right dominant side: Secondary | ICD-10-CM | POA: Diagnosis not present

## 2017-11-27 DIAGNOSIS — R1310 Dysphagia, unspecified: Secondary | ICD-10-CM | POA: Diagnosis not present

## 2017-11-27 DIAGNOSIS — I69251 Hemiplegia and hemiparesis following other nontraumatic intracranial hemorrhage affecting right dominant side: Secondary | ICD-10-CM | POA: Diagnosis not present

## 2017-11-27 DIAGNOSIS — I69322 Dysarthria following cerebral infarction: Secondary | ICD-10-CM | POA: Diagnosis not present

## 2017-11-27 DIAGNOSIS — R2689 Other abnormalities of gait and mobility: Secondary | ICD-10-CM | POA: Diagnosis not present

## 2017-12-05 DIAGNOSIS — R2689 Other abnormalities of gait and mobility: Secondary | ICD-10-CM | POA: Diagnosis not present

## 2017-12-05 DIAGNOSIS — I69251 Hemiplegia and hemiparesis following other nontraumatic intracranial hemorrhage affecting right dominant side: Secondary | ICD-10-CM | POA: Diagnosis not present

## 2017-12-05 DIAGNOSIS — I69322 Dysarthria following cerebral infarction: Secondary | ICD-10-CM | POA: Diagnosis not present

## 2017-12-05 DIAGNOSIS — R1310 Dysphagia, unspecified: Secondary | ICD-10-CM | POA: Diagnosis not present

## 2017-12-06 ENCOUNTER — Ambulatory Visit: Payer: Self-pay | Admitting: Adult Health

## 2017-12-12 DIAGNOSIS — I69322 Dysarthria following cerebral infarction: Secondary | ICD-10-CM | POA: Diagnosis not present

## 2017-12-12 DIAGNOSIS — I69251 Hemiplegia and hemiparesis following other nontraumatic intracranial hemorrhage affecting right dominant side: Secondary | ICD-10-CM | POA: Diagnosis not present

## 2017-12-12 DIAGNOSIS — R1310 Dysphagia, unspecified: Secondary | ICD-10-CM | POA: Diagnosis not present

## 2017-12-12 DIAGNOSIS — R2689 Other abnormalities of gait and mobility: Secondary | ICD-10-CM | POA: Diagnosis not present

## 2017-12-13 ENCOUNTER — Ambulatory Visit: Payer: Medicare HMO | Admitting: Adult Health

## 2017-12-13 ENCOUNTER — Encounter: Payer: Self-pay | Admitting: Adult Health

## 2017-12-13 VITALS — BP 120/76 | HR 70 | Ht 70.0 in

## 2017-12-13 DIAGNOSIS — I10 Essential (primary) hypertension: Secondary | ICD-10-CM | POA: Diagnosis not present

## 2017-12-13 DIAGNOSIS — I613 Nontraumatic intracerebral hemorrhage in brain stem: Secondary | ICD-10-CM | POA: Diagnosis not present

## 2017-12-13 DIAGNOSIS — E785 Hyperlipidemia, unspecified: Secondary | ICD-10-CM

## 2017-12-13 DIAGNOSIS — I480 Paroxysmal atrial fibrillation: Secondary | ICD-10-CM

## 2017-12-13 NOTE — Progress Notes (Signed)
I reviewed above note and agree with the assessment and plan.   Marvel PlanJindong Jesusita Jocelyn, MD PhD Stroke Neurology 12/13/2017 3:25 PM

## 2017-12-13 NOTE — Patient Instructions (Signed)
Continue lipitor  for secondary stroke prevention  Continue PT/OT/ST to continue to improve  Follow up with PCP regarding cholesterol and hypertension  Schedule follow up appointment with Dr. Katrinka BlazingSmith to manage atrial fibrillation  Maintain strict control of hypertension with blood pressure goal below 130/90, diabetes with hemoglobin A1c goal below 6.5% and cholesterol with LDL cholesterol (bad cholesterol) goal below 70 mg/dL. I also advised the patient to eat a healthy diet with plenty of whole grains, cereals, fruits and vegetables, exercise regularly and maintain ideal body weight.  Followup in the future with me in 5 months or call earlier if needed

## 2017-12-13 NOTE — Progress Notes (Signed)
[facility] [address] [place]. [place] [address] [phone]       OFFICE FOLLOW UP NOTE  [name] Date of Birth:  04-03-1930 Medical Record Number:  [id number]   Reason for Referral:  hospital ICH follow up  CHIEF COMPLAINT:  Chief Complaint  Patient presents with  . Follow-up      L [place] ICH (intracerebral hemorrhage)follow up, Pt is at [facility] pt in wheelchair pt cannot stand or walk,Pts daughter in room    HPI: [name] is being seen today for initial visit in the office for left pons ICH on 10/27/2017. History obtained from patient and chart review. Reviewed all radiology images and labs personally.   [name] an 84 y.o.malewith a PMH of HTN, HLD and AFIB on Coumadin brought into the ED by EMS with reports of acute onset Right sided weakness,facial droop, dysarthria and left gaze preference. Patient was found down outside near his barn. Per reports her was just about to climb a ladder when he fell to the ground. B/P in the field was 168/82 and pulse of 118. Patient is able to tell us that he is compliant with his Coumadin. On admission, INR 2.33. Family denies any recent illness, medication changes or hospitalizations.  Initial head CT shows large ICH in the left pons.  IV K Centra and vitamin K were administered for reversal which lowered INR to 1.26.  Repeat CT scan stable.  2D echo EF 55-60%.  LDL 91 A1c 5.8.  Patient was on Lipitor 20 mg PT and recommended to increase this to Lipitor 40 mg at discharge.  Hold antithrombotic or anticoagulation at this time due to ICH with underlying cavernoma versus hypertensive plus warfare and related coagulopathy.  Patient was discharged to SNF for PT/OT/SLP.  Patient return to ED on 11/11/2017 after a fall. Patient denied any pain, chest pain or shortness of breath.  CT imaging of the cervical spine and head reveal near complete resolution of prior left pontine hemorrhage.  No acute  cervical spine fracture.  Chest x-ray without acute findings.  Cardiology to see patient during admission for treatment of a flutter/A. fib with RVR and a follow-up appointment was arranged with Dr. Katrinka Blazing.  Patient continues to have right hemiparesis and moderate to severe dysarthria secondary to recent stroke.  Patient discharged back to [facility] for PT/OT/SLP.  Since discharge, patient has been doing well and is accompanied by his daughter.  He is currently living at [facility] where he receives PT/OT/SLP.  He continues to have mild dysarthria but no aphasia present.  Continues to have right hemiparesis but overall per patient this has been improving.  He continues to take Lipitor without side effects of myalgias.  This was recently increased by different provider to 60 mg.  Blood pressure today satisfactory 120/76.  Overall, patient seems to be in good spirits and is eager to continue therapies and continue to improve.  His end goal is to be able to go back home.  Denies new or worsening stroke/TIA symptoms.  ROS:   14 system review of systems performed and negative with exception of incontinence, constipation, weakness, and slurred speech  PMH:  Past Medical History:  Diagnosis Date  . Atrial fibrillation (HCC)   . Atrial flutter (HCC)   . Atrial flutter (HCC)   . Edema 03/17/2009   Qualifier: Diagnosis of  By: Flonnie Overman    . GERD (gastroesophageal reflux disease)   .  HTN (hypertension)   . Hypercholesteremia   . Hyperlipidemia   . Stroke [facility])     PSH: History reviewed. No pertinent surgical history.  Social History:  Social History   Socioeconomic History  . Marital status: Married    Spouse name: Not on file  . Number of children: Not on file  . Years of education: Not on file  . Highest education level: Not on file  Occupational History  . Not on file  Social Needs  . Financial resource strain: Not on file  . Food insecurity:    Worry: Not on file     Inability: Not on file  . Transportation needs:    Medical: Not on file    Non-medical: Not on file  Tobacco Use  . Smoking status: Never Smoker  . Smokeless tobacco: Never Used  Substance and Sexual Activity  . Alcohol use: No    Alcohol/week: 0.0 oz  . Drug use: No  . Sexual activity: Yes  Lifestyle  . Physical activity:    Days per week: Not on file    Minutes per session: Not on file  . Stress: Not on file  Relationships  . Social connections:    Talks on phone: Not on file    Gets together: Not on file    Attends religious service: Not on file    Active member of club or organization: Not on file    Attends meetings of clubs or organizations: Not on file    Relationship status: Not on file  . Intimate partner violence:    Fear of current or ex partner: Not on file    Emotionally abused: Not on file    Physically abused: Not on file    Forced sexual activity: Not on file  Other Topics Concern  . Not on file  Social History Narrative  . Not on file    Family History:  Family History  Problem Relation Age of Onset  . Healthy Mother   . Other Father        "too much iron in blood"  . Healthy Sister     Medications:   Current Outpatient Medications on File Prior to Visit  Medication Sig Dispense Refill  . atorvastatin (LIPITOR) 20 MG tablet Take 3 tablets (60 mg total) by mouth daily at 6 PM. 90 tablet 0  . Calcium Carbonate-Vitamin D (CALCIUM 600+D3 PO) Take 1 tablet by mouth daily.     . Cholecalciferol (VITAMIN D-3) 1000 units CAPS Take 1,000 Units by mouth daily.    Marland Kitchen diltiazem (CARDIZEM) 90 MG tablet Take 1 tablet (90 mg total) by mouth every 8 (eight) hours. 30 tablet 0  . docusate sodium (COLACE) 100 MG capsule Take 1 capsule (100 mg total) by mouth 2 (two) times daily. 60 capsule 1  . metoprolol tartrate (LOPRESSOR) 50 MG tablet Take 1 tablet (50 mg total) by mouth 2 (two) times daily. 60 tablet 0  . Multiple Vitamins-Minerals (MULTIVITAMIN WITH MINERALS)  tablet Take 1 tablet by mouth daily.    . pantoprazole (PROTONIX) 40 MG tablet Take 1 tablet (40 mg total) by mouth at bedtime.    Marland Kitchen QUEtiapine (SEROQUEL) 25 MG tablet Take 0.5 tablets (12.5 mg total) by mouth 2 (two) times daily. 30 tablet 0   No current facility-administered medications on file prior to visit.     Allergies:  No Known Allergies   Physical Exam  Vitals:   12/13/17 1237  BP: 120/76  Pulse: 70  Height: 5\' 10"  (1.778 m)   Body mass index is 22.81 kg/m. No exam data present  General: well developed, pleasant elderly Caucasian male, well nourished, seated, in no evident distress Head: head normocephalic and atraumatic.   Neck: supple with no carotid or supraclavicular bruits Cardiovascular: regular rate and rhythm, no murmurs Musculoskeletal: no deformity Skin:  no rash/petichiae Vascular:  Normal pulses all extremities  Neurologic Exam Mental Status: Awake and fully alert. Oriented to place and time. Recent and remote memory intact. Attention span, concentration and fund of knowledge appropriate. Mood and affect appropriate.  Cranial Nerves: Fundoscopic exam reveals sharp disc margins. Pupils equal, briskly reactive to light. Extraocular movements full without nystagmus. Visual Melott full to confrontation. Hearing intact. Facial sensation intact.  Minor right-sided facial paralysis Motor: Normal bulk and tone.  3-4/5 RUE, 2/5 RLE.  5/5 on left side Sensory.:  Decreased sensation in bilateral lower extremities distally Coordination: Decreased dexterity in right upper extremity.  Orbits left hand around right hand. Gait and Station: Patient currently sitting in wheelchair and states he is unable to stand but unable to take any steps. Reflexes: 1+ and symmetric. Toes downgoing.    NIHSS  6 (minor facial paralysis, right arm drift, right leg can't resist gravity, partial sensory loss, mild dysarthria) Modified Rankin  4 HAS-BLED 6 CHA2DS2-VASc 6   Diagnostic  Data (Labs, Imaging, Testing)  CT HEAD WITHOUT CONTRAST 10/27/2017 10:53 IMPRESSION: 1. Acute hemorrhage within the left pons, relatively large at up to 20 mm diameter (estimated blood volume 2 mL). No extra-axial or intraventricular extension at this time. No significant intracranial mass effect. No ventriculomegaly. 2. ASPECTS is not applicable, acute hemorrhage.  CT HEAD WITHOUT CONTRAST 10/27/2017 15:53 IMPRESSION: 1. Acute hemorrhage in the left pons is stable since 1045 hours today, blood volume estimated at 2 mL. 2. No new intracranial abnormality.  PORTABLE CHEST 1 VIEW 10/30/2017 00:17 IMPRESSION: Hypoventilatory chest with mild bibasilar atelectasis, which may be secondary to low lung volumes or seen with aspiration. No confluent airspace disease. Unchanged cardiomegaly  2D echo Study date: 11/07/2017 Impressions: - Technically difficult study. Atrial flutter is present. LVEF   55-60% by Definity contrast, mild MR, severe LAE.  CT head WO contrast Study date: 11/11/2017 IMPRESSION: 1. Near complete resolution previously noted left pontine hemorrhage now measuring approximately 8 x 2 mm in transverse by AP dimension. 2. Atrophy with moderate chronic small vessel ischemia, stable in appearance with chronic left corona radiata lacunar infarct. 3. No acute cervical spine fracture. Degenerative disc disease at C3-4 and C6-7 with multilevel degenerative facet arthropathy and ankylosed appearance of the facets at C4-5 bilaterally.      ASSESSMENT: [name] is a 84 y.o. year old male here with left pons ICH on 10/27/2017 secondary to possible underlying cavernoma versus hypertension plus warfarin related coagulopathy. Vascular risk factors include HTN, HLD, and atrial fibrillation.   PLAN: - continue lipitor  for secondary stroke prevention -F/u with PCP regarding your HLD and HTN management -continue to monitor BP at facility -f/u with Dr. Katrinka Blazing  cardiology for atrial fibrillation management -no AC for atrial fibrillation due to ICH -continue therapies - PT/OT/SLP -Maintain strict control of hypertension with blood pressure goal below 130/90, diabetes with hemoglobin A1c goal below 6.5% and cholesterol with LDL cholesterol (bad cholesterol) goal below 70 mg/dL. I also advised the patient to eat a healthy diet with plenty of whole grains, cereals, fruits and vegetables, exercise regularly and maintain ideal body weight.  Follow up  in 5 months or call earlier if needed  Greater than 50% time during this 25 minute consultation visit was spent on counseling and coordination of care about HLD, HTN and atrial fibrillation, discussion about risk benefit of anticoagulation and answering questions.   George HughJessica Mackay Hanauer, AGNP-BC  [facility] [address] [place], [place] [id number]  Phone [phone] Fax [phone]

## 2017-12-19 DIAGNOSIS — I69322 Dysarthria following cerebral infarction: Secondary | ICD-10-CM | POA: Diagnosis not present

## 2017-12-19 DIAGNOSIS — I69251 Hemiplegia and hemiparesis following other nontraumatic intracranial hemorrhage affecting right dominant side: Secondary | ICD-10-CM | POA: Diagnosis not present

## 2017-12-19 DIAGNOSIS — R2689 Other abnormalities of gait and mobility: Secondary | ICD-10-CM | POA: Diagnosis not present

## 2017-12-19 DIAGNOSIS — R1310 Dysphagia, unspecified: Secondary | ICD-10-CM | POA: Diagnosis not present

## 2017-12-26 ENCOUNTER — Telehealth: Payer: Self-pay | Admitting: *Deleted

## 2017-12-26 DIAGNOSIS — I69322 Dysarthria following cerebral infarction: Secondary | ICD-10-CM | POA: Diagnosis not present

## 2017-12-26 DIAGNOSIS — R1310 Dysphagia, unspecified: Secondary | ICD-10-CM | POA: Diagnosis not present

## 2017-12-26 DIAGNOSIS — R2689 Other abnormalities of gait and mobility: Secondary | ICD-10-CM | POA: Diagnosis not present

## 2017-12-26 DIAGNOSIS — I69251 Hemiplegia and hemiparesis following other nontraumatic intracranial hemorrhage affecting right dominant side: Secondary | ICD-10-CM | POA: Diagnosis not present

## 2017-12-26 NOTE — Telephone Encounter (Signed)
Called to schedule post hopsital visit at the request of Theodore Demark, PA.  ---Spoke with Mrs. Hilton she stated Mr. Babin is not "Ready" for any type of travel yet.  He is in a nursing home and she will call when he is able to come.

## 2017-12-31 DIAGNOSIS — I10 Essential (primary) hypertension: Secondary | ICD-10-CM | POA: Diagnosis not present

## 2017-12-31 DIAGNOSIS — I4891 Unspecified atrial fibrillation: Secondary | ICD-10-CM | POA: Diagnosis not present

## 2017-12-31 DIAGNOSIS — R600 Localized edema: Secondary | ICD-10-CM | POA: Diagnosis not present

## 2017-12-31 DIAGNOSIS — G8191 Hemiplegia, unspecified affecting right dominant side: Secondary | ICD-10-CM | POA: Diagnosis not present

## 2018-01-01 DIAGNOSIS — R1310 Dysphagia, unspecified: Secondary | ICD-10-CM | POA: Diagnosis not present

## 2018-01-01 DIAGNOSIS — I69322 Dysarthria following cerebral infarction: Secondary | ICD-10-CM | POA: Diagnosis not present

## 2018-01-01 DIAGNOSIS — I69251 Hemiplegia and hemiparesis following other nontraumatic intracranial hemorrhage affecting right dominant side: Secondary | ICD-10-CM | POA: Diagnosis not present

## 2018-01-01 DIAGNOSIS — R2689 Other abnormalities of gait and mobility: Secondary | ICD-10-CM | POA: Diagnosis not present

## 2018-01-07 ENCOUNTER — Inpatient Hospital Stay (HOSPITAL_COMMUNITY): Payer: Medicare HMO

## 2018-01-07 ENCOUNTER — Encounter (HOSPITAL_COMMUNITY): Payer: Self-pay | Admitting: *Deleted

## 2018-01-07 ENCOUNTER — Inpatient Hospital Stay (HOSPITAL_COMMUNITY)
Admission: EM | Admit: 2018-01-07 | Discharge: 2018-01-10 | DRG: 064 | Disposition: A | Discharge status: Hospice | Payer: Medicare HMO | Attending: Student in an Organized Health Care Education/Training Program | Admitting: Student in an Organized Health Care Education/Training Program

## 2018-01-07 ENCOUNTER — Emergency Department (HOSPITAL_COMMUNITY): Payer: Medicare HMO

## 2018-01-07 DIAGNOSIS — I739 Peripheral vascular disease, unspecified: Secondary | ICD-10-CM | POA: Diagnosis present

## 2018-01-07 DIAGNOSIS — R29810 Facial weakness: Secondary | ICD-10-CM | POA: Diagnosis present

## 2018-01-07 DIAGNOSIS — R1310 Dysphagia, unspecified: Secondary | ICD-10-CM | POA: Diagnosis not present

## 2018-01-07 DIAGNOSIS — R4701 Aphasia: Secondary | ICD-10-CM | POA: Diagnosis present

## 2018-01-07 DIAGNOSIS — I63233 Cerebral infarction due to unspecified occlusion or stenosis of bilateral carotid arteries: Secondary | ICD-10-CM | POA: Diagnosis not present

## 2018-01-07 DIAGNOSIS — R0989 Other specified symptoms and signs involving the circulatory and respiratory systems: Secondary | ICD-10-CM | POA: Diagnosis not present

## 2018-01-07 DIAGNOSIS — R402142 Coma scale, eyes open, spontaneous, at arrival to emergency department: Secondary | ICD-10-CM | POA: Diagnosis present

## 2018-01-07 DIAGNOSIS — Z79899 Other long term (current) drug therapy: Secondary | ICD-10-CM | POA: Diagnosis not present

## 2018-01-07 DIAGNOSIS — I6523 Occlusion and stenosis of bilateral carotid arteries: Secondary | ICD-10-CM | POA: Diagnosis present

## 2018-01-07 DIAGNOSIS — R52 Pain, unspecified: Secondary | ICD-10-CM | POA: Diagnosis not present

## 2018-01-07 DIAGNOSIS — R1312 Dysphagia, oropharyngeal phase: Secondary | ICD-10-CM | POA: Diagnosis not present

## 2018-01-07 DIAGNOSIS — Z8673 Personal history of transient ischemic attack (TIA), and cerebral infarction without residual deficits: Secondary | ICD-10-CM | POA: Diagnosis not present

## 2018-01-07 DIAGNOSIS — I4892 Unspecified atrial flutter: Secondary | ICD-10-CM | POA: Diagnosis not present

## 2018-01-07 DIAGNOSIS — I10 Essential (primary) hypertension: Secondary | ICD-10-CM | POA: Diagnosis present

## 2018-01-07 DIAGNOSIS — R402222 Coma scale, best verbal response, incomprehensible words, at arrival to emergency department: Secondary | ICD-10-CM | POA: Diagnosis present

## 2018-01-07 DIAGNOSIS — G460 Middle cerebral artery syndrome: Secondary | ICD-10-CM | POA: Diagnosis present

## 2018-01-07 DIAGNOSIS — R402352 Coma scale, best motor response, localizes pain, at arrival to emergency department: Secondary | ICD-10-CM | POA: Diagnosis present

## 2018-01-07 DIAGNOSIS — H53461 Homonymous bilateral field defects, right side: Secondary | ICD-10-CM | POA: Diagnosis present

## 2018-01-07 DIAGNOSIS — R451 Restlessness and agitation: Secondary | ICD-10-CM | POA: Diagnosis present

## 2018-01-07 DIAGNOSIS — I639 Cerebral infarction, unspecified: Secondary | ICD-10-CM | POA: Diagnosis present

## 2018-01-07 DIAGNOSIS — I69351 Hemiplegia and hemiparesis following cerebral infarction affecting right dominant side: Secondary | ICD-10-CM

## 2018-01-07 DIAGNOSIS — I700 Atherosclerosis of aorta: Secondary | ICD-10-CM | POA: Diagnosis present

## 2018-01-07 DIAGNOSIS — I340 Nonrheumatic mitral (valve) insufficiency: Secondary | ICD-10-CM | POA: Diagnosis not present

## 2018-01-07 DIAGNOSIS — I6789 Other cerebrovascular disease: Secondary | ICD-10-CM | POA: Diagnosis not present

## 2018-01-07 DIAGNOSIS — R063 Periodic breathing: Secondary | ICD-10-CM | POA: Diagnosis present

## 2018-01-07 DIAGNOSIS — K219 Gastro-esophageal reflux disease without esophagitis: Secondary | ICD-10-CM | POA: Diagnosis present

## 2018-01-07 DIAGNOSIS — I4891 Unspecified atrial fibrillation: Secondary | ICD-10-CM

## 2018-01-07 DIAGNOSIS — I63412 Cerebral infarction due to embolism of left middle cerebral artery: Principal | ICD-10-CM | POA: Diagnosis present

## 2018-01-07 DIAGNOSIS — Z66 Do not resuscitate: Secondary | ICD-10-CM | POA: Diagnosis not present

## 2018-01-07 DIAGNOSIS — Z87820 Personal history of traumatic brain injury: Secondary | ICD-10-CM | POA: Diagnosis not present

## 2018-01-07 DIAGNOSIS — Z7401 Bed confinement status: Secondary | ICD-10-CM | POA: Diagnosis not present

## 2018-01-07 DIAGNOSIS — R29723 NIHSS score 23: Secondary | ICD-10-CM | POA: Diagnosis present

## 2018-01-07 DIAGNOSIS — I63512 Cerebral infarction due to unspecified occlusion or stenosis of left middle cerebral artery: Secondary | ICD-10-CM | POA: Diagnosis not present

## 2018-01-07 DIAGNOSIS — E7800 Pure hypercholesterolemia, unspecified: Secondary | ICD-10-CM | POA: Diagnosis present

## 2018-01-07 DIAGNOSIS — Z515 Encounter for palliative care: Secondary | ICD-10-CM

## 2018-01-07 DIAGNOSIS — G8101 Flaccid hemiplegia affecting right dominant side: Secondary | ICD-10-CM | POA: Diagnosis not present

## 2018-01-07 DIAGNOSIS — I69251 Hemiplegia and hemiparesis following other nontraumatic intracranial hemorrhage affecting right dominant side: Secondary | ICD-10-CM | POA: Diagnosis not present

## 2018-01-07 DIAGNOSIS — E785 Hyperlipidemia, unspecified: Secondary | ICD-10-CM | POA: Diagnosis present

## 2018-01-07 DIAGNOSIS — I6300 Cerebral infarction due to thrombosis of unspecified precerebral artery: Secondary | ICD-10-CM | POA: Diagnosis not present

## 2018-01-07 DIAGNOSIS — H538 Other visual disturbances: Secondary | ICD-10-CM | POA: Diagnosis not present

## 2018-01-07 DIAGNOSIS — M2550 Pain in unspecified joint: Secondary | ICD-10-CM | POA: Diagnosis not present

## 2018-01-07 LAB — URINALYSIS, ROUTINE W REFLEX MICROSCOPIC
BACTERIA UA: NONE SEEN
BILIRUBIN URINE: NEGATIVE
GLUCOSE, UA: NEGATIVE mg/dL
Hgb urine dipstick: NEGATIVE
Ketones, ur: NEGATIVE mg/dL
NITRITE: NEGATIVE
Protein, ur: NEGATIVE mg/dL
SPECIFIC GRAVITY, URINE: 1.013 (ref 1.005–1.030)
pH: 7 (ref 5.0–8.0)

## 2018-01-07 LAB — CBC
HCT: 40.7 % (ref 39.0–52.0)
HEMOGLOBIN: 13.7 g/dL (ref 13.0–17.0)
MCH: 33.9 pg (ref 26.0–34.0)
MCHC: 33.7 g/dL (ref 30.0–36.0)
MCV: 100.7 fL — ABNORMAL HIGH (ref 78.0–100.0)
PLATELETS: 278 10*3/uL (ref 150–400)
RBC: 4.04 MIL/uL — ABNORMAL LOW (ref 4.22–5.81)
RDW: 13.4 % (ref 11.5–15.5)
WBC: 10.4 10*3/uL (ref 4.0–10.5)

## 2018-01-07 LAB — DIFFERENTIAL
BASOS ABS: 0 10*3/uL (ref 0.0–0.1)
BASOS PCT: 0 %
EOS ABS: 0.4 10*3/uL (ref 0.0–0.7)
EOS PCT: 4 %
Lymphocytes Relative: 32 %
Lymphs Abs: 3.3 10*3/uL (ref 0.7–4.0)
Monocytes Absolute: 0.7 10*3/uL (ref 0.1–1.0)
Monocytes Relative: 7 %
NEUTROS PCT: 57 %
Neutro Abs: 5.9 10*3/uL (ref 1.7–7.7)

## 2018-01-07 LAB — APTT: APTT: 31 s (ref 24–36)

## 2018-01-07 LAB — I-STAT CHEM 8, ED
BUN: 14 mg/dL (ref 6–20)
CREATININE: 0.9 mg/dL (ref 0.61–1.24)
Calcium, Ion: 1.17 mmol/L (ref 1.15–1.40)
Chloride: 100 mmol/L — ABNORMAL LOW (ref 101–111)
Glucose, Bld: 119 mg/dL — ABNORMAL HIGH (ref 65–99)
HEMATOCRIT: 40 % (ref 39.0–52.0)
HEMOGLOBIN: 13.6 g/dL (ref 13.0–17.0)
POTASSIUM: 3.7 mmol/L (ref 3.5–5.1)
SODIUM: 139 mmol/L (ref 135–145)
TCO2: 27 mmol/L (ref 22–32)

## 2018-01-07 LAB — PROTIME-INR
INR: 1.11
PROTHROMBIN TIME: 14.2 s (ref 11.4–15.2)

## 2018-01-07 LAB — COMPREHENSIVE METABOLIC PANEL
ALBUMIN: 2.8 g/dL — AB (ref 3.5–5.0)
ALT: 38 U/L (ref 17–63)
ANION GAP: 7 (ref 5–15)
AST: 45 U/L — AB (ref 15–41)
Alkaline Phosphatase: 167 U/L — ABNORMAL HIGH (ref 38–126)
BUN: 13 mg/dL (ref 6–20)
CHLORIDE: 103 mmol/L (ref 101–111)
CO2: 27 mmol/L (ref 22–32)
Calcium: 9 mg/dL (ref 8.9–10.3)
Creatinine, Ser: 1.06 mg/dL (ref 0.61–1.24)
GFR calc Af Amer: >60 mL/min (ref >60)
Glucose, Bld: 122 mg/dL — ABNORMAL HIGH (ref 65–99)
POTASSIUM: 3.8 mmol/L (ref 3.5–5.1)
Sodium: 137 mmol/L (ref 135–145)
Total Bilirubin: 0.9 mg/dL (ref 0.3–1.2)
Total Protein: 6.3 g/dL — ABNORMAL LOW (ref 6.5–8.1)

## 2018-01-07 LAB — I-STAT TROPONIN, ED: TROPONIN I, POC: 0.02 ng/mL (ref 0.00–0.08)

## 2018-01-07 LAB — ECHOCARDIOGRAM COMPLETE

## 2018-01-07 LAB — ETHANOL: Alcohol, Ethyl (B): <10 mg/dL (ref <10)

## 2018-01-07 MED ORDER — ACETAMINOPHEN 650 MG RE SUPP: 650.0000 mg | RECTAL | Status: DC | PRN

## 2018-01-07 MED ORDER — METOPROLOL TARTRATE 5 MG/5ML IV SOLN
5.0000 mg | INTRAVENOUS | Status: DC | PRN
  Administered 2018-01-07: 5 mg via INTRAVENOUS
  Filled 2018-01-07: qty 5

## 2018-01-07 MED ORDER — SODIUM CHLORIDE 0.9 % IV SOLN
INTRAVENOUS | Status: DC
  Administered 2018-01-07: 21:00:00 via INTRAVENOUS

## 2018-01-07 MED ORDER — STROKE: EARLY STAGES OF RECOVERY BOOK
Freq: Once | Status: AC
  Administered 2018-01-07: 21:00:00
  Filled 2018-01-07: qty 1

## 2018-01-07 MED ORDER — ACETAMINOPHEN 160 MG/5ML PO SOLN: 650.0000 mg | ORAL | Status: DC | PRN

## 2018-01-07 MED ORDER — IOPAMIDOL (ISOVUE-370) INJECTION 76%
INTRAVENOUS | Status: AC
  Administered 2018-01-07: 50 mL
  Filled 2018-01-07: qty 50

## 2018-01-07 MED ORDER — METOPROLOL TARTRATE 5 MG/5ML IV SOLN: 2.5000 mg | INTRAVENOUS | Status: DC | PRN

## 2018-01-07 MED ORDER — ACETAMINOPHEN 325 MG PO TABS: 650.0000 mg | ORAL_TABLET | ORAL | Status: DC | PRN

## 2018-01-07 NOTE — Consult Note (Signed)
NEURO HOSPITALIST CONSULT NOTE   Requesting Physician: Dr. Denton Lank    Chief Complaint: Stroke  History obtained from:  Patient   Chart  Patient and Chart      HPI:                                                                                                                                         [name] is an 84 y.o. male who has a past medical history of atrial fibrillation was on anticoagulation till March 2019 when he had a left pontine bleed and his anticoagulation was discontinued, who was recovering from his left pontine bleed of March in a skilled nursing facility, was brought in for evaluation of sudden onset of right-sided flaccid paralysis, left gaze preference and aphasia. According to history provided by EMS, patient was with physical therapy participating in therapy when he had a sudden onset of weakness of the right side.  His right arm become flaccid.  His right leg also became weak.  He started looking to the left and had difficulty bringing words out.  He was not following any commands. There is no seizure activity noted. Patient had recently seen Dr. Marvel Plan in the stroke clinic for follow-up after his pontine bleed and had residual baseline right hemiparesis.  He was a modified Rankin score of 4. Later on his daughter arrived and told me that he has been walking with a walker with assistance and has not been able to walk independently with a walker ever since the last stroke. He was taken for a stat noncontrast CT of the head which showed possible hyperdense MCA on the left with chronic white matter changes, this was followed by a CT angiogram of the head that showed a left M1 partial occlusion with recanalization distally.  Date last known well: Date: 01/07/2018 10:50 AM Time last known well: Time: 10:50 tPA Given: No: 10/27/2017 Large left Pons Hemorrhagic stroke Modified Rankin: Rankin Score=4  Past Medical History:  Diagnosis Date  .  Atrial fibrillation (HCC)   . Atrial flutter (HCC)   . Atrial flutter (HCC)   . Edema 03/17/2009   Qualifier: Diagnosis of  By: Flonnie Overman    . GERD (gastroesophageal reflux disease)   . HTN (hypertension)   . Hypercholesteremia   . Hyperlipidemia   . Stroke [facility])     No past surgical history on file.  Family History  Problem Relation Age of Onset  . Healthy Mother   . Other Father        "too much iron in blood"  . Healthy Sister    Social History:  reports that he has never smoked. He has never used smokeless tobacco. He reports that he does not drink alcohol or use drugs.  Allergies: No Known Allergies  Medications:  No current facility-administered medications for this encounter.    Current Outpatient Medications  Medication Sig Dispense Refill  . atorvastatin (LIPITOR) 20 MG tablet Take 3 tablets (60 mg total) by mouth daily at 6 PM. 90 tablet 0  . Calcium Carbonate-Vitamin D (CALCIUM 600+D3 PO) Take 1 tablet by mouth daily.     . Cholecalciferol (VITAMIN D-3) 1000 units CAPS Take 1,000 Units by mouth daily.    Marland Kitchen diltiazem (CARDIZEM) 90 MG tablet Take 1 tablet (90 mg total) by mouth every 8 (eight) hours. 30 tablet 0  . docusate sodium (COLACE) 100 MG capsule Take 1 capsule (100 mg total) by mouth 2 (two) times daily. 60 capsule 1  . metoprolol tartrate (LOPRESSOR) 50 MG tablet Take 1 tablet (50 mg total) by mouth 2 (two) times daily. 60 tablet 0  . Multiple Vitamins-Minerals (MULTIVITAMIN WITH MINERALS) tablet Take 1 tablet by mouth daily.    . pantoprazole (PROTONIX) 40 MG tablet Take 1 tablet (40 mg total) by mouth at bedtime.    Marland Kitchen QUEtiapine (SEROQUEL) 25 MG tablet Take 0.5 tablets (12.5 mg total) by mouth 2 (two) times daily. 30 tablet 0   ROS:                                                                                                                                        History obtained from chart review  General ROS: negative for - chills, fatigue, fever, night sweats, weight gain or weight loss Psychological ROS: negative for - , hallucinations, memory difficulties, mood swings or  Ophthalmic ROS: negative for - blurry vision, double vision, eye pain or loss of vision ENT ROS: negative for - epistaxis, nasal discharge, oral lesions, sore throat, tinnitus or vertigo Respiratory ROS: negative for - cough,  shortness of breath or wheezing Cardiovascular ROS: negative for - chest pain, dyspnea on exertion,  Gastrointestinal ROS: negative for - abdominal pain, diarrhea,  nausea/vomiting or stool incontinence Genito-Urinary ROS: negative for - dysuria, hematuria, incontinence or urinary frequency/urgency Musculoskeletal ROS: negative for - joint swelling or muscular weakness Neurological ROS: as noted in HPI   General Examination:                                                                                                      There were no vitals taken for this visit.  HEENT-  Normocephalic, no lesions, without obvious abnormality.  Normal external eye and conjunctiva.   Cardiovascular- S1-S2 audible, pulses palpable throughout  Lungs-no rhonchi or wheezing noted, no excessive working breathing.  Saturations within normal limits Abdomen- All 4 quadrants palpated and nontender Extremities- Warm, dry and intact Musculoskeletal-no joint tenderness, deformity or swelling Skin-warm and dry, no hyperpigmentation, vitiligo, or suspicious lesions  Neurological Examination Mental Status: Patient is awake, alert. He is not able to follow verbal commands. He is able to mimic some on the left side. His speech is very mumbled.  He does try to verbalize but is completely and compensable. Cranial nerves: Pupils equal round reactive to light, he has leftward gaze preference and cannot cross midline to look to the right, he has right  homonymous hemianopsia that was demonstrated by no blink to threat, he has right lower facial weakness. Motor exam: Left upper and lower extremity are full strength.  Right upper extremity 0/5, right lower extremity 2/5. Sensory exam: Dense hemisensory loss on the right hemibody Coordination: Cannot assess Gait cannot assess    Lab Results: Basic Metabolic Panel: Recent Labs  Lab 01/07/18 1143 01/07/18 1152  NA 137 139  K 3.8 3.7  CL 103 100*  CO2 27  --   GLUCOSE 122* 119*  BUN 13 14  CREATININE 1.06 0.90  CALCIUM 9.0  --     CBC: Recent Labs  Lab 01/07/18 1143 01/07/18 1152  WBC 10.4  --   NEUTROABS 5.9  --   HGB 13.7 13.6  HCT 40.7 40.0  MCV 100.7*  --   PLT 278  --   EMS reported CBC within normal range  Imaging: Ct Head Code Stroke Wo Contrast  Result Date: 01/07/2018 CLINICAL DATA:  Code stroke. Right-sided weakness. Atrial fibrillation off anticoagulation. Recent pontine bleed. EXAM: CT HEAD WITHOUT CONTRAST TECHNIQUE: Contiguous axial images were obtained from the base of the skull through the vertex without intravenous contrast. COMPARISON:  None. FINDINGS: Brain: Moderate atrophy. Moderate chronic microvascular ischemic changes in the white matter. Chronic lacunar infarction in the left corona radiata is unchanged. Hypodensity left pons compatible with prior hemorrhage. Negative for acute hemorrhage. Negative for acute infarct or mass. No midline shift. Vascular: Asymmetric hyperdensity of the left MCA with abrupt cutoff suggesting MCA thrombus. Skull: Negative Sinuses/Orbits: Negative Other: None ASPECTS (Alberta Stroke Program Early CT Score) - Ganglionic level infarction (caudate, lentiform nuclei, internal capsule, insula, M1-M3 cortex): 7 - Supraganglionic infarction (M4-M6 cortex): 3 Total score (0-10 with 10 being normal): 10 IMPRESSION: 1. Negative for acute infarct. Findings are very suspicious for acute left MCA thrombosis. 2. ASPECTS is 10 3. Atrophy and  chronic microvascular ischemic changes. Negative for acute hemorrhage. 4. These results were discussed directly at the time of interpretation on 01/07/2018 at 11:56 am to Dr. Wilford Corner, who verbally acknowledged these results. Electronically Signed   By: Marlan Palau M.D.   On: 01/07/2018 11:57   Assessment and plan discussed with with attending physician and they are in agreement.    01/07/2018, 11:48 AM   Attending addendum Agree with the history and physical documented above. I have made my changes in the note above. I have independently performed exam  I have reviewed the images CT head shows chronic white matter changes in possible left hyperdense MCA.  Aspect score 10. CT angiogram shows a left M1 partial occlusion with distal recanalization.   Assessment: 84 y.o. male past medical history of atrial fibrillation, currently not on anticoagulation, recent left pontine bleed in March 2019, presenting with sudden onset of left gaze preference, right facial weakness and right hemiplegia on top of  the residual right hemiparesis from the prior pontine bleed. Exam consistent with a left MCA syndrome. CT angiogram of the head and neck shows left M1 occlusion with distal recanalization. Given his poor baseline modified Rankin score, he is not a candidate for intervention. I have discussed this case with the interventionalist as well as stroke attending on service. I have also relayed this to his daughter, who is an Char[employer] fundraiser. At this point, I would recommend admission for closer neuro monitoring as he is expected to have a large MCA stroke but given that he has partial recanalization, this remains to be seen.   Recommend --Admit to stepdown. --HgbA1c, fasting lipid panel --MRI brain without contrast. --PT consult, OT consult, Speech consult --Echocardiogram --80 mg of Atorvistatin --Given the recent bleed, avoid antiplatelets.  Further recommendations per stroke team. --Telemetry  monitoring --Frequent neuro checks --NPO until passes stroke swallow screen --I discussed in detail with the daughter the rationale for not offering TPA-which is the recent bleed as well as not offering endovascular intervention given his poor baseline function with a modified Rankin score of 4. I answered all the questions with the family. I relayed the plan to Dr. Denton Lank, ED provider  --please page stroke NP  Or  PA  Or MD from 8am -4 pm  as this patient from this time will be  followed by the stroke.   You can look them up on [email or web address]  Password [email or web address]  -- Milon Dikes, MD [facility] Pager: [phone] If 7pm to 7am, please call on call as listed on AMION.

## 2018-01-07 NOTE — Progress Notes (Signed)
Central telemetry called and stated, HR = 170, neurology on call notified

## 2018-01-07 NOTE — Progress Notes (Signed)
  Echocardiogram 2D Echocardiogram has been performed.  [name] 01/07/2018, 4:16 PM

## 2018-01-07 NOTE — H&P (Signed)
Date: 01/07/2018               Patient Name:  [name] MRN: [id number]  DOB: [birth year] Age / Sex: 84 y.o., male   PCP: Patient, No Pcp Per         Medical Service: Internal Medicine Teaching Service         Attending Physician: Dr. Doneen Poisson, MD    First Contact: Dr. Renaldo Reel Pager: [phone]  Second Contact: Dr. Antony Contras Pager: [phone]       After Hours (After 5p/  First Contact Pager: [phone]  weekends / holidays): Second Contact Pager: [phone]   Chief Complaint: right sided weakness and right sided facial droop.  History of Present Illness:  [name] is an 84yo male with PMH of atrial fibrillation (not on anticoagulation due to left pontine bleed in 10/2017), HTN, hyperlipidemia, and hx of CVA who presents with right sided weakness and facial droop. Daughter and son-in-law at bedside who provide additional history. Patient nonverbal and with AMS, limiting ability to obtain history from patient.   He was admitted 3/2-3/13/2019 for left pontine intracerebral hemorrhage, which presented with right sided weakness, right sided facial droop, dysarthria, and swallowing issues. At the time of discharge, he had persistent right sided weakness and swallowing issues. Warfarin was also discontinued on discharge in the setting of hemorrhagic stroke. He was discharged to SNF for acute rehab with the eventual plan to return home. At the facility, daughter reports that he had been making some improvements in his weakness and was tolerating a mechanical soft diet. He was able to take his own medications as long as staff at the facility crushed up his meds. They had planned to do a home visit today to hopefully return home soon.   Daughter reports that she saw the patient this morning at around 8:30am and he was at his baseline, able to feed himself breakfast and converse normally. He had no complaints when his daughter saw him. He was getting ready to work with PT a little before noon  today when he was noted to be weak on the right sided, had right facial droop, and was nonverbal. Upon my evaluation, patient is nonverbal and does not respond to questions.  At baseline, patient is alert and oriented. Prior to his admission in March, he was independently living at home. No smoking or alcohol use. Patient does not have a PCP, but sees a cardiologist and neurologist.  ED Course: - BP 149/79, temp 97.4, RR 21, HR 57, O2 98% on RA - CMP with tprot 6.3, alb 2.8, AST 45, AP 167. CBC wnl. Trop 0.02. INR 1.11 - EKG with PAC, low voltage, poor R wave progression, T wave inversion in V1 and lead III, HR 60 - CT head with concern for acute left MCA thrombosis. CT-A head with left ICA occlusion, poor reconstitution of L MCA branch vessels, left ACA reconstitution, and 50% proximal right ICA stenosis. - Neuro consulted by EDP  Meds:  Current Meds  Medication Sig  . atorvastatin (LIPITOR) 20 MG tablet Take 3 tablets (60 mg total) by mouth daily at 6 PM.  . Calcium Carbonate-Vitamin D (CALCIUM 600+D3 PO) Take 1 tablet by mouth daily.   . Cholecalciferol (VITAMIN D-3) 1000 units CAPS Take 1,000 Units by mouth daily.  Marland Kitchen diltiazem (CARDIZEM) 90 MG tablet Take 1 tablet (90 mg total) by mouth every 8 (eight) hours.  . metoprolol tartrate (LOPRESSOR) 50 MG tablet Take 1 tablet (  50 mg total) by mouth 2 (two) times daily.  . Multiple Vitamins-Minerals (MULTIVITAMIN WITH MINERALS) tablet Take 1 tablet by mouth daily.  . pantoprazole (PROTONIX) 40 MG tablet Take 1 tablet (40 mg total) by mouth at bedtime.  . polyethylene glycol (MIRALAX / GLYCOLAX) packet Take 17 g by mouth daily.  . QUEtiapine (SEROQUEL) 25 MG tablet Take 0.5 tablets (12.5 mg total) by mouth 2 (two) times daily.   Allergies: Allergies as of 01/07/2018  . (No Known Allergies)   Past Medical History:  Diagnosis Date  . Atrial fibrillation (HCC)   . Atrial flutter (HCC)   . Atrial flutter (HCC)   . Edema 03/17/2009    Qualifier: Diagnosis of  By: Flonnie Overman    . GERD (gastroesophageal reflux disease)   . HTN (hypertension)   . Hypercholesteremia   . Hyperlipidemia   . Stroke [facility])    Family History:  Family History  Problem Relation Age of Onset  . Healthy Mother   . Other Father        "too much iron in blood"  . Healthy Sister    Social History:  At baseline, patient is alert and oriented. Prior to his admission in March, he was independently living at home. Was most recently living in SNF for acute rehab after hemorrhagic stroke in March 2019. No smoking or alcohol use.   Review of Systems: A complete ROS was unable to be completed 2/2 AMS  Physical Exam: Blood pressure 132/67, pulse (!) 59, temperature 97.9 F (36.6 C), temperature source Oral, resp. rate 16, weight 169 lb 1.5 oz (76.7 kg), SpO2 100 %. GEN: Elderly male lying in bed. Not alert. Intermittently opens eyes to voice, but closes eyes quickly. Nonverbal. NAD. HENT: [employer]/AT. Unable to assess mucous membranes. EYES: PERRL. Sclera non-icteric. Conjunctiva clear. RESP: Clear to auscultation bilaterally. No wheezes, rales, or rhonchi. No increased work of breathing. CV: Normal rate and regular rhythm. No murmurs, gallops, or rubs. No carotid bruits. ABD: Soft. Non-tender. Non-distended. Normoactive bowel sounds. EXT: Edema of LLE>RLE. NEURO: Unable to complete full neuro exam 2/2 AMS. Moving LUE and LLE spontaneously. Not moving RUE or RLE. Nonverbal. PSYCH: Patient is calm. Unable to assess 2/2 AMS.  Labs CBC Latest Ref Rng & Units 01/07/2018 01/07/2018 11/16/2017  WBC 4.0 - 10.5 K/uL - 10.4 12.6(H)  Hemoglobin 13.0 - 17.0 g/dL 40.9 81.1 91.4  Hematocrit 39.0 - 52.0 % 40.0 40.7 42.8  Platelets 150 - 400 K/uL - 278 318   CMP Latest Ref Rng & Units 01/07/2018 01/07/2018 11/16/2017  Glucose 65 - 99 mg/dL 782(N) 562(Z) 308(M)  BUN 6 - 20 mg/dL Creatinine 0.61 - 1.24 mg/dL 5.78 4.69 6.29  Sodium 135 - 145 mmol/L 139 137 140   Potassium 3.5 - 5.1 mmol/L 3.7 3.8 3.7  Chloride 101 - 111 mmol/L 100(L) 103 106  CO2 22 - 32 mmol/L - 27 23  Calcium 8.9 - 10.3 mg/dL - 9.0 5.2(W)  Total Protein 6.5 - 8.1 g/dL - 6.3(L) -  Total Bilirubin 0.3 - 1.2 mg/dL - 0.9 -  Alkaline Phos 38 - 126 U/L - 167(H) -  AST 15 - 41 U/L - 45(H) -  ALT 17 - 63 U/L - 38 -   INR 1.11, PTT 31 sec Ethyl alcohol <10 Trop 0.02  EKG: personally reviewed my interpretation is PAC, low voltage, poor R wave progression, T wave inversion in V1 and lead III, HR 60  CT head 1.  Negative for acute infarct. Findings are very suspicious for acute left MCA thrombosis. 2. ASPECTS is 10 3. Atrophy and chronic microvascular ischemic changes. Negative for acute hemorrhage.  CT-A head/neck 1. Left ICA occlusion from its mid cervical segment through the terminus. 2. Poor reconstitution of left MCA branch vessels. 3. Left ACA reconstitution via the anterior communicating artery. 4. 50% proximal right ICA stenosis. 5.  Aortic Atherosclerosis (ICD10-I70.0).  Assessment & Plan by Problem: Principal Problem:   Stroke [facility])  Mr. Allbaugh is an 83yo male with PMH of atrial fibrillation (not on anticoagulation due to left pontine bleed in 10/2017), HTN, hyperlipidemia, and hx of left pontine hemorrhagic stroke with residual right-sided paraplegia who presents with right sided weakness and facial droop that started today. Patient not a candidate for tPA, per Neuro. Neuro has been consulted, difficult to say what his outcome will be given the partial recanalization.   Acute CVA of left MCA Hx of left pontine hemorrhagic stroke in 10/2017 CT-A head/neck confirms left ICA occlusion with poor reconstitution of L MCA branch vessels and left ACA reconstitution. Patient has a hx of afib and was not on anticoagulation/antiplatelet therapy as of March 2019 due to intracranial bleed. TTE from last admission in 10/2017 showed LVEF 55-60% with aflutter, mild MR, and severe  LAE. A1c 5.8 and LDL 74. - Admit to stepdown - Neurology consulted; appreciate their assistance - Telemetry - Allow for permissive HTN in the setting of CVA (systolic < 220 and diastolic < 120) - Echocardiogram - MRI brain - Hemoglobin A1c, Lipid panel - Holding antiplatelets due to recent hemorrhagic stroke - Atorvastatin  - NPO until SLP eval - Aspiration precautions - IV NS 50cc/hr - PT/OT eval - UA  Hx of afib Previously on warfarin, however this was discontinued in March 2019 due to hemorrhagic stroke. Currently rate controlled and in sinus rhythm. - Holding home metoprolol and diltiazem 2/2 NPO status - Continue to monitor  Hx of HTN Home regimen includes diltiazem  q8h and metoprolol  BID - Holding home diltiazem and metoprolol  Diet: NPO VTE PPx: SCDs Code Status: DNR/DNI (confirmed with daughter on admission) Dispo: Admit patient to Inpatient with expected length of stay greater than 2 midnights.  Signed: Scherrie Gerlach, MD 01/07/2018, 6:38 PM  Pager: Demetrius Charity [phone]

## 2018-01-07 NOTE — ED Provider Notes (Signed)
[facility] EMERGENCY DEPARTMENT Provider Note   CSN: [id number] Arrival date & time: 01/07/18  1139     History   Chief Complaint Chief Complaint  Patient presents with  . Code Stroke    HPI Patient is a 84 year old male with history of atrial fibrillation not currently on anticoagulation due to left pontine ICH in March 2019 who presents with acute onset of right-sided hemiplegia, facial droop, and aphasia.  Patient was working with physical therapy at his skilled nursing facility when he was noted to have the above symptoms.  Last known normal was approximately 10:50 AM.  Patient is unable to provide any additional history.  Past Medical History:  Diagnosis Date  . Atrial fibrillation (HCC)   . Atrial flutter (HCC)   . Atrial flutter (HCC)   . Edema 03/17/2009   Qualifier: Diagnosis of  By: Flonnie Overman    . GERD (gastroesophageal reflux disease)   . HTN (hypertension)   . Hypercholesteremia   . Hyperlipidemia   . Stroke [facility])     Patient Active Problem List   Diagnosis Date Noted  . Stroke (HCC) 01/07/2018  . Atrial fibrillation with rapid ventricular response (HCC) 11/11/2017  . Dysphagia following intracerebral hemorrhage 11/07/2017  . Right hemiplegia (HCC) d/t ICH 11/07/2017  . CHF (congestive heart failure) (HCC)   . Atrial flutter with rapid ventricular response (HCC)   . Leukocytosis   . L Pontine ICH (intracerebral hemorrhage) (HCC) 10/27/2017  . Chronic anticoagulation 11/19/2016  . Right bundle branch block 09/08/2014  . Atrial fibrillation with RVR (HCC) 06/30/2013  . Weight loss 06/03/2013    Class: Chronic  . Hyperlipidemia   . Essential hypertension 03/17/2009  . Atrial flutter (HCC) 03/17/2009  . Other specified cardiac dysrhythmias(427.89) 03/17/2009  . EDEMA 03/17/2009    History reviewed. No pertinent surgical history.      Home Medications    Prior to Admission medications   Medication Sig Start Date End Date  Taking? Authorizing Provider  atorvastatin (LIPITOR) 20 MG tablet Take 3 tablets (60 mg total) by mouth daily at 6 PM. 11/16/17  Yes Drema Dallas, MD  Calcium Carbonate-Vitamin D (CALCIUM 600+D3 PO) Take 1 tablet by mouth daily.    Yes [provider]  Cholecalciferol (VITAMIN D-3) 1000 units CAPS Take 1,000 Units by mouth daily.   Yes [provider]  diltiazem (CARDIZEM) 90 MG tablet Take 1 tablet (90 mg total) by mouth every 8 (eight) hours. 11/16/17  Yes Drema Dallas, MD  metoprolol tartrate (LOPRESSOR) 50 MG tablet Take 1 tablet (50 mg total) by mouth 2 (two) times daily. 11/16/17  Yes Drema Dallas, MD  Multiple Vitamins-Minerals (MULTIVITAMIN WITH MINERALS) tablet Take 1 tablet by mouth daily.   Yes [provider]  pantoprazole (PROTONIX) 40 MG tablet Take 1 tablet (40 mg total) by mouth at bedtime. 11/07/17  Yes Layne Benton, NP  polyethylene glycol (MIRALAX / GLYCOLAX) packet Take 17 g by mouth daily.   Yes [provider]  QUEtiapine (SEROQUEL) 25 MG tablet Take 0.5 tablets (12.5 mg total) by mouth 2 (two) times daily. 11/07/17  Yes Layne Benton, NP  docusate sodium (COLACE) 100 MG capsule Take 1 capsule (100 mg total) by mouth 2 (two) times daily. Patient not taking: Reported on 01/07/2018 11/07/17 11/07/18  Layne Benton, NP    Family History Family History  Problem Relation Age of Onset  . Healthy Mother   . Other Father        "  too much iron in blood"  . Healthy Sister     Social History Social History   Tobacco Use  . Smoking status: Never Smoker  . Smokeless tobacco: Never Used  Substance Use Topics  . Alcohol use: No    Alcohol/week: 0.0 oz  . Drug use: No     Allergies   Patient has no known allergies.   Review of Systems Review of Systems  Unable to perform ROS: Mental status change     Physical Exam Updated Vital Signs BP 132/67 (BP Location: Right Arm)   Pulse (!) 59   Temp 97.9 F (36.6 C) (Oral)    Resp 16   Wt 76.7 kg (169 lb 1.5 oz)   SpO2 100%   BMI 24.26 kg/m   Physical Exam  HENT:  Head: Normocephalic.  Eyes: Conjunctivae are normal.  Neck: Neck supple.  Cardiovascular: Normal rate and regular rhythm.  No murmur heard. Pulmonary/Chest: Effort normal and breath sounds normal. No respiratory distress.  Abdominal: Soft. There is no tenderness.  Musculoskeletal: He exhibits no edema.  Neurological: A cranial nerve deficit is present. He exhibits abnormal muscle tone. GCS eye subscore is 4. GCS verbal subscore is 2. GCS motor subscore is 5.  Aphasic Right sided facial droop, right hemiplegia Unable to follow commands Leftward deviated gaze (right neglect)  Skin: Skin is warm and dry.  Psychiatric: He has a normal mood and affect.  Nursing note and vitals reviewed.    ED Treatments / Results  Labs (all labs ordered are listed, but only abnormal results are displayed) Labs Reviewed  CBC - Abnormal; Notable for the following components:      Result Value   RBC 4.04 (*)    MCV 100.7 (*)    All other components within normal limits  COMPREHENSIVE METABOLIC PANEL - Abnormal; Notable for the following components:   Glucose, Bld 122 (*)    Total Protein 6.3 (*)    Albumin 2.8 (*)    AST 45 (*)    Alkaline Phosphatase 167 (*)    All other components within normal limits  I-STAT CHEM 8, ED - Abnormal; Notable for the following components:   Chloride 100 (*)    Glucose, Bld 119 (*)    All other components within normal limits  ETHANOL  PROTIME-INR  APTT  DIFFERENTIAL  URINALYSIS, ROUTINE W REFLEX MICROSCOPIC  HEMOGLOBIN A1C  LIPID PANEL  I-STAT TROPONIN, ED    EKG None  Radiology Ct Angio Head W Or Wo Contrast  Result Date: 01/07/2018 CLINICAL DATA:  Code stroke. Right-sided weakness. Atrial fibrillation. Dense left ICA terminus on CT. EXAM: CT ANGIOGRAPHY HEAD AND NECK TECHNIQUE: Multidetector CT imaging of the head and neck was performed using the standard  protocol during bolus administration of intravenous contrast. Multiplanar CT image reconstructions and MIPs were obtained to evaluate the vascular anatomy. Carotid stenosis measurements (when applicable) are obtained utilizing NASCET criteria, using the distal internal carotid diameter as the denominator. CONTRAST:  50mL ISOVUE-370 IOPAMIDOL (ISOVUE-370) INJECTION 76% COMPARISON:  Noncontrast head CT earlier today. No prior angiographic imaging. FINDINGS: CTA NECK FINDINGS Aortic arch: Normal variant aortic arch branching pattern with common origin of the brachiocephalic and left common carotid arteries. Mild arch atherosclerosis. Nonstenotic plaque and both subclavian arteries. Right carotid system: Patent with extensive, predominantly calcified plaque in the carotid bulb resulting in 50% proximal ICA stenosis. Left carotid system: Patent common carotid artery with non stenotic plaque at its origin. Mild nonstenotic plaque at the  carotid bifurcation. Occlusion of the mid cervical ICA. Vertebral arteries: Patent with the right being moderately dominant. At most mild right vertebral artery origin stenosis due to calcified plaque. Skeleton: Advanced cervical facet arthrosis, asymmetrically worse on the right. Cervical disc degeneration, severe at C6-7. Other neck: No mass or enlarged lymph nodes. Upper chest: Mildly enlarged precarinal lymph node measuring 1.3 cm in short axis, indeterminate. Mild motion artifact and mosaic attenuation in the included upper lungs with enlargement of the main pulmonary artery suggesting pulmonary arterial hypertension. Review of the MIP images confirms the above findings CTA HEAD FINDINGS Anterior circulation: The intracranial right ICA is patent with moderate calcified atherosclerosis resulting in mild cavernous and proximal supraclinoid stenosis. The right ACA is patent without evidence of significant proximal stenosis. The right MCA is patent without proximal branch occlusion or  significant M1 stenosis. There is moderate atherosclerotic narrowing of multiple right MCA branch vessels. The intracranial left ICA is completely occluded, as are the left ACA and left MCA origins. The left ACA is reconstituted via the anterior communicating artery with retrograde opacification of the A1 segment. There is relatively poor left MCA reconstitution via collaterals, with reconstitution noted at the level of the bifurcation though with multiple branch vessels being occluded. No aneurysm. Posterior circulation: The intracranial vertebral arteries are widely patent to the basilar. Patent PICA and SCA origins are visualized bilaterally. The basilar artery is patent with mild irregularity but no significant stenosis. Posterior communicating arteries are not identified and may be absent or diminutive. PCAs are patent with mild irregularity but no significant proximal stenosis. No aneurysm. Venous sinuses: As permitted by contrast timing, patent. Anatomic variants: None. Delayed phase: Not performed. Review of the MIP images confirms the above findings IMPRESSION: 1. Left ICA occlusion from its mid cervical segment through the terminus. 2. Poor reconstitution of left MCA branch vessels. 3. Left ACA reconstitution via the anterior communicating artery. 4. 50% proximal right ICA stenosis. 5.  Aortic Atherosclerosis (ICD10-I70.0). These results were communicated to Dr. Wilford Corner at 12:15 pm on 01/07/2018 by text page via the [facility] messaging system. Electronically Signed   By: Sebastian Ache M.D.   On: 01/07/2018 12:34   Ct Angio Neck W Or Wo Contrast  Result Date: 01/07/2018 CLINICAL DATA:  Code stroke. Right-sided weakness. Atrial fibrillation. Dense left ICA terminus on CT. EXAM: CT ANGIOGRAPHY HEAD AND NECK TECHNIQUE: Multidetector CT imaging of the head and neck was performed using the standard protocol during bolus administration of intravenous contrast. Multiplanar CT image reconstructions and MIPs were  obtained to evaluate the vascular anatomy. Carotid stenosis measurements (when applicable) are obtained utilizing NASCET criteria, using the distal internal carotid diameter as the denominator. CONTRAST:  50mL ISOVUE-370 IOPAMIDOL (ISOVUE-370) INJECTION 76% COMPARISON:  Noncontrast head CT earlier today. No prior angiographic imaging. FINDINGS: CTA NECK FINDINGS Aortic arch: Normal variant aortic arch branching pattern with common origin of the brachiocephalic and left common carotid arteries. Mild arch atherosclerosis. Nonstenotic plaque and both subclavian arteries. Right carotid system: Patent with extensive, predominantly calcified plaque in the carotid bulb resulting in 50% proximal ICA stenosis. Left carotid system: Patent common carotid artery with non stenotic plaque at its origin. Mild nonstenotic plaque at the carotid bifurcation. Occlusion of the mid cervical ICA. Vertebral arteries: Patent with the right being moderately dominant. At most mild right vertebral artery origin stenosis due to calcified plaque. Skeleton: Advanced cervical facet arthrosis, asymmetrically worse on the right. Cervical disc degeneration, severe at C6-7. Other neck: No mass or  enlarged lymph nodes. Upper chest: Mildly enlarged precarinal lymph node measuring 1.3 cm in short axis, indeterminate. Mild motion artifact and mosaic attenuation in the included upper lungs with enlargement of the main pulmonary artery suggesting pulmonary arterial hypertension. Review of the MIP images confirms the above findings CTA HEAD FINDINGS Anterior circulation: The intracranial right ICA is patent with moderate calcified atherosclerosis resulting in mild cavernous and proximal supraclinoid stenosis. The right ACA is patent without evidence of significant proximal stenosis. The right MCA is patent without proximal branch occlusion or significant M1 stenosis. There is moderate atherosclerotic narrowing of multiple right MCA branch vessels. The  intracranial left ICA is completely occluded, as are the left ACA and left MCA origins. The left ACA is reconstituted via the anterior communicating artery with retrograde opacification of the A1 segment. There is relatively poor left MCA reconstitution via collaterals, with reconstitution noted at the level of the bifurcation though with multiple branch vessels being occluded. No aneurysm. Posterior circulation: The intracranial vertebral arteries are widely patent to the basilar. Patent PICA and SCA origins are visualized bilaterally. The basilar artery is patent with mild irregularity but no significant stenosis. Posterior communicating arteries are not identified and may be absent or diminutive. PCAs are patent with mild irregularity but no significant proximal stenosis. No aneurysm. Venous sinuses: As permitted by contrast timing, patent. Anatomic variants: None. Delayed phase: Not performed. Review of the MIP images confirms the above findings IMPRESSION: 1. Left ICA occlusion from its mid cervical segment through the terminus. 2. Poor reconstitution of left MCA branch vessels. 3. Left ACA reconstitution via the anterior communicating artery. 4. 50% proximal right ICA stenosis. 5.  Aortic Atherosclerosis (ICD10-I70.0). These results were communicated to Dr. Wilford Corner at 12:15 pm on 01/07/2018 by text page via the [facility] messaging system. Electronically Signed   By: Sebastian Ache M.D.   On: 01/07/2018 12:34   Ct Head Code Stroke Wo Contrast  Result Date: 01/07/2018 CLINICAL DATA:  Code stroke. Right-sided weakness. Atrial fibrillation off anticoagulation. Recent pontine bleed. EXAM: CT HEAD WITHOUT CONTRAST TECHNIQUE: Contiguous axial images were obtained from the base of the skull through the vertex without intravenous contrast. COMPARISON:  None. FINDINGS: Brain: Moderate atrophy. Moderate chronic microvascular ischemic changes in the white matter. Chronic lacunar infarction in the left corona radiata is  unchanged. Hypodensity left pons compatible with prior hemorrhage. Negative for acute hemorrhage. Negative for acute infarct or mass. No midline shift. Vascular: Asymmetric hyperdensity of the left MCA with abrupt cutoff suggesting MCA thrombus. Skull: Negative Sinuses/Orbits: Negative Other: None ASPECTS (Alberta Stroke Program Early CT Score) - Ganglionic level infarction (caudate, lentiform nuclei, internal capsule, insula, M1-M3 cortex): 7 - Supraganglionic infarction (M4-M6 cortex): 3 Total score (0-10 with 10 being normal): 10 IMPRESSION: 1. Negative for acute infarct. Findings are very suspicious for acute left MCA thrombosis. 2. ASPECTS is 10 3. Atrophy and chronic microvascular ischemic changes. Negative for acute hemorrhage. 4. These results were discussed directly at the time of interpretation on 01/07/2018 at 11:56 am to Dr. Wilford Corner, who verbally acknowledged these results. Electronically Signed   By: Marlan Palau M.D.   On: 01/07/2018 11:57    Procedures Procedures (including critical care time)  Medications Ordered in ED Medications   stroke: mapping our early stages of recovery book (has no administration in time range)  0.9 %  sodium chloride infusion (has no administration in time range)  acetaminophen (TYLENOL) tablet 650 mg (has no administration in time range)    Or  acetaminophen (TYLENOL) solution 650 mg (has no administration in time range)    Or  acetaminophen (TYLENOL) suppository 650 mg (has no administration in time range)  iopamidol (ISOVUE-370) 76 % injection (50 mLs  Contrast Given 01/07/18 1200)     Initial Impression / Assessment and Plan / ED Course  I have reviewed the triage vital signs and the nursing notes.  Pertinent labs & imaging results that were available during my care of the patient were reviewed by me and considered in my medical decision making (see chart for details).     Patient is a 84 year old male with history of atrial fibrillation not  currently on anticoagulation due to left pontine ICH in March 2019 who presents with acute onset of right-sided hemiplegia, facial droop, and aphasia.  Exam as above.  Neurology saw patient on arrival to the ED.  Noncontrast CT of the head showed left MCA stroke.  He was not felt to be a candidate for TPA or further intervention with interventionalist. CODE STATUS confirmed by family members, DNR/DNI. Discussed with hospitalist for admission to stepdown unit.  Patient and plan of care discussed with Attending physician, Dr. Denton Lank.    Final Clinical Impressions(s) / ED Diagnoses   Final diagnoses:  None    ED Discharge Orders    None       Wynelle Cleveland, MD 01/07/18 1711    Cathren Laine, MD 01/08/18 [phone]

## 2018-01-07 NOTE — ED Notes (Signed)
Family at bedside. Family is being updated by neurologist.  Pt is alert and responds to verbal stimuli.

## 2018-01-07 NOTE — ED Notes (Signed)
Family was updated that pt will be transferred to 3W

## 2018-01-07 NOTE — Progress Notes (Addendum)
[name] is a 84 y.o. male presenting to ED via [facility] from [facility] and rehab with acute onset of Right side weakness, Right side facial droop, Left side gaze and non verbal. Pt LKW 1050 at which time he was in the process of his PT session when his symptoms developed. NIH 24, LKW 1050, CBG 126 Pt to be admitted to triad for further evaluation, no acute treatment

## 2018-01-07 NOTE — ED Triage Notes (Signed)
Pt arrives by [facility] from [facility] and rehab where he was recovering from recent hemorrhagic stroke.  Pt was having PT when he had a sudden onset of being non verbal with L gaze and right sided weakness and right facial droop which began at 10:50.

## 2018-01-08 DIAGNOSIS — Z515 Encounter for palliative care: Secondary | ICD-10-CM

## 2018-01-08 DIAGNOSIS — R1312 Dysphagia, oropharyngeal phase: Secondary | ICD-10-CM

## 2018-01-08 DIAGNOSIS — R4701 Aphasia: Secondary | ICD-10-CM

## 2018-01-08 DIAGNOSIS — H538 Other visual disturbances: Secondary | ICD-10-CM

## 2018-01-08 DIAGNOSIS — Z79899 Other long term (current) drug therapy: Secondary | ICD-10-CM

## 2018-01-08 DIAGNOSIS — I639 Cerebral infarction, unspecified: Secondary | ICD-10-CM

## 2018-01-08 DIAGNOSIS — I6300 Cerebral infarction due to thrombosis of unspecified precerebral artery: Secondary | ICD-10-CM

## 2018-01-08 DIAGNOSIS — I63512 Cerebral infarction due to unspecified occlusion or stenosis of left middle cerebral artery: Secondary | ICD-10-CM

## 2018-01-08 DIAGNOSIS — Z66 Do not resuscitate: Secondary | ICD-10-CM

## 2018-01-08 DIAGNOSIS — Z8673 Personal history of transient ischemic attack (TIA), and cerebral infarction without residual deficits: Secondary | ICD-10-CM

## 2018-01-08 DIAGNOSIS — G8101 Flaccid hemiplegia affecting right dominant side: Secondary | ICD-10-CM

## 2018-01-08 DIAGNOSIS — E785 Hyperlipidemia, unspecified: Secondary | ICD-10-CM

## 2018-01-08 DIAGNOSIS — I4891 Unspecified atrial fibrillation: Secondary | ICD-10-CM

## 2018-01-08 DIAGNOSIS — Z87820 Personal history of traumatic brain injury: Secondary | ICD-10-CM

## 2018-01-08 DIAGNOSIS — I10 Essential (primary) hypertension: Secondary | ICD-10-CM

## 2018-01-08 DIAGNOSIS — R063 Periodic breathing: Secondary | ICD-10-CM

## 2018-01-08 LAB — LIPID PANEL
CHOL/HDL RATIO: 4.2 ratio
Cholesterol: 162 mg/dL (ref 0–200)
HDL: 39 mg/dL — AB (ref >40)
LDL CALC: 108 mg/dL — AB (ref 0–99)
TRIGLYCERIDES: 73 mg/dL (ref <150)
VLDL: 15 mg/dL (ref 0–40)

## 2018-01-08 LAB — HEMOGLOBIN A1C
HEMOGLOBIN A1C: 5.6 % (ref 4.8–5.6)
Mean Plasma Glucose: 114.02 mg/dL

## 2018-01-08 MED ORDER — MORPHINE SULFATE 10 MG/5ML PO SOLN
5.0000 mg | ORAL | Status: DC | PRN
  Administered 2018-01-08 (×3): 5 mg via ORAL
  Filled 2018-01-08 (×4): qty 4

## 2018-01-08 MED ORDER — HALOPERIDOL LACTATE 5 MG/ML IJ SOLN
1.0000 mg | Freq: Once | INTRAMUSCULAR | Status: AC
  Administered 2018-01-08: 1 mg via INTRAVENOUS
  Filled 2018-01-08: qty 1

## 2018-01-08 MED ORDER — ASPIRIN 300 MG RE SUPP
300.0000 mg | Freq: Every day | RECTAL | Status: DC
  Administered 2018-01-08: 300 mg via RECTAL
  Filled 2018-01-08: qty 1

## 2018-01-08 MED ORDER — LORAZEPAM 1 MG PO TABS
1.0000 mg | ORAL_TABLET | Freq: Four times a day (QID) | ORAL | Status: DC | PRN
  Administered 2018-01-10: 1 mg via ORAL
  Filled 2018-01-08 (×2): qty 1

## 2018-01-08 NOTE — Care Management Note (Signed)
Case Management Note  Patient Details  Name: [name] MRN: [id number] Date of Birth: [birth year]  Subjective/Objective:     Pt admitted with CVA. He was at [facility] for rehab.               Action/Plan: Palliative care consulted. CSW aware and notified of potential [facility] referral. CM following.  Expected Discharge Date:                  Expected Discharge Plan:  Hospice Medical Facility  In-House Referral:  Clinical Social Work  Discharge planning Services     Post Acute Care Choice:    Choice offered to:     DME Arranged:    DME Agency:     HH Arranged:    HH Agency:     Status of Service:  In process, will continue to follow  If discussed at Long Length of Stay Meetings, dates discussed:    Additional Comments:  Kermit Balo, RN 01/08/2018, 11:48 AM

## 2018-01-08 NOTE — Evaluation (Signed)
Clinical/Bedside Swallow Evaluation Patient Details  Name: [name] MRN: [id number] Date of Birth: 10-25-1929  Today's Date: 01/08/2018 Time: SLP Start Time (ACUTE ONLY): 0850 SLP Stop Time (ACUTE ONLY): 0900 SLP Time Calculation (min) (ACUTE ONLY): 10 min  Past Medical History:  Past Medical History:  Diagnosis Date  . Atrial fibrillation (HCC)   . Atrial flutter (HCC)   . Atrial flutter (HCC)   . Edema 03/17/2009   Qualifier: Diagnosis of  By: Flonnie Overman    . GERD (gastroesophageal reflux disease)   . HTN (hypertension)   . Hypercholesteremia   . Hyperlipidemia   . Stroke [facility])    Past Surgical History: History reviewed. No pertinent surgical history. HPI:  [name] is an 84yo male with PMH of atrial fibrillation (not on anticoagulation due to left pontine bleed in 10/2017), HTN, hyperlipidemia, and hx of CVA who presents with right sided weakness and facial droop. Daughter and son-in-law at bedside who provide additional history. Patient nonverbal and with AMS, limiting ability to obtain history from patient. He was admitted 3/2-3/13/2019 for left pontine intracerebral hemorrhage, which presented with right sided weakness, right sided facial droop, dysarthria, and swallowing issues. At the time of discharge, he had persistent right sided weakness and swallowing issues. He was discharged to SNF for acute rehab with the eventual plan to return home. At the facility, daughter reports that he had been making some improvements in his weakness and was tolerating a mechanical soft diet. They had planned to do a home visit today to hopefully return home soon. Daughter reports that she saw the patient this morning at around 8:30am and he was at his baseline, able to feed himself breakfast and converse normally. He had no complaints when his daughter saw him. He was getting ready to work with PT a little before noon today when he was noted to be weak on the right sided, had right facial  droop, and was nonverbal. Upon my evaluation, patient is nonverbal and does not respond to questions. At baseline, patient is alert and oriented. Prior to his admission in March, he was independently living at home. No smoking or alcohol use. Patient does not have a PCP, but sees a cardiologist and neurologist. MRI revealed left ICA occlusion with extensive acute infarct in the left MCA distribution, small acute infarct in the right frontal white matter, 1 cm subacute hematoma in the left pons as well as atrophy and chronic small vessel ischemia.   Assessment / Plan / Recommendation Clinical Impression  Pt has history of moderate oropharyngeal dysphagia and high risk of aspiration with concern that pt would not be able to sustain nutrition at discharge in March 2019 d/t dysphagia and mentation. He was discharged to SNF on puree with nectar thick liquids and minimal consumption with Palliative care consult sought in previous admission for GOC. During this Bedside Swallow Evaluation, pt with little oral awareness of ice chips, thin liquids via spoon or nectar thick liquids. Despite Max A cues, pt with appearance of delayed swallow initiation, multiple swallow and increased work of breathing (markedly different breathing pattern across all trials). Pt continues to remain at very high level of aspiration. Given history of dysphagia and clinical concern for aspiration and nutritional status and further declining cognitive linguistic deficits this session, recommend pt remain NPo with Palliative Care/Hopsice Consult requested to establish goals of care. ST to follow acutely.  SLP Visit Diagnosis: Dysphagia, oropharyngeal phase (R13.12)    Aspiration Risk  Severe aspiration risk;Risk  for inadequate nutrition/hydration    Diet Recommendation NPO   Medication Administration: Via alternative means    Other  Recommendations Oral Care Recommendations: Oral care QID   Follow up Recommendations (Palliative care  consult/Hospice consult)      Frequency and Duration min 2x/week  2 weeks       Prognosis Prognosis for Safe Diet Advancement: Guarded Barriers to Reach Goals: Cognitive deficits;Severity of deficits      Swallow Study   General Date of Onset: 01/08/18 HPI: [name] is an 84yo male with PMH of atrial fibrillation (not on anticoagulation due to left pontine bleed in 10/2017), HTN, hyperlipidemia, and hx of CVA who presents with right sided weakness and facial droop. Daughter and son-in-law at bedside who provide additional history. Patient nonverbal and with AMS, limiting ability to obtain history from patient. He was admitted 3/2-3/13/2019 for left pontine intracerebral hemorrhage, which presented with right sided weakness, right sided facial droop, dysarthria, and swallowing issues. At the time of discharge, he had persistent right sided weakness and swallowing issues. He was discharged to SNF for acute rehab with the eventual plan to return home. At the facility, daughter reports that he had been making some improvements in his weakness and was tolerating a mechanical soft diet. They had planned to do a home visit today to hopefully return home soon. Daughter reports that she saw the patient this morning at around 8:30am and he was at his baseline, able to feed himself breakfast and converse normally. He had no complaints when his daughter saw him. He was getting ready to work with PT a little before noon today when he was noted to be weak on the right sided, had right facial droop, and was nonverbal. Upon my evaluation, patient is nonverbal and does not respond to questions. At baseline, patient is alert and oriented. Prior to his admission in March, he was independently living at home. No smoking or alcohol use. Patient does not have a PCP, but sees a cardiologist and neurologist. MRI revealed left ICA occlusion with extensive acute infarct in the left MCA distribution, small acute infarct in  the right frontal white matter, 1 cm subacute hematoma in the left pons as well as atrophy and chronic small vessel ischemia. Type of Study: Bedside Swallow Evaluation Previous Swallow Assessment: 10/2017 - discharged in puree and nectar Diet Prior to this Study: NPO Temperature Spikes Noted: No Respiratory Status: Room air History of Recent Intubation: No Behavior/Cognition: Lethargic/Drowsy;Distractible;Requires cueing;Doesn't follow directions;Confused Oral Cavity Assessment: Within Functional Limits Oral Care Completed by SLP: Yes Patient Positioning: Upright in bed Baseline Vocal Quality: (nonverbal) Volitional Cough: Cognitively unable to elicit Volitional Swallow: Unable to elicit    Oral/Motor/Sensory Function Overall Oral Motor/Sensory Function: (unable to assess d/t cognition)   Ice Chips Ice chips: Impaired Presentation: Spoon Oral Phase Impairments: Reduced labial seal;Poor awareness of bolus;Reduced lingual movement/coordination Oral Phase Functional Implications: Oral holding;Prolonged oral transit Pharyngeal Phase Impairments: Suspected delayed Swallow;Change in Vital Signs   Thin Liquid Thin Liquid: Impaired Presentation: Cup Oral Phase Impairments: Reduced labial seal;Reduced lingual movement/coordination;Poor awareness of bolus Oral Phase Functional Implications: Prolonged oral transit;Oral holding Pharyngeal  Phase Impairments: Suspected delayed Swallow;Multiple swallows;Change in Vital Signs    Nectar Thick Nectar Thick Liquid: Impaired Presentation: Spoon Oral Phase Impairments: Reduced labial seal;Reduced lingual movement/coordination;Poor awareness of bolus Oral phase functional implications: Oral holding;Prolonged oral transit Pharyngeal Phase Impairments: Suspected delayed Swallow;Multiple swallows;Change in Vital Signs   Honey Thick Honey Thick Liquid: Not tested  Puree Puree: Not tested   Solid   GO   Solid: Not tested        Jeryn Bertoni 01/08/2018,1:28 PM

## 2018-01-08 NOTE — Consult Note (Signed)
Consultation Note Date: 01/08/2018   Patient Name: [name]  DOB: [birth year]  MRN: [id number]  Age / Sex: 84 y.o., male  PCP: Patient, No Pcp Per Referring Physician: Doneen Poisson, MD  Reason for Consultation: Establishing goals of care and Psychosocial/spiritual support  HPI/Patient Profile: 84 y.o. male  admitted on 01/07/2018 with a PMH of atrial fibrillation was on anticoagulation till March 2019 when he had a left pontine bleed and his anticoagulation was discontinued, who was recovering from his left pontine bleed of March in a skilled nursing facility, was brought in for evaluation of sudden onset of right-sided flaccid paralysis, left gaze preference and aphasia.  According to history provided by EMS, patient was with physical therapy participating in therapy when he had a sudden onset of weakness of the right side.  His right arm become flaccid.  His right leg also became weak.  He started looking to the left and had difficulty bringing words out.  He was not following any commands.There is no seizure activity noted.  He was taken for a stat noncontrast CT of the head which showed possible hyperdense MCA on the left with chronic white matter changes, this was followed by a CT angiogram of the head that showed a left M1 partial occlusion with recanalization distally  IMPRESSION: 1. Left ICA occlusion with extensive acute infarct in the left MCA distribution. Small acute infarct in the right frontal white matter. 2. 1 cm subacute hematoma in the left pons. 3. Atrophy and chronic small vessel ischemia  Family face treatment option decisions, advanced directive decisions and anticipatory care needs.  Clinical Assessment and Goals of Care:  This NP Lorinda Creed reviewed medical records, received report from team, assessed the patient and then meet at the patient's bedside along with his wife,  daughter and son -in law  to discuss diagnosis, prognosis, GOC, EOL wishes disposition and options.  Concept of Hospice and Palliative Care were discussed  A detailed discussion was had today regarding advanced directives.  Concepts specific to code status, artifical feeding and hydration, continued IV antibiotics and rehospitalization was had.  The difference between a aggressive medical intervention path  and a palliative comfort care path for this patient at this time was had.  Values and goals of care important to patient and family were attempted to be elicited.  MOST form introduced  Natural trajectory and expectations at EOL were discussed.  Questions and concerns addressed.   Family encouraged to call with questions or concerns.    PMT will continue to support holistically  HCPOA    SUMMARY OF RECOMMENDATIONS    Code Status/Advance Care Planning:  DNR   Symptom Management:   Pain/Dyspnea: Roxanol 5 mg po/sl every 1 hr prn  Agitation: Ativan 1 mg po/sl every 6 hrs prn  Dysphagia: sips and chips as tolerated for comfort, with known risk of aspiration-discussed with family  Palliative Prophylaxis:   Aspiration, Bowel Regimen, Delirium Protocol, Frequent Pain Assessment and Oral Care  Additional Recommendations (Limitations, Scope, Preferences):  Full Comfort Care  Psycho-social/Spiritual:   Desire for further Chaplaincy support:yes  Additional Recommendations: Education on Hospice  Prognosis:   Will evaluate in am for residential hospice  Discharge Planning: To Be Determined      Primary Diagnoses: Present on Admission: . Stroke [facility])   I have reviewed the medical record, interviewed the patient and family, and examined the patient. The following aspects are pertinent.  Past Medical History:  Diagnosis Date  . Atrial fibrillation (HCC)   . Atrial flutter (HCC)   . Atrial flutter (HCC)   . Edema 03/17/2009   Qualifier: Diagnosis of  By: Flonnie Overman    . GERD (gastroesophageal reflux disease)   . HTN (hypertension)   . Hypercholesteremia   . Hyperlipidemia   . Stroke [facility])    Social History   Socioeconomic History  . Marital status: Married    Spouse name: Not on file  . Number of children: Not on file  . Years of education: Not on file  . Highest education level: Not on file  Occupational History  . Not on file  Social Needs  . Financial resource strain: Not on file  . Food insecurity:    Worry: Not on file    Inability: Not on file  . Transportation needs:    Medical: Not on file    Non-medical: Not on file  Tobacco Use  . Smoking status: Never Smoker  . Smokeless tobacco: Never Used  Substance and Sexual Activity  . Alcohol use: No    Alcohol/week: 0.0 oz  . Drug use: No  . Sexual activity: Yes  Lifestyle  . Physical activity:    Days per week: Not on file    Minutes per session: Not on file  . Stress: Not on file  Relationships  . Social connections:    Talks on phone: Not on file    Gets together: Not on file    Attends religious service: Not on file    Active member of club or organization: Not on file    Attends meetings of clubs or organizations: Not on file    Relationship status: Not on file  Other Topics Concern  . Not on file  Social History Narrative  . Not on file   Family History  Problem Relation Age of Onset  . Healthy Mother   . Other Father        "too much iron in blood"  . Healthy Sister    Scheduled Meds: . aspirin  300 mg Rectal Daily  . haloperidol lactate  1 mg Intravenous Once   Continuous Infusions: . sodium chloride 50 mL/hr at 01/08/18 0300   PRN Meds:.acetaminophen **OR** acetaminophen (TYLENOL) oral liquid 160 mg/5 mL **OR** acetaminophen, metoprolol tartrate Medications Prior to Admission:  Prior to Admission medications   Medication Sig Start Date End Date Taking? Authorizing Provider  atorvastatin (LIPITOR) 20 MG tablet Take 3 tablets (60 mg total) by  mouth daily at 6 PM. 11/16/17  Yes Drema Dallas, MD  Calcium Carbonate-Vitamin D (CALCIUM 600+D3 PO) Take 1 tablet by mouth daily.    Yes [provider]  Cholecalciferol (VITAMIN D-3) 1000 units CAPS Take 1,000 Units by mouth daily.   Yes [provider]  diltiazem (CARDIZEM) 90 MG tablet Take 1 tablet (90 mg total) by mouth every 8 (eight) hours. 11/16/17  Yes Drema Dallas, MD  metoprolol tartrate (LOPRESSOR) 50 MG tablet Take 1 tablet (50 mg total) by mouth 2 (two)  times daily. 11/16/17  Yes Drema Dallas, MD  Multiple Vitamins-Minerals (MULTIVITAMIN WITH MINERALS) tablet Take 1 tablet by mouth daily.   Yes [provider]  pantoprazole (PROTONIX) 40 MG tablet Take 1 tablet (40 mg total) by mouth at bedtime. 11/07/17  Yes Layne Benton, NP  polyethylene glycol (MIRALAX / GLYCOLAX) packet Take 17 g by mouth daily.   Yes [provider]  QUEtiapine (SEROQUEL) 25 MG tablet Take 0.5 tablets (12.5 mg total) by mouth 2 (two) times daily. 11/07/17  Yes Layne Benton, NP  docusate sodium (COLACE) 100 MG capsule Take 1 capsule (100 mg total) by mouth 2 (two) times daily. Patient not taking: Reported on 01/07/2018 11/07/17 11/07/18  Layne Benton, NP   No Known Allergies Review of Systems  Unable to perform ROS   Physical Exam  Constitutional: He appears lethargic. He appears cachectic. He appears ill.  Cardiovascular: Tachycardia present.  Pulmonary/Chest: He has decreased breath sounds.  Neurological: He appears lethargic.  - non verbal, unable to follow commands  Skin: Skin is warm and dry.    Vital Signs: BP 135/83 (BP Location: Right Arm)   Pulse (!) 112   Temp 99 F (37.2 C) (Axillary)   Resp 20   Wt 76.7 kg (169 lb 1.5 oz)   SpO2 97%   BMI 24.26 kg/m  Pain Scale: Faces   Pain Score: 3    SpO2: SpO2: 97 % O2 Device:SpO2: 97 % O2 Flow Rate: .   IO: Intake/output summary:   Intake/Output Summary (Last 24 hours) at 01/08/2018 1421 Last  data filed at 01/08/2018 0300 Gross per 24 hour  Intake 305 ml  Output 1400 ml  Net -1095 ml    LBM: Last BM Date: 01/06/18 Baseline Weight: Weight: 76.7 kg (169 lb 1.5 oz) Most recent weight: Weight: 76.7 kg (169 lb 1.5 oz)     Palliative Assessment/Data: 20%   Discussed with Dr Renaldo Reel  Time In: 1345 Time Out: 1500 Time Total: 75 minutes Greater than 50%  of this time was spent counseling and coordinating care related to the above assessment and plan.  Signed by: Lorinda Creed, NP   Please contact Palliative Medicine Team phone at [phone] for questions and concerns.  For individual provider: See Loretha Stapler

## 2018-01-08 NOTE — Evaluation (Signed)
Physical Therapy Evaluation Patient Details Name: [name] MRN: [id number] DOB: 03-Oct-1929 Today's Date: 01/08/2018   History of Present Illness  84yo male with PMH of atrial fibrillation (not on anticoagulation due to left pontine bleed in 10/2017), HTN, hyperlipidemia, and hx of CVA who presents with right sided weakness and facial droop. Imaging revealed large left MCA CVA.  Clinical Impression  Orders received for PT evaluation. Patient demonstrates deficits in functional mobility as indicated below. Will benefit from continued skilled PT to address deficits and maximize function. Will see as indicated and progress as tolerated.  At this time, patient requiring significant physical assist (max to total of 2 persons) at all times. No active movement noted in right side, and patient non verbal throughout session. Will recommend return to SNF for further rehabiltation.    Follow Up Recommendations SNF;Supervision/Assistance - 24 hour    Equipment Recommendations  (hoyer lift, hospital bed)    Recommendations for Other Services       Precautions / Restrictions Precautions Precautions: Fall      Mobility  Bed Mobility Overal bed mobility: Needs Assistance Bed Mobility: Rolling;Sidelying to Sit;Sit to Supine Rolling: Max assist;+2 for physical assistance Sidelying to sit: +2 for physical assistance;Total assist   Sit to supine: +2 for physical assistance;Total assist   General bed mobility comments: Patient max assist for rolling to the right side, able to use LUE to hold rail. Assist to move LEs to EOB and elevate trunk to upright. Hard right lean (left pushing). Total assist to return to supine and reposition in bed  Transfers                 General transfer comment: unable to attempt  Ambulation/Gait                Stairs            Wheelchair Mobility    Modified Rankin (Stroke Patients Only) Modified Rankin (Stroke Patients  Only) Pre-Morbid Rankin Score: No symptoms Modified Rankin: Severe disability     Balance Overall balance assessment: Needs assistance Sitting-balance support: Feet supported Sitting balance-Leahy Scale: Poor Sitting balance - Comments: hard right lean (left push), max to total assist to maintain sitting balance Postural control: Right lateral lean;Posterior lean                                   Pertinent Vitals/Pain Pain Assessment: Faces Faces Pain Scale: Hurts little more Pain Location: right UE Pain Descriptors / Indicators: Grimacing Pain Intervention(s): Monitored during session    Home Living Family/patient expects to be discharged to:: Skilled nursing facility                      Prior Function Level of Independence: Needs assistance   Gait / Transfers Assistance Needed: was non ambulatory, and moderate assist for transfers. began attempting steps in parallell bars  ADL's / Homemaking Assistance Needed: assist from staff and family, Reports that patient was able to feed himself with set up at times        Hand Dominance   Dominant Hand: Right    Extremity/Trunk Assessment   Upper Extremity Assessment Upper Extremity Assessment: RUE deficits/detail;Difficult to assess due to impaired cognition RUE Deficits / Details: not active movement note, increased edema  RUE Coordination: (absent)    Lower Extremity Assessment Lower Extremity Assessment: RLE deficits/detail;LLE deficits/detail RLE Deficits / Details:  no active movement noted in RLE, some restrictions of PROM at endrange RLE Coordination: (absent) LLE Deficits / Details: limited active range, strength grossly 2+/5  LLE Coordination: decreased fine motor;decreased gross motor       Communication   Communication: Expressive difficulties  Cognition Arousal/Alertness: Awake/alert Behavior During Therapy: Flat affect Overall Cognitive Status: Difficult to assess Area of  Impairment: Orientation;Attention;Following commands;Safety/judgement;Awareness;Problem solving                   Current Attention Level: Focused   Following Commands: Follows one step commands inconsistently;Follows one step commands with increased time   Awareness: Intellectual Problem Solving: Slow processing;Decreased initiation;Difficulty sequencing;Requires verbal cues;Requires tactile cues        General Comments      Exercises     Assessment/Plan    PT Assessment Patient needs continued PT services  PT Problem List Decreased strength;Decreased activity tolerance;Decreased range of motion;Decreased mobility;Decreased balance;Decreased coordination;Decreased cognition;Decreased knowledge of use of DME;Decreased safety awareness       PT Treatment Interventions DME instruction;Gait training;Functional mobility training;Therapeutic activities;Therapeutic exercise;Balance training;Neuromuscular re-education;Cognitive remediation;Patient/family education    PT Goals (Current goals can be found in the Care Plan section)  Acute Rehab PT Goals Patient Stated Goal: none stated PT Goal Formulation: With family Time For Goal Achievement: 01/22/18 Potential to Achieve Goals: Fair    Frequency Min 2X/week   Barriers to discharge        Co-evaluation PT/OT/SLP Co-Evaluation/Treatment: Yes Reason for Co-Treatment: Complexity of the patient's impairments (multi-system involvement);Necessary to address cognition/behavior during functional activity;For patient/therapist safety;To address functional/ADL transfers PT goals addressed during session: Mobility/safety with mobility;Balance OT goals addressed during session: ADL's and self-care       AM-PAC PT "6 Clicks" Daily Activity  Outcome Measure Difficulty turning over in bed (including adjusting bedclothes, sheets and blankets)?: Unable Difficulty moving from lying on back to sitting on the side of the bed? :  Unable Difficulty sitting down on and standing up from a chair with arms (e.g., wheelchair, bedside commode, etc,.)?: Unable Help needed moving to and from a bed to chair (including a wheelchair)?: Total Help needed walking in hospital room?: Total Help needed climbing 3-5 steps with a railing? : Total 6 Click Score: 6    End of Session   Activity Tolerance: Patient limited by fatigue Patient left: in bed;with call bell/phone within reach;with bed alarm set;with family/visitor present Nurse Communication: Mobility status PT Visit Diagnosis: Hemiplegia and hemiparesis Hemiplegia - Right/Left: Right Hemiplegia - dominant/non-dominant: Dominant Hemiplegia - caused by: Cerebral infarction    Time: 1610-9604 PT Time Calculation (min) (ACUTE ONLY): 23 min   Charges:   PT Evaluation $PT Eval Moderate Complexity: 1 Mod     PT G Codes:        Charlotte Crumb, PT DPT  Board Certified Neurologic Specialist [phone]   Fabio Asa 01/08/2018, 10:36 AM

## 2018-01-08 NOTE — Progress Notes (Signed)
Patient was in MRI at time of 1815 required 2 hour neuro check. Reassessment performed at time of return 1830. [facility]

## 2018-01-08 NOTE — Progress Notes (Addendum)
STROKE TEAM PROGRESS NOTE   INTERVAL HISTORY His Daughter and family members are at the bedside. Daughter shared recent history, of his progression in the skilled nursing facility since I saw him with his last stroke.  Very disheartened and saddened that he is worse.  Patient already a DNR.  Discussed lasting effects of stroke.  She does not think he would want permanent PEG tube placement.  Will discuss further with primary team.  Vitals:   01/08/18 0100 01/08/18 0300 01/08/18 0517 01/08/18 0820  BP: 99/71 (!) 114/59 (!) 122/93 (!) 129/98  Pulse: (!) 56  73 (!) 122  Resp:   18 20  Temp:   98.5 F (36.9 C) 98.1 F (36.7 C)  TempSrc:   Oral Oral  SpO2: 90% 97% 92% 95%  Weight:        CBC:  Recent Labs  Lab 01/07/18 1143 01/07/18 1152  WBC 10.4  --   NEUTROABS 5.9  --   HGB 13.7 13.6  HCT 40.7 40.0  MCV 100.7*  --   PLT 278  --     Basic Metabolic Panel:  Recent Labs  Lab 01/07/18 1143 01/07/18 1152  NA 137 139  K 3.8 3.7  CL 103 100*  CO2 27  --   GLUCOSE 122* 119*  BUN 13 14  CREATININE 1.06 0.90  CALCIUM 9.0  --    Lipid Panel:     Component Value Date/Time   CHOL 162 01/08/2018 0600   TRIG 73 01/08/2018 0600   HDL 39 (L) 01/08/2018 0600   CHOLHDL 4.2 01/08/2018 0600   VLDL 15 01/08/2018 0600   LDLCALC 108 (H) 01/08/2018 0600   HgbA1c:  Lab Results  Component Value Date   HGBA1C 5.6 01/08/2018   Urine Drug Screen: No results found for: LABOPIA, COCAINSCRNUR, LABBENZ, AMPHETMU, THCU, LABBARB  Alcohol Level     Component Value Date/Time   ETH <10 01/07/2018 1143    IMAGING Ct Angio Head W Or Wo Contrast  Result Date: 01/07/2018 CLINICAL DATA:  Code stroke. Right-sided weakness. Atrial fibrillation. Dense left ICA terminus on CT. EXAM: CT ANGIOGRAPHY HEAD AND NECK TECHNIQUE: Multidetector CT imaging of the head and neck was performed using the standard protocol during bolus administration of intravenous contrast. Multiplanar CT image reconstructions  and MIPs were obtained to evaluate the vascular anatomy. Carotid stenosis measurements (when applicable) are obtained utilizing NASCET criteria, using the distal internal carotid diameter as the denominator. CONTRAST:  50mL ISOVUE-370 IOPAMIDOL (ISOVUE-370) INJECTION 76% COMPARISON:  Noncontrast head CT earlier today. No prior angiographic imaging. FINDINGS: CTA NECK FINDINGS Aortic arch: Normal variant aortic arch branching pattern with common origin of the brachiocephalic and left common carotid arteries. Mild arch atherosclerosis. Nonstenotic plaque and both subclavian arteries. Right carotid system: Patent with extensive, predominantly calcified plaque in the carotid bulb resulting in 50% proximal ICA stenosis. Left carotid system: Patent common carotid artery with non stenotic plaque at its origin. Mild nonstenotic plaque at the carotid bifurcation. Occlusion of the mid cervical ICA. Vertebral arteries: Patent with the right being moderately dominant. At most mild right vertebral artery origin stenosis due to calcified plaque. Skeleton: Advanced cervical facet arthrosis, asymmetrically worse on the right. Cervical disc degeneration, severe at C6-7. Other neck: No mass or enlarged lymph nodes. Upper chest: Mildly enlarged precarinal lymph node measuring 1.3 cm in short axis, indeterminate. Mild motion artifact and mosaic attenuation in the included upper lungs with enlargement of the main pulmonary artery suggesting pulmonary arterial hypertension.  Review of the MIP images confirms the above findings CTA HEAD FINDINGS Anterior circulation: The intracranial right ICA is patent with moderate calcified atherosclerosis resulting in mild cavernous and proximal supraclinoid stenosis. The right ACA is patent without evidence of significant proximal stenosis. The right MCA is patent without proximal branch occlusion or significant M1 stenosis. There is moderate atherosclerotic narrowing of multiple right MCA branch  vessels. The intracranial left ICA is completely occluded, as are the left ACA and left MCA origins. The left ACA is reconstituted via the anterior communicating artery with retrograde opacification of the A1 segment. There is relatively poor left MCA reconstitution via collaterals, with reconstitution noted at the level of the bifurcation though with multiple branch vessels being occluded. No aneurysm. Posterior circulation: The intracranial vertebral arteries are widely patent to the basilar. Patent PICA and SCA origins are visualized bilaterally. The basilar artery is patent with mild irregularity but no significant stenosis. Posterior communicating arteries are not identified and may be absent or diminutive. PCAs are patent with mild irregularity but no significant proximal stenosis. No aneurysm. Venous sinuses: As permitted by contrast timing, patent. Anatomic variants: None. Delayed phase: Not performed. Review of the MIP images confirms the above findings IMPRESSION: 1. Left ICA occlusion from its mid cervical segment through the terminus. 2. Poor reconstitution of left MCA branch vessels. 3. Left ACA reconstitution via the anterior communicating artery. 4. 50% proximal right ICA stenosis. 5.  Aortic Atherosclerosis (ICD10-I70.0). These results were communicated to Dr. Wilford Corner at 12:15 pm on 01/07/2018 by text page via the [facility] messaging system. Electronically Signed   By: Sebastian Ache M.D.   On: 01/07/2018 12:34   Ct Angio Neck W Or Wo Contrast  Result Date: 01/07/2018 CLINICAL DATA:  Code stroke. Right-sided weakness. Atrial fibrillation. Dense left ICA terminus on CT. EXAM: CT ANGIOGRAPHY HEAD AND NECK TECHNIQUE: Multidetector CT imaging of the head and neck was performed using the standard protocol during bolus administration of intravenous contrast. Multiplanar CT image reconstructions and MIPs were obtained to evaluate the vascular anatomy. Carotid stenosis measurements (when applicable) are obtained  utilizing NASCET criteria, using the distal internal carotid diameter as the denominator. CONTRAST:  50mL ISOVUE-370 IOPAMIDOL (ISOVUE-370) INJECTION 76% COMPARISON:  Noncontrast head CT earlier today. No prior angiographic imaging. FINDINGS: CTA NECK FINDINGS Aortic arch: Normal variant aortic arch branching pattern with common origin of the brachiocephalic and left common carotid arteries. Mild arch atherosclerosis. Nonstenotic plaque and both subclavian arteries. Right carotid system: Patent with extensive, predominantly calcified plaque in the carotid bulb resulting in 50% proximal ICA stenosis. Left carotid system: Patent common carotid artery with non stenotic plaque at its origin. Mild nonstenotic plaque at the carotid bifurcation. Occlusion of the mid cervical ICA. Vertebral arteries: Patent with the right being moderately dominant. At most mild right vertebral artery origin stenosis due to calcified plaque. Skeleton: Advanced cervical facet arthrosis, asymmetrically worse on the right. Cervical disc degeneration, severe at C6-7. Other neck: No mass or enlarged lymph nodes. Upper chest: Mildly enlarged precarinal lymph node measuring 1.3 cm in short axis, indeterminate. Mild motion artifact and mosaic attenuation in the included upper lungs with enlargement of the main pulmonary artery suggesting pulmonary arterial hypertension. Review of the MIP images confirms the above findings CTA HEAD FINDINGS Anterior circulation: The intracranial right ICA is patent with moderate calcified atherosclerosis resulting in mild cavernous and proximal supraclinoid stenosis. The right ACA is patent without evidence of significant proximal stenosis. The right MCA is patent without proximal  branch occlusion or significant M1 stenosis. There is moderate atherosclerotic narrowing of multiple right MCA branch vessels. The intracranial left ICA is completely occluded, as are the left ACA and left MCA origins. The left ACA is  reconstituted via the anterior communicating artery with retrograde opacification of the A1 segment. There is relatively poor left MCA reconstitution via collaterals, with reconstitution noted at the level of the bifurcation though with multiple branch vessels being occluded. No aneurysm. Posterior circulation: The intracranial vertebral arteries are widely patent to the basilar. Patent PICA and SCA origins are visualized bilaterally. The basilar artery is patent with mild irregularity but no significant stenosis. Posterior communicating arteries are not identified and may be absent or diminutive. PCAs are patent with mild irregularity but no significant proximal stenosis. No aneurysm. Venous sinuses: As permitted by contrast timing, patent. Anatomic variants: None. Delayed phase: Not performed. Review of the MIP images confirms the above findings IMPRESSION: 1. Left ICA occlusion from its mid cervical segment through the terminus. 2. Poor reconstitution of left MCA branch vessels. 3. Left ACA reconstitution via the anterior communicating artery. 4. 50% proximal right ICA stenosis. 5.  Aortic Atherosclerosis (ICD10-I70.0). These results were communicated to Dr. Wilford Corner at 12:15 pm on 01/07/2018 by text page via the [facility] messaging system. Electronically Signed   By: Sebastian Ache M.D.   On: 01/07/2018 12:34   Mr Brain Wo Contrast  Result Date: 01/07/2018 CLINICAL DATA:  Subacute neuro deficit. Right-sided hemiplegia. History of atrial fibrillation not on Coumadin due to pontine hemorrhage EXAM: MRI HEAD WITHOUT CONTRAST TECHNIQUE: Multiplanar, multiecho pulse sequences of the brain and surrounding structures were obtained without intravenous contrast. COMPARISON:  CT and CTA from earlier today FINDINGS: Brain: Hazy restricted diffusion in the left MCA territory involving the cortex and white matter of the insula, lateral temporal lobe, and parietal lobe. Restricted diffusion extends along the internal capsule.  Unfortunately, ADC map is degraded by artifact. Despite this hazy appearance and mild extension along the medial left temporal lobe, the CTA findings and history indicate infarct rather than a cerebritis. This hazy appearance is presumably from superacute infarct. There is more dense restricted diffusion patchy along the left parietal and occipital cortex. Subcentimeter infarct in the right frontal white matter. Known subacute hemorrhage in the left pons with hemosiderin rim. The hematoma measures 1 cm in diameter and there is no associated mass effect. The hematoma has regressed from 10/27/2017 head CT. Chronic small vessel ischemia in the cerebral white matter. Remote lacune in the left corona radiata. Generalized atrophy. Vascular: Known left ICA occlusion. Skull and upper cervical spine: Negative Sinuses/Orbits: No acute finding. Other: Motion degraded. IMPRESSION: 1. Left ICA occlusion with extensive acute infarct in the left MCA distribution. Small acute infarct in the right frontal white matter. 2. 1 cm subacute hematoma in the left pons. 3. Atrophy and chronic small vessel ischemia. Electronically Signed   By: Marnee Spring M.D.   On: 01/07/2018 18:43   Ct Head Code Stroke Wo Contrast  Result Date: 01/07/2018 CLINICAL DATA:  Code stroke. Right-sided weakness. Atrial fibrillation off anticoagulation. Recent pontine bleed. EXAM: CT HEAD WITHOUT CONTRAST TECHNIQUE: Contiguous axial images were obtained from the base of the skull through the vertex without intravenous contrast. COMPARISON:  None. FINDINGS: Brain: Moderate atrophy. Moderate chronic microvascular ischemic changes in the white matter. Chronic lacunar infarction in the left corona radiata is unchanged. Hypodensity left pons compatible with prior hemorrhage. Negative for acute hemorrhage. Negative for acute infarct or mass. No  midline shift. Vascular: Asymmetric hyperdensity of the left MCA with abrupt cutoff suggesting MCA thrombus. Skull:  Negative Sinuses/Orbits: Negative Other: None ASPECTS (Alberta Stroke Program Early CT Score) - Ganglionic level infarction (caudate, lentiform nuclei, internal capsule, insula, M1-M3 cortex): 7 - Supraganglionic infarction (M4-M6 cortex): 3 Total score (0-10 with 10 being normal): 10 IMPRESSION: 1. Negative for acute infarct. Findings are very suspicious for acute left MCA thrombosis. 2. ASPECTS is 10 3. Atrophy and chronic microvascular ischemic changes. Negative for acute hemorrhage. 4. These results were discussed directly at the time of interpretation on 01/07/2018 at 11:56 am to Dr. Wilford Corner, who verbally acknowledged these results. Electronically Signed   By: Marlan Palau M.D.   On: 01/07/2018 11:57   2D Echocardiogram  - Left ventricle: The cavity size was normal. There was moderate concentric hypertrophy. Systolic function was normal. The estimated ejection fraction was in the range of 60% to 65%. Wall motion was normal; there were no regional wall motion abnormalities. Features are consistent with a pseudonormal left ventricular filling pattern, with concomitant abnormal relaxation and increased filling pressure (grade 2 diastolic dysfunction). There was no evidence of elevated ventricular filling pressure by Doppler parameters. - Aortic valve: There was mild regurgitation. - Aortic root: The aortic root was dilated measuring 43 mm. - Ascending aorta: The ascending aorta was normal in size. - Mitral valve: There was mild regurgitation. - Left atrium: The atrium was severely dilated. - Right ventricle: The cavity size was moderately dilated. Wall thickness was normal. Systolic function was normal. - Right atrium: The atrium was normal in size. - Tricuspid valve: There was mild regurgitation. - Pulmonic valve: There was no regurgitation. - Pulmonary arteries: Systolic pressure was within the normal range. - Inferior vena cava: The vessel was normal in size. - Pericardium, extracardiac: There was  no pericardial effusion.   PHYSICAL EXAM HEENT-  Normocephalic, no lesions, without obvious abnormality.  Normal external eye and conjunctiva.   Cardiovascular- S1-S2 audible, pulses palpable throughout   Lungs-no rhonchi or wheezing noted, no excessive working breathing.   Extremities- Warm, dry and intact Skin-warm and dry, no hyperpigmentation, vitiligo, or suspicious lesions  Neurological Examination Mental Status: Patient is sleeping. Difficult to waken with stimulation (just worked w/ PT). Lethargic. Mute. Does not follow commands. Cranial nerves: Pupils equal round reactive to light, he has leftward gaze preference and cannot cross midline to look to the right, he has right homonymous hemianopsia that was demonstrated by no blink to threat, he has right lower facial weakness. Motor exam: purposeful movement left upper and lower extremity to touch/pain.  Right upper extremity 0/5, right lower extremity 2/5 with triple reflex. Sensory exam: Dense hemisensory loss on the right hemibody Plantars:  Right upgoing, left downgoing Coordination: Cannot assess Gait cannot assess   ASSESSMENT/PLAN [name] is a 85 y.o. 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   Stroke:   Extensive patchy L MCA distribution infarct and small R frontal white matter infarct embolic secondary to known atrial fibrillation   Code Stroke CT head No acute stroke, no hmg. Suspicious for L MCA thrombosis. Small vessel disease. Atrophy. ASPECTS 10.     CTA head and neck L ICA occlusion.  Poor reconstitution of L MCA branches.  L ACA reconstitution at the ACom.  R ICA 50%.  Aortic atherosclerosis.  MRI  No ICA  occlusion with extensive infarct L MCA distribution. Small R frontal white matter infarct.  L pontine subacute hematoma.  Atrophy and  small vessel disease.  2D Echo  EF 60-56%. No source of embolus   LDL 108  HgbA1c 5.6  SCDs for VTE prophylaxis  NPO. Likely unable to swallow.  No antithrombotic prior to admission, now on No antithrombotic. Ok to start aspirin 300 mg suppository as NPO for secondary stroke prevention.   Therapy recommendations:  SNF  Disposition:  pending  from SNF  Given previous stroke and now recurrent stroke, anticipate limited recovery.  Will need SNF at minimum. Likely, unable to swallow and next decision will be PEG versus no PEG.  Consider Palliative care.   Atrial Fibrillation/atrial flutter w/ RVR  HR 170s during the night  Home anticoagulation:  none d/t recent pontine hmg   No AC at this time. Ok to resume rectal asa suppository  Hypertension  Stable . Permissive hypertension but gradually normalize in 5-7 days . Long-term BP goal normotensive  Hyperlipidemia  Home meds:  lipitor 20 (on hold as NPO)  LDL 108, goal < 70  Resume statin once able to swallow   Continue statin at discharge  Other Stroke Risk Factors  Advanced age  Hx stroke/TIA  10/2017 L pontine hmg with resultant right hemiparesis. Followed up with Dr. Roda Shutters 1 mo ago. Using walking w/ assistance at that time  Hospital day # 1  Annie Main, MSN, APRN, ANVP-BC, AGPCNP-BC Advanced Practice Stroke Nurse [facility] See Amion for Schedule & Pager information 01/08/2018 2:19 PM  I have personally examined this patient, reviewed notes, independently viewed imaging studies, participated in medical decision making and plan of care.ROS completed by me personally and pertinent positives fully documented  I have made any additions or clarifications directly to the above note. Agree with note above. The patient unfortunately has had a large left MCA infarct and had a pontine hemorrhage a few months ago hence was of anticoagulation. His prognosis is quite poor and family seems understanding. They do  not want aggressive stroke workup and leaning towards comfort care and palliative care approach only and I am in agreement. Long discussion with the patient's daughter and other family members at the bedside and answered questions. Greater than 50% time during this 35 minute visit was spent on counseling and coordination of care about his recent pontine hemorrhage, new left brain large stroke and discussion about goals of care and answered questions.  Delia Heady, MD Medical Director [facility] Pager: [phone] 01/08/2018 2:57 PM  To contact Stroke Continuity provider, please refer to [email or web address]. After hours, contact General Neurology

## 2018-01-08 NOTE — Evaluation (Signed)
Speech Language Pathology Evaluation Patient Details Name: [name] MRN: [id number] DOB: 1930-03-14 Today's Date: 01/08/2018 Time: 8413-2440 SLP Time Calculation (min) (ACUTE ONLY): 10 min  Problem List:  Patient Active Problem List   Diagnosis Date Noted  . Stroke (HCC) 01/07/2018  . Atrial fibrillation with rapid ventricular response (HCC) 11/11/2017  . Dysphagia following intracerebral hemorrhage 11/07/2017  . Right hemiplegia (HCC) d/t ICH 11/07/2017  . CHF (congestive heart failure) (HCC)   . Atrial flutter with rapid ventricular response (HCC)   . Leukocytosis   . L Pontine ICH (intracerebral hemorrhage) (HCC) 10/27/2017  . Chronic anticoagulation 11/19/2016  . Right bundle branch block 09/08/2014  . Atrial fibrillation with RVR (HCC) 06/30/2013  . Weight loss 06/03/2013    Class: Chronic  . Hyperlipidemia   . Essential hypertension 03/17/2009  . Atrial flutter (HCC) 03/17/2009  . Other specified cardiac dysrhythmias(427.89) 03/17/2009  . EDEMA 03/17/2009   Past Medical History:  Past Medical History:  Diagnosis Date  . Atrial fibrillation (HCC)   . Atrial flutter (HCC)   . Atrial flutter (HCC)   . Edema 03/17/2009   Qualifier: Diagnosis of  By: Flonnie Overman    . GERD (gastroesophageal reflux disease)   . HTN (hypertension)   . Hypercholesteremia   . Hyperlipidemia   . Stroke [facility])    Past Surgical History: History reviewed. No pertinent surgical history. HPI:  [name] is an 84yo male with PMH of atrial fibrillation (not on anticoagulation due to left pontine bleed in 10/2017), HTN, hyperlipidemia, and hx of CVA who presents with right sided weakness and facial droop. Daughter and son-in-law at bedside who provide additional history. Patient nonverbal and with AMS, limiting ability to obtain history from patient. He was admitted 3/2-3/13/2019 for left pontine intracerebral hemorrhage, which presented with right sided weakness, right sided facial droop,  dysarthria, and swallowing issues. At the time of discharge, he had persistent right sided weakness and swallowing issues. He was discharged to SNF for acute rehab with the eventual plan to return home. At the facility, daughter reports that he had been making some improvements in his weakness and was tolerating a mechanical soft diet. They had planned to do a home visit today to hopefully return home soon. Daughter reports that she saw the patient this morning at around 8:30am and he was at his baseline, able to feed himself breakfast and converse normally. He had no complaints when his daughter saw him. He was getting ready to work with PT a little before noon today when he was noted to be weak on the right sided, had right facial droop, and was nonverbal. Upon my evaluation, patient is nonverbal and does not respond to questions. At baseline, patient is alert and oriented. Prior to his admission in March, he was independently living at home. No smoking or alcohol use. Patient does not have a PCP, but sees a cardiologist and neurologist. MRI revealed left ICA occlusion with extensive acute infarct in the left MCA distribution, small acute infarct in the right frontal white matter, 1 cm subacute hematoma in the left pons as well as atrophy and chronic small vessel ischemia.   Assessment / Plan / Recommendation Clinical Impression  Pt presents with significant global aphasia and is currently nonverbal and despite Total A multimodal cues is noninteractiveful. Pt able to focus attention brief at SLP but is not able to imitate any verbal or physical movements. SLP provided Total A for postural control but pt is largely unable to  maintain positioning. ST to follow for education and follow up.      SLP Assessment  SLP Recommendation/Assessment: (Would appreciate Palliative Care/Hospice consult) SLP Visit Diagnosis: Aphasia (R47.01)    Follow Up Recommendations  (Hospice placement)    Frequency and Duration  min 2x/week  2 weeks      SLP Evaluation Cognition  Overall Cognitive Status: Difficult to assess Arousal/Alertness: Awake/alert Orientation Level: Disoriented X4 Attention: Focused Focused Attention: Impaired Focused Attention Impairment: Verbal basic;Functional basic       Comprehension  Auditory Comprehension Overall Auditory Comprehension: Impaired    Expression Verbal Expression Overall Verbal Expression: Impaired Written Expression Dominant Hand: Right   Oral / Motor  Motor Speech Overall Motor Speech: Impaired   GO                    [name] 01/08/2018, 1:16 PM

## 2018-01-08 NOTE — Progress Notes (Signed)
Internal Medicine Attending  Date: 01/08/2018  Patient name: [name] Medical record number: [id number] Date of birth: 15-Nov-1929 Age: 84 y.o. Gender: male  I saw and evaluated the patient. I reviewed the resident's note by Dr. Renaldo Reel and I agree with the resident's findings and plans as documented in her progress note.  Please see my H&P dated 01/08/2018 for the specifics of my evaluation, assessment, and plan from earlier in the day.

## 2018-01-08 NOTE — H&P (Signed)
Internal Medicine Attending Admission Note Date: 01/08/2018  Patient name: [name] Medical record number: [id number] Date of birth: 01-21-1930 Age: 84 y.o. Gender: male  I saw and evaluated the patient. I reviewed the resident's note and I agree with the resident's findings and plan as documented in the resident's note.  Chief Complaint(s): Acute right-sided weakness and left gaze preference  History - key components related to admission:  Mr. Gosse is an 84 year old man with a history of atrial fibrillation on anticoagulation until March 2019 when he suffered a left pontine bleed and his anticoagulation was discontinued. He was recovering in a nursing home until the day of admission when,prior to working with physical therapy, he was noted to have the sudden onset of right sided flaccid paralysis with a left gaze preference and aphasia. He was brought to the emergency department and found to have an extensive acute infarct in the left MCA distribution. He was therefore admitted to the internal medicine teaching service for further evaluation and care.  When seen on rounds the morning after admission Mr. Jutras was difficult to arouse. He did not respond to our voice but did briefly open his eyes to his daughter's voice.  Several family members were present and understand the gravity of this most recent stroke.  Physical Exam - key components related to admission:  Vitals:   01/08/18 0517 01/08/18 0820 01/08/18 1217 01/08/18 1540  BP: (!) 122/93 (!) 129/98 135/83 107/70  Pulse: 73 (!) 122 (!) 112 (!) 121  Resp: 18 20 20 20   Temp: 98.5 F (36.9 C) 98.1 F (36.7 C) 99 F (37.2 C) 98.4 F (36.9 C)  TempSrc: Oral Oral Axillary Axillary  SpO2: 92% 95% 97% 97%  Weight:       Gen.: Well-developed, well-nourished, man lying in bed with Cheyne-Stokes respirations. He did not follow commands to our voice but briefly opened his eyes to his daughter's voice.  Lab results:  Basic  Metabolic Panel: Recent Labs    01/07/18 1143 01/07/18 1152  NA 137 139  K 3.8 3.7  CL 103 100*  CO2 27  --   GLUCOSE 122* 119*  BUN 13 14  CREATININE 1.06 0.90  CALCIUM 9.0  --    Liver Function Tests: Recent Labs    01/07/18 1143  AST 45*  ALT 38  ALKPHOS 167*  BILITOT 0.9  PROT 6.3*  ALBUMIN 2.8*   CBC: Recent Labs    01/07/18 1143 01/07/18 1152  WBC 10.4  --   NEUTROABS 5.9  --   HGB 13.7 13.6  HCT 40.7 40.0  MCV 100.7*  --   PLT 278  --    Hemoglobin A1C: Recent Labs    01/08/18 0600  HGBA1C 5.6   Fasting Lipid Panel: Recent Labs    01/08/18 0600  CHOL 162  HDL 39*  LDLCALC 108*  TRIG 73  CHOLHDL 4.2   Coagulation: Recent Labs    01/07/18 1143  INR 1.11   Alcohol Level: Recent Labs    01/07/18 1143  ETH <10   Urinalysis:  Clear, straw-colored, specific gravity 1.013, pH 7.0, small leukocytes, 0-5 red blood cells per high-power field, 6-10 white blood cells per high-power field.  Imaging results:  Ct Angio Head W Or Wo Contrast  Result Date: 01/07/2018 CLINICAL DATA:  Code stroke. Right-sided weakness. Atrial fibrillation. Dense left ICA terminus on CT. EXAM: CT ANGIOGRAPHY HEAD AND NECK TECHNIQUE: Multidetector CT imaging of the head and neck was performed using  the standard protocol during bolus administration of intravenous contrast. Multiplanar CT image reconstructions and MIPs were obtained to evaluate the vascular anatomy. Carotid stenosis measurements (when applicable) are obtained utilizing NASCET criteria, using the distal internal carotid diameter as the denominator. CONTRAST:  50mL ISOVUE-370 IOPAMIDOL (ISOVUE-370) INJECTION 76% COMPARISON:  Noncontrast head CT earlier today. No prior angiographic imaging. FINDINGS: CTA NECK FINDINGS Aortic arch: Normal variant aortic arch branching pattern with common origin of the brachiocephalic and left common carotid arteries. Mild arch atherosclerosis. Nonstenotic plaque and both subclavian  arteries. Right carotid system: Patent with extensive, predominantly calcified plaque in the carotid bulb resulting in 50% proximal ICA stenosis. Left carotid system: Patent common carotid artery with non stenotic plaque at its origin. Mild nonstenotic plaque at the carotid bifurcation. Occlusion of the mid cervical ICA. Vertebral arteries: Patent with the right being moderately dominant. At most mild right vertebral artery origin stenosis due to calcified plaque. Skeleton: Advanced cervical facet arthrosis, asymmetrically worse on the right. Cervical disc degeneration, severe at C6-7. Other neck: No mass or enlarged lymph nodes. Upper chest: Mildly enlarged precarinal lymph node measuring 1.3 cm in short axis, indeterminate. Mild motion artifact and mosaic attenuation in the included upper lungs with enlargement of the main pulmonary artery suggesting pulmonary arterial hypertension. Review of the MIP images confirms the above findings CTA HEAD FINDINGS Anterior circulation: The intracranial right ICA is patent with moderate calcified atherosclerosis resulting in mild cavernous and proximal supraclinoid stenosis. The right ACA is patent without evidence of significant proximal stenosis. The right MCA is patent without proximal branch occlusion or significant M1 stenosis. There is moderate atherosclerotic narrowing of multiple right MCA branch vessels. The intracranial left ICA is completely occluded, as are the left ACA and left MCA origins. The left ACA is reconstituted via the anterior communicating artery with retrograde opacification of the A1 segment. There is relatively poor left MCA reconstitution via collaterals, with reconstitution noted at the level of the bifurcation though with multiple branch vessels being occluded. No aneurysm. Posterior circulation: The intracranial vertebral arteries are widely patent to the basilar. Patent PICA and SCA origins are visualized bilaterally. The basilar artery is  patent with mild irregularity but no significant stenosis. Posterior communicating arteries are not identified and may be absent or diminutive. PCAs are patent with mild irregularity but no significant proximal stenosis. No aneurysm. Venous sinuses: As permitted by contrast timing, patent. Anatomic variants: None. Delayed phase: Not performed. Review of the MIP images confirms the above findings IMPRESSION: 1. Left ICA occlusion from its mid cervical segment through the terminus. 2. Poor reconstitution of left MCA branch vessels. 3. Left ACA reconstitution via the anterior communicating artery. 4. 50% proximal right ICA stenosis. 5.  Aortic Atherosclerosis (ICD10-I70.0). These results were communicated to Dr. Wilford Corner at 12:15 pm on 01/07/2018 by text page via the [facility] messaging system. Electronically Signed   By: Sebastian Ache M.D.   On: 01/07/2018 12:34   Ct Angio Neck W Or Wo Contrast  Result Date: 01/07/2018 CLINICAL DATA:  Code stroke. Right-sided weakness. Atrial fibrillation. Dense left ICA terminus on CT. EXAM: CT ANGIOGRAPHY HEAD AND NECK TECHNIQUE: Multidetector CT imaging of the head and neck was performed using the standard protocol during bolus administration of intravenous contrast. Multiplanar CT image reconstructions and MIPs were obtained to evaluate the vascular anatomy. Carotid stenosis measurements (when applicable) are obtained utilizing NASCET criteria, using the distal internal carotid diameter as the denominator. CONTRAST:  50mL ISOVUE-370 IOPAMIDOL (ISOVUE-370) INJECTION 76% COMPARISON:  Noncontrast head CT earlier today. No prior angiographic imaging. FINDINGS: CTA NECK FINDINGS Aortic arch: Normal variant aortic arch branching pattern with common origin of the brachiocephalic and left common carotid arteries. Mild arch atherosclerosis. Nonstenotic plaque and both subclavian arteries. Right carotid system: Patent with extensive, predominantly calcified plaque in the carotid bulb resulting  in 50% proximal ICA stenosis. Left carotid system: Patent common carotid artery with non stenotic plaque at its origin. Mild nonstenotic plaque at the carotid bifurcation. Occlusion of the mid cervical ICA. Vertebral arteries: Patent with the right being moderately dominant. At most mild right vertebral artery origin stenosis due to calcified plaque. Skeleton: Advanced cervical facet arthrosis, asymmetrically worse on the right. Cervical disc degeneration, severe at C6-7. Other neck: No mass or enlarged lymph nodes. Upper chest: Mildly enlarged precarinal lymph node measuring 1.3 cm in short axis, indeterminate. Mild motion artifact and mosaic attenuation in the included upper lungs with enlargement of the main pulmonary artery suggesting pulmonary arterial hypertension. Review of the MIP images confirms the above findings CTA HEAD FINDINGS Anterior circulation: The intracranial right ICA is patent with moderate calcified atherosclerosis resulting in mild cavernous and proximal supraclinoid stenosis. The right ACA is patent without evidence of significant proximal stenosis. The right MCA is patent without proximal branch occlusion or significant M1 stenosis. There is moderate atherosclerotic narrowing of multiple right MCA branch vessels. The intracranial left ICA is completely occluded, as are the left ACA and left MCA origins. The left ACA is reconstituted via the anterior communicating artery with retrograde opacification of the A1 segment. There is relatively poor left MCA reconstitution via collaterals, with reconstitution noted at the level of the bifurcation though with multiple branch vessels being occluded. No aneurysm. Posterior circulation: The intracranial vertebral arteries are widely patent to the basilar. Patent PICA and SCA origins are visualized bilaterally. The basilar artery is patent with mild irregularity but no significant stenosis. Posterior communicating arteries are not identified and may be  absent or diminutive. PCAs are patent with mild irregularity but no significant proximal stenosis. No aneurysm. Venous sinuses: As permitted by contrast timing, patent. Anatomic variants: None. Delayed phase: Not performed. Review of the MIP images confirms the above findings IMPRESSION: 1. Left ICA occlusion from its mid cervical segment through the terminus. 2. Poor reconstitution of left MCA branch vessels. 3. Left ACA reconstitution via the anterior communicating artery. 4. 50% proximal right ICA stenosis. 5.  Aortic Atherosclerosis (ICD10-I70.0). These results were communicated to Dr. Wilford Corner at 12:15 pm on 01/07/2018 by text page via the [facility] messaging system. Electronically Signed   By: Sebastian Ache M.D.   On: 01/07/2018 12:34   Mr Brain Wo Contrast  Result Date: 01/07/2018 CLINICAL DATA:  Subacute neuro deficit. Right-sided hemiplegia. History of atrial fibrillation not on Coumadin due to pontine hemorrhage EXAM: MRI HEAD WITHOUT CONTRAST TECHNIQUE: Multiplanar, multiecho pulse sequences of the brain and surrounding structures were obtained without intravenous contrast. COMPARISON:  CT and CTA from earlier today FINDINGS: Brain: Hazy restricted diffusion in the left MCA territory involving the cortex and white matter of the insula, lateral temporal lobe, and parietal lobe. Restricted diffusion extends along the internal capsule. Unfortunately, ADC map is degraded by artifact. Despite this hazy appearance and mild extension along the medial left temporal lobe, the CTA findings and history indicate infarct rather than a cerebritis. This hazy appearance is presumably from superacute infarct. There is more dense restricted diffusion patchy along the left parietal and occipital cortex. Subcentimeter infarct in the  right frontal white matter. Known subacute hemorrhage in the left pons with hemosiderin rim. The hematoma measures 1 cm in diameter and there is no associated mass effect. The hematoma has regressed  from 10/27/2017 head CT. Chronic small vessel ischemia in the cerebral white matter. Remote lacune in the left corona radiata. Generalized atrophy. Vascular: Known left ICA occlusion. Skull and upper cervical spine: Negative Sinuses/Orbits: No acute finding. Other: Motion degraded. IMPRESSION: 1. Left ICA occlusion with extensive acute infarct in the left MCA distribution. Small acute infarct in the right frontal white matter. 2. 1 cm subacute hematoma in the left pons. 3. Atrophy and chronic small vessel ischemia. Electronically Signed   By: Marnee Spring M.D.   On: 01/07/2018 18:43   Ct Head Code Stroke Wo Contrast  Result Date: 01/07/2018 CLINICAL DATA:  Code stroke. Right-sided weakness. Atrial fibrillation off anticoagulation. Recent pontine bleed. EXAM: CT HEAD WITHOUT CONTRAST TECHNIQUE: Contiguous axial images were obtained from the base of the skull through the vertex without intravenous contrast. COMPARISON:  None. FINDINGS: Brain: Moderate atrophy. Moderate chronic microvascular ischemic changes in the white matter. Chronic lacunar infarction in the left corona radiata is unchanged. Hypodensity left pons compatible with prior hemorrhage. Negative for acute hemorrhage. Negative for acute infarct or mass. No midline shift. Vascular: Asymmetric hyperdensity of the left MCA with abrupt cutoff suggesting MCA thrombus. Skull: Negative Sinuses/Orbits: Negative Other: None ASPECTS (Alberta Stroke Program Early CT Score) - Ganglionic level infarction (caudate, lentiform nuclei, internal capsule, insula, M1-M3 cortex): 7 - Supraganglionic infarction (M4-M6 cortex): 3 Total score (0-10 with 10 being normal): 10 IMPRESSION: 1. Negative for acute infarct. Findings are very suspicious for acute left MCA thrombosis. 2. ASPECTS is 10 3. Atrophy and chronic microvascular ischemic changes. Negative for acute hemorrhage. 4. These results were discussed directly at the time of interpretation on 01/07/2018 at 11:56 am to  Dr. Wilford Corner, who verbally acknowledged these results. Electronically Signed   By: Marlan Palau M.D.   On: 01/07/2018 11:57   Other results:  EKG: Personally reviewed. Sinus arrhythmia best appreciated in lead V1, left axis deviation, left anterior hemiblock, no significant Q waves, no LVH by voltage, poor R-wave progression, no ST changes, T-wave flattening in inferolateral leads. The rate is slower than on the prior ECG from 11/11/2017.  Assessment & Plan by Problem:  Mr. Lazarz is an 84 year old man with a history of atrial fibrillation on anticoagulation until March 2019 when he suffered a left pontine bleed and his anticoagulation was discontinued. He was recovering in a nursing home until the day of admission when,prior to working with physical therapy, he was noted to have the sudden onset of right sided flaccid paralysis with a left gaze preference and aphasia. He was brought to the emergency department and found to have an extensive acute infarct in the left MCA distribution likely related to a thrombus from his atrial fibrillation.  His prognosis is exceptionally poor.  We are awaiting any clinical recovery over the next 24-48 hours, but the family is interested in discussing palliative care options.  1)  Extensive acute infarct in the left MCA distribution: Significant neurologic insult at this time. We will await for any clinical improvement. In the meantime, we will allow permissive hypertension. We are holding anticoagulation for his atrial fibrillation given the recent bleed.  Given he is unable to take any oral medications we will treat atrial fibrillation with rapid ventricular rate via the intravenous route.  Palliative care has been consulted to discuss  options, including hospice, with the family.

## 2018-01-08 NOTE — Progress Notes (Signed)
   Subjective:  Mr. Flott was seen lying in bed this morning with multiple family members at bedside. Daughter is an Charity fundraiser and is Product manager, also son and granddaughter. Family reports that patient intermittently will open eyes and follow commands to their familiar voices, but otherwise has not been able to speak. Did see Stroke team this morning and family aware that degree of recovery is unclear and prognosis is poor. Family interested in Palliative Medicine consult today.  Objective:  Vital signs in last 24 hours: Vitals:   01/08/18 0023 01/08/18 0100 01/08/18 0300 01/08/18 0517  BP: 131/87 99/71 (!) 114/59 (!) 122/93  Pulse: (!) 142 (!) 56  73  Resp: 20   18  Temp: 98.8 F (37.1 C)   98.5 F (36.9 C)  TempSrc: Oral   Oral  SpO2: 98% 90% 97% 92%  Weight:       GEN: Elderly male lying in bed, not alert. Intermittent opens eyes to daughter's voice, closes eyes again. Nonverbal. NAD RESP: Cheyne-Stokes breathing, appears comfortable on RA CV: Irregularly irregular rhythm, no m/r/g EXT: Trace BLE edema NEURO: Unable to complete full neuro exam 2/2 AMS, does not follow commands. Will move LUE occasionally. Nonverbal  Assessment/Plan:  Principal Problem:   Stroke [facility])  Mr. 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 Neurology Stroke team is on board and prognosis is guarded. Family interested in Palliative Medicine.  Extensive left MCA infarct Small acute infarct of R frontal white matter Hx of left pontine hemorrhagic stroke in 10/2017 Stroke team on board, patient intermittently awake, but nonverbal and likely aphasic given extent of infarct seen on imaging. Patient exhibiting Cheyne-Stokes breathing today. Discussed  with Stroke NP, who recommends watching for 1-2 days for any recovery but this is unlikely. Family is aware of poor prognosis and interested in Palliative Medicine consult to determine treatment goals and next steps. Patient is confirmed DNR/DNI. - Neurology consulted; appreciate their assistance - Allow for permissive HTN in the setting of CVA  - NPO until SLP eval - PT/OT eval - Atorvastatin  daily - Palliative Medicine consulted; appreciate their assistance  Hx of afib Previously on warfarin, however this was discontinued in March 2019 due to hemorrhagic stroke. Intermittently in afib with RVR, requiring IV metoprolol. Since patient unable to tolerate PO meds at this time, will continue IV as needed. - Holding home metoprolol and diltiazem 2/2 NPO status - IV metoprolol  PRN for HR >120  Hx of HTN Home regimen includes diltiazem  q8h and metoprolol  BID. BP 122/93 - Holding home diltiazem and metoprolol  Dispo: Anticipated discharge in approximately 1-2 day(s).   Scherrie Gerlach, MD 01/08/2018, 6:28 AM Pager: Demetrius Charity [phone]

## 2018-01-08 NOTE — Discharge Summary (Signed)
Name: [name] MRN: [id number] DOB: 09/13/1929 84 y.o. PCP: Patient, No Pcp Per  Date of Admission: 01/07/2018 11:40 AM Date of Discharge: 01/10/2018 Attending Physician: Tyson Alias, *  Discharge Diagnosis: 1. Embolic stroke - Extensive patchy L MCA infarct and small R frontal white matter infarct 2. Atrial fibrillation w/ RVR  Principal Problem:   Stroke [facility]) Active Problems:   Oropharyngeal dysphagia   Acute ischemic stroke [facility])   Atrial fibrillation [facility])   Palliative care by specialist   DNR (do not resuscitate)   Pain, generalized   Discharge Medications: Allergies as of 01/10/2018   No Known Allergies     Medication List    STOP taking these medications   atorvastatin 20 MG tablet Commonly known as:  LIPITOR   CALCIUM 600+D3 PO   diltiazem 90 MG tablet Commonly known as:  CARDIZEM   docusate sodium 100 MG capsule Commonly known as:  COLACE   metoprolol tartrate 50 MG tablet Commonly known as:  LOPRESSOR   multivitamin with minerals tablet   pantoprazole 40 MG tablet Commonly known as:  PROTONIX   polyethylene glycol packet Commonly known as:  MIRALAX / GLYCOLAX   QUEtiapine 25 MG tablet Commonly known as:  SEROQUEL   Vitamin D-3 1000 units Caps     TAKE these medications   LORazepam 1 MG tablet Commonly known as:  ATIVAN Take 1 tablet (1 mg total) by mouth every 6 (six) hours as needed for up to 5 days for anxiety or sleep.   morphine 10 MG/5ML solution Take 2.5 mLs (5 mg total) by mouth every hour as needed for up to 5 days.   morphine CONCENTRATE 10 MG/0.5ML Soln concentrated solution Take 0.25 mLs (5 mg total) by mouth every 6 (six) hours for 5 days.       Disposition and follow-up:   Mr.[name] was discharged from [facility] in [place] condition.  At the hospital follow up visit please address:  1.  FULL COMFORT CARE - Sips for comfort (patient is high aspiration risk), and  ativan/morphine PRN  2.  Labs / imaging needed at time of follow-up: None  3.  Pending labs/ test needing follow-up: None  Follow-up Appointments:   Hospital Course by problem list: Principal Problem:   Stroke [facility]) Active Problems:   Oropharyngeal dysphagia   Acute ischemic stroke [facility])   Atrial fibrillation (HCC)   Palliative care by specialist   DNR (do not resuscitate)   Pain, generalized   1. Embolic stroke - Extensive patchy L MCA infarct and small R frontal white matter infarct Hx of left pontine hemorrhagic stroke in 10/2017 Patient has a history of left pontine hemorrhagic stroke in 10/2017 and thus had been off of anticoagulation for afib since then. He had been making some recovery at his SNF, until he experienced acute onset of right sided weakness and facial droop and aphasia that started the morning of admission. MRI confirmed extensive L MCA infarct and small R frontal white matter infarct, likely embolic secondary to afib. Patient was not a tPA candidate, per Neurology recommendations. Patient continued to be aphasic and unable to swallow or follow commands. Several discussions were held with the family members at bedside and family aware that patient has a poor prognosis with likely little recovery. Patient was already DNR/DNI and Palliative was consulted to assist in goals of care discussion. Family at bedside agreed that patient would prefer to be full comfort care and were  understanding of the patient's poor prognosis. He was re-evaluated this next morning with minimal improvement in his symptoms. Patient appears comfortable and has PRN morphine and ativan available. He may take sips for comfort, acknowledging his known high aspiration risk. He was discharged to [facility] residential hospice to continue full comfort care.   2. Atrial fibrillation w/ RVR (not on chronic anticoagulation) Patient has a hx of afib and had previously been on chronic anticoagulation, as well  as diltiazem and metoprolol for rate control. He experienced a left pontine hemorrhagic stroke in 10/2017 and thus anticoagulation was discontinued during that admission. He presented this admission with embolic stroke in the setting of no anticoagulation for afib. He intermittently went in and out of RVR during his hospital stay, which did resolve with IV metoprolol (unable to take PO meds due to aspiration risk). After 2 days inpatient, the decision was made to proceed with full comfort care and his home medications were discontinued.  Discharge Vitals:   BP (!) 158/107 (BP Location: Left Arm)   Pulse (!) 134   Temp 97.7 F (36.5 C) (Oral)   Resp 18   Wt 169 lb 1.5 oz (76.7 kg)   SpO2 99%   BMI 24.26 kg/m   Pertinent Labs, Studies, and Procedures:  CBC Latest Ref Rng & Units 01/07/2018 01/07/2018 11/16/2017  WBC 4.0 - 10.5 K/uL - 10.4 12.6(H)  Hemoglobin 13.0 - 17.0 g/dL 16.1 09.6 04.5  Hematocrit 39.0 - 52.0 % 40.0 40.7 42.8  Platelets 150 - 400 K/uL - 278 318   CMP Latest Ref Rng & Units 01/07/2018 01/07/2018 11/16/2017  Glucose 65 - 99 mg/dL 409(W) 119(J) 478(G)  BUN 6 - 20 mg/dL Creatinine 0.61 - 1.24 mg/dL 9.56 2.13 0.86  Sodium 135 - 145 mmol/L 139 137 140  Potassium 3.5 - 5.1 mmol/L 3.7 3.8 3.7  Chloride 101 - 111 mmol/L 100(L) 103 106  CO2 22 - 32 mmol/L - 27 23  Calcium 8.9 - 10.3 mg/dL - 9.0 5.7(Q)  Total Protein 6.5 - 8.1 g/dL - 6.3(L) -  Total Bilirubin 0.3 - 1.2 mg/dL - 0.9 -  Alkaline Phos 38 - 126 U/L - 167(H) -  AST 15 - 41 U/L - 45(H) -  ALT 17 - 63 U/L - 38 -   CT head 01/07/2018 1. Negative for acute infarct. Findings are very suspicious for acute left MCA thrombosis. 2. ASPECTS is 10 3. Atrophy and chronic microvascular ischemic changes. Negative for acute hemorrhage.  CT-A head/neck 01/07/2018 1. Left ICA occlusion from its mid cervical segment through the terminus. 2. Poor reconstitution of left MCA branch vessels. 3. Left ACA reconstitution via  the anterior communicating artery. 4. 50% proximal right ICA stenosis. 5. Aortic Atherosclerosis (ICD10-I70.0).  MRI brain 01/07/2018 1. Left ICA occlusion with extensive acute infarct in the left MCA distribution. Small acute infarct in the right frontal white matter. 2. 1 cm subacute hematoma in the left pons. 3. Atrophy and chronic small vessel ischemia.  Discharge Instructions: Discharge Instructions    Discharge instructions   Complete by:  As directed    I enjoyed taking care of you, Mr. Dass. Please take care and we will help try to keep you comfortable.     Signed: Scherrie Gerlach, MD 01/10/2018, 11:05 AM   Pager: Demetrius Charity [phone]

## 2018-01-08 NOTE — Progress Notes (Signed)
Night Team Interval Progress Note Notified regarding pt restlessness. On evaluation, pt had pulled off gown/sheets, tele leads, IVF disconnected to avoid loss of IV. Pt periodically pulling at SCDs and at side of bed with L arm, R arm remains flaccid. Pt not following commands. Re-direction attempted though pt unlikely to comprehend given CVA insult. At this point, will avoid any pharmacologic agents that would suppress mental status and mask any change in a neuro exam, as well as potentially further lower BP--currently needs permissive HTN for acute CVA. SCDs removed for the time being, as well as telemetry. Will attempt to replace as indicated, and continue re-direction. Hopefully a period of decreased stimuli and transition to morning hours will improve restlessness.

## 2018-01-08 NOTE — Evaluation (Signed)
Occupational Therapy Evaluation Patient Details Name: [name] MRN: [id number] DOB: July 21, 1930 Today's Date: 01/08/2018    History of Present Illness 84yo male with PMH of atrial fibrillation (not on anticoagulation due to left pontine bleed in 10/2017), HTN, hyperlipidemia, and hx of CVA who presents with right sided weakness and facial droop. Imaging revealed large left MCA CVA.   Clinical Impression   This 84 y/o male presents with the above. Pt was most recently receiving therapy services in SNF setting prior to this admission. Per family report, while in SNF and working with therapy pt was completing transfers with ModA, was requiring MaxA for bathing/dressing ADLs, was completing self-feeding and demonstrating increased use/movement of RUE, though not using UE functionally. Pt presenting with absent movement of RUE/RLE, cognitive impairments, and decreased dynamic sitting balance and functional performance. Pt requiring max+2 for bed mobility, maxA (at times totalA) for static sitting balance EOB; currently requires totalA for UB/LB ADLs, demonstrating ability to complete simple face washing task this session given increased time and assist for placing items in L hand. Pt intermittently following one step commands given increased time and multimodal cues. Pt will benefit from continued acute OT services and recommend continued SNF level OT services after discharge to maximize his overall safety and independence with ADLs and mobility.     Follow Up Recommendations  SNF;Supervision/Assistance - 24 hour    Equipment Recommendations  Other (comment)(TBD in next venue )           Precautions / Restrictions Precautions Precautions: Fall Restrictions Weight Bearing Restrictions: No      Mobility Bed Mobility Overal bed mobility: Needs Assistance Bed Mobility: Rolling;Sidelying to Sit;Sit to Supine Rolling: Max assist;+2 for physical assistance Sidelying to sit: +2 for  physical assistance;Total assist   Sit to supine: +2 for physical assistance;Total assist   General bed mobility comments: Patient max assist for rolling to the right side, able to use LUE to hold rail. Assist to move LEs to EOB and elevate trunk to upright. Hard right lean (left pushing). Total assist to return to supine and reposition in bed  Transfers                 General transfer comment: unable to attempt    Balance Overall balance assessment: Needs assistance Sitting-balance support: Feet supported Sitting balance-Leahy Scale: Poor Sitting balance - Comments: hard right lean (left push), max to total assist to maintain sitting balance Postural control: Right lateral lean;Posterior lean                                 ADL either performed or assessed with clinical judgement   ADL Overall ADL's : Needs assistance/impaired Eating/Feeding: NPO   Grooming: Sitting;Moderate assistance Grooming Details (indicate cue type and reason): pt able to wash face seated EOB with increased time and multiple cues; requires max-totalA to maintain sitting balance EOB                               General ADL Comments: pt requiring Max-totalA+2 for bed mobility and max-totalA to maintain static sitting balance as pt with strong lean towards the R; able to complete simple grooming task, otherwise pt currently requiring Max-totalA for UB/LB ADLs at bed level      Vision   Vision Assessment?: Vision impaired- to be further tested in functional context Additional Comments:  difficult to fully assess due to impaired cognition; pt intermittently makes eye contact with therapist and noted to track to L and R, but does not do so consistently; noted to track towards and locate daughter when she entered room and was standing to the L of pt's visual field; pt with impaired perception demonstrating significant difficulty locating and grasping washcloth when reaching with LUE  to do so                 Pertinent Vitals/Pain Pain Assessment: Faces Pain Location: right UE Pain Descriptors / Indicators: Grimacing Pain Intervention(s): Monitored during session     Hand Dominance Right   Extremity/Trunk Assessment Upper Extremity Assessment Upper Extremity Assessment: Generalized weakness;Difficult to assess due to impaired cognition RUE Deficits / Details: no active movement note, increased edema; increased stiffness noted with PROM to digits  RUE Coordination: (absent )   Lower Extremity Assessment Lower Extremity Assessment: Defer to PT evaluation       Communication Communication Communication: Expressive difficulties   Cognition Arousal/Alertness: Awake/alert Behavior During Therapy: Flat affect Overall Cognitive Status: Difficult to assess Area of Impairment: Orientation;Attention;Following commands;Safety/judgement;Awareness;Problem solving                   Current Attention Level: Focused   Following Commands: Follows one step commands inconsistently;Follows one step commands with increased time   Awareness: Intellectual Problem Solving: Slow processing;Decreased initiation;Difficulty sequencing;Requires verbal cues;Requires tactile cues General Comments: pt intermittently follows one step commands and focus on therapist given increased time for process and multimodal cues    General Comments  max HR noted to be 110 with use of pulse ox while sitting EOB                Home Living Family/patient expects to be discharged to:: Skilled nursing facility   Available Help at Discharge: Skilled Nursing Facility Type of Home: Skilled Nursing Facility                                  Prior Functioning/Environment Level of Independence: Needs assistance  Gait / Transfers Assistance Needed: was non ambulatory, and moderate assist for transfers. began attempting steps in parallell bars ADL's / Homemaking Assistance  Needed: assist from staff and family, Reports that patient was able to feed himself with set up at times but was otherwise requiring MaxA for bathing/dressing ADLs            OT Problem List: Decreased strength;Impaired balance (sitting and/or standing);Decreased cognition;Decreased range of motion;Impaired vision/perception;Impaired UE functional use;Decreased activity tolerance;Decreased coordination      OT Treatment/Interventions: Self-care/ADL training;DME and/or AE instruction;Therapeutic activities;Balance training;Therapeutic exercise;Manual therapy;Cognitive remediation/compensation;Visual/perceptual remediation/compensation;Patient/family education    OT Goals(Current goals can be found in the care plan section) Acute Rehab OT Goals Patient Stated Goal: none stated OT Goal Formulation: Patient unable to participate in goal setting Time For Goal Achievement: 01/22/18 Potential to Achieve Goals: Fair  OT Frequency: Min 2X/week               Co-evaluation PT/OT/SLP Co-Evaluation/Treatment: Yes Reason for Co-Treatment: Complexity of the patient's impairments (multi-system involvement);For patient/therapist safety;To address functional/ADL transfers;Necessary to address cognition/behavior during functional activity PT goals addressed during session: Mobility/safety with mobility;Balance OT goals addressed during session: ADL's and self-care      AM-PAC PT "6 Clicks" Daily Activity     Outcome Measure Help from another person eating meals?: Total Help from another  person taking care of personal grooming?: A Lot Help from another person toileting, which includes using toliet, bedpan, or urinal?: Total Help from another person bathing (including washing, rinsing, drying)?: Total Help from another person to put on and taking off regular upper body clothing?: Total Help from another person to put on and taking off regular lower body clothing?: Total 6 Click Score: 7   End of  Session Nurse Communication: Mobility status  Activity Tolerance: Patient tolerated treatment well;Patient limited by fatigue Patient left: in bed;with call bell/phone within reach;with bed alarm set;with family/visitor present  OT Visit Diagnosis: Muscle weakness (generalized) (M62.81);Other symptoms and signs involving cognitive function;Hemiplegia and hemiparesis Hemiplegia - Right/Left: Right Hemiplegia - dominant/non-dominant: Dominant Hemiplegia - caused by: Cerebral infarction                Time: 1610-9604 OT Time Calculation (min): 23 min Charges:  OT General Charges $OT Visit: 1 Visit OT Evaluation $OT Eval Moderate Complexity: 1 Mod G-Codes:     Marcy Siren, OT Pager [phone] 01/08/2018  Orlando Penner 01/08/2018, 2:29 PM

## 2018-01-09 DIAGNOSIS — R52 Pain, unspecified: Secondary | ICD-10-CM

## 2018-01-09 DIAGNOSIS — Z515 Encounter for palliative care: Secondary | ICD-10-CM

## 2018-01-09 DIAGNOSIS — Z66 Do not resuscitate: Secondary | ICD-10-CM

## 2018-01-09 DIAGNOSIS — I69351 Hemiplegia and hemiparesis following cerebral infarction affecting right dominant side: Secondary | ICD-10-CM

## 2018-01-09 MED ORDER — MORPHINE SULFATE (CONCENTRATE) 10 MG/0.5ML PO SOLN
5.0000 mg | Freq: Four times a day (QID) | ORAL | Status: DC
  Administered 2018-01-09 – 2018-01-10 (×4): 5 mg via ORAL
  Filled 2018-01-09 (×4): qty 0.5

## 2018-01-09 NOTE — Progress Notes (Addendum)
STROKE TEAM PROGRESS NOTE   INTERVAL HISTORY Family at bedside. Pt currently received comfort care. They are making a referral to residential hospice. Waiting to see if bed is available in [place]  Vitals:   01/08/18 2032 01/09/18 0041 01/09/18 0537 01/09/18 0816  BP: 119/69 113/86 (!) 133/101 (!) 133/92  Pulse: (!) 127 (!) 151 (!) 127 (!) 125  Resp: (!) 1  Temp: 98.3 F (36.8 C) 98.5 F (36.9 C) 98.7 F (37.1 C) 98.6 F (37 C)  TempSrc: Oral Oral Oral Oral  SpO2: 95% 93% 96% 96%  Weight:        Neurological Examination per Dr. Pearlean Brownie Mental Status: Patient is sleeping. Difficult to waken with stimulation. Lethargic. Mute. Does not follow commands. Cranial nerves: Pupils equal round reactive to light, he has leftward gaze preference and cannot cross midline to look to the right, he has right homonymous hemianopsia that was demonstrated by no blink to threat, he has right lower facial weakness. Motor exam: purposeful movement left upper and lower extremity to touch/pain.  Right upper extremity 0/5, right lower extremity 2/5 with triple reflex. Sensory exam: Dense hemisensory loss on the right hemibody Plantars:  Right upgoing, left downgoing Coordination: Cannot assess Gait cannot assess   ASSESSMENT/PLAN Mr. JORIAN WILLHOITE is a 84 y.o. 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   Stroke:   Extensive patchy L MCA distribution infarct and small R frontal white matter infarct embolic secondary to known atrial fibrillation   Code Stroke CT head No acute stroke, no hmg. Suspicious for L MCA thrombosis. Small vessel disease. Atrophy. ASPECTS 10.     CTA head and neck L ICA occlusion.  Poor reconstitution of L MCA branches.  L ACA reconstitution at the ACom.  R ICA 50%.  Aortic atherosclerosis.  MRI  No ICA  occlusion with extensive infarct L MCA distribution. Small R frontal white matter infarct.  L pontine subacute hematoma.  Atrophy and small vessel disease.  2D Echo  EF 60-56%. No source of embolus   LDL 108  HgbA1c 5.6  NPO.   No antithrombotic prior to admission, now on No antithrombotic.   Therapy recommendations:  SNF  Disposition:  Plans for residential hospice  Family has opted for comfort care.   Stroke team will sign off. Available as needed.   Atrial Fibrillation/atrial flutter w/ RVR  Home anticoagulation:  none d/t recent pontine hmg   Hypertension  Stable  Hyperlipidemia  Home meds:  lipitor 20   LDL 108, goal < 70  Other Stroke Risk Factors  Advanced age  Hx stroke/TIA  10/2017 L pontine hmg with resultant right hemiparesis. Followed up with Dr. Roda Shutters 1 mo ago. Using walking w/ assistance at that time  Hospital day # 2  Annie Main, MSN, APRN, ANVP-BC, AGPCNP-BC Advanced Practice Stroke Nurse [facility] See Amion for Schedule & Pager information 01/09/2018 8:27 AM  I had a long discussion with the patient and daughter and other family members at the bedside. Family seems quite comfortable with the decision of hospice and palliative care only which I think is appropriate given the patient's situation. Continue morphine drip and anticipate transfer to residential hospice over the next few days. Family is in agreement with plan. Stroke will sign off. Kindly call for questions. Delia Heady, MD To contact Stroke  Continuity provider, please refer to [email or web address] After hours, contact General Neurology

## 2018-01-09 NOTE — Progress Notes (Signed)
Patient ID: [name], male   DOB: 1930/01/24, 84 y.o.   MRN: [id number]  This NP visited patient at the bedside as a follow up to  yesterday's GOCs meeting.  Patient remains lethargic and unable to follow simple commands.  Per nursing he has refused any kind of oral intake sips/bites.    Please call to his daughter/Linda for continued conversation regarding diagnosis, prognosis, goals of care and end-of-life wishes.  Focus of care is full comfort and dignity.  Plan of  Care:  -Focus of care is comfort quality and dignity -DNR/DNI -No further diagnostics, or PT/OT therapies -No artificial feeding now or in the future/sips and bites as tolerated with known risk of aspiration -Scheduled Roxanol for underlying chronic pain -The management specific to dyspnea, agitation and breakthrough pain - residential hospice for end-of-life care/prognosis is less than 2 weeks  Questions and concerns addressed   Discussed with Dr Renaldo Reel   Total time spent on the unit was 25 minutes  Greater than 50% of the time was spent in counseling and coordination of care  Lorinda Creed NP  Palliative Medicine Team Team Phone # [phone] Pager [phone]

## 2018-01-09 NOTE — Clinical Social Work Note (Signed)
Clinical Social Work Assessment  Patient Details  Name: [name] MRN: [id number] Date of Birth: [birth year]  Date of referral:  01/09/18               Reason for consult:  Facility Placement, End of Life/Hospice                Permission sought to share information with:  Facility Sport and exercise psychologist, Family Supports Permission granted to share information::  Yes, Verbal Permission Granted  Name::     [employer]::  [place] [employer]  Relationship::  Daughter  Contact Information:     Housing/Transportation Living arrangements for the past 2 months:  [place] of Information:  Adult Children Patient Interpreter Needed:  None Criminal Activity/Legal Involvement Pertinent to Current Situation/Hospitalization:  No - Comment as needed Significant Relationships:  Adult Children, Spouse Lives with:  Self, Spouse, Facility Resident Do you feel safe going back to the place where you live?  Yes Need for family participation in patient care:  Yes (Comment)  Care giving concerns:  Patient is at end of life and family would like to pursue residential hospice placement.   Social Worker assessment / plan:  CSW met with patient's daughter at bedside, who remembered CSW from when the patient was previously on the same unit during a recent admission. CSW discussed with the patient's daughter how the patient had been doing recently, and how the family had been hopeful to take him home again soon before all of this happened. CSW discussed referral to [facility] with the daughter, and how transfer to hospice home will work. CSW sent referral to [facility] admissions. CSW to continue to follow.  Employment status:    Nurse, adult PT Recommendations:  Not assessed at this time Information / Referral to community resources:     Patient/Family's Response to care:  Patient's family agreeable to residential hospice placement, with preference for [employer] [facility].  Patient/Family's Understanding of and Emotional Response to Diagnosis, Current Treatment, and Prognosis:  Patient's daughter discussed how devastated the family is right now, that the patient had made tremendous progress at [facility]: he had passed their swallowing program, was able to take a few steps, and they had been talking about taking him home soon. Patient's daughter cried and talked about how hard this has been for her to accept, but that she's at peace with it and knows this is what's best for him, even though she's heartbroken. Patient's daughter appreciative of CSW assistance and of hospital care received.  Emotional Assessment Appearance:  Appears stated age Attitude/Demeanor/Rapport:  Unable to Assess Affect (typically observed):  Unable to Assess Orientation:    Alcohol / Substance use:  Not Applicable Psych involvement (Current and /or in the community):  No (Comment)  Discharge Needs  Concerns to be addressed:  Care Coordination Readmission within the last 30 days:  No Current discharge risk:  Terminally ill Barriers to Discharge:  Continued Medical Work up   [employer], [place] 01/09/2018, 1:25 PM

## 2018-01-09 NOTE — Progress Notes (Signed)
Internal Medicine Attending  Date: 01/09/2018  Patient name: [name] Medical record number: [id number] Date of birth: 04-27-1930 Age: 84 y.o. Gender: male  I saw and evaluated the patient. I reviewed the resident's note by Dr. Renaldo Reel and I agree with the resident's findings and plans as documented in her progress note.  When seen on rounds this morning [name] was resting comfortably but was not arousable and did not follow commands. He continues with the Cheyne-Stokes respirations. We appreciate the assistance of neurology and palliative care. Although his family is heartbroken they understand his prognosis and are asking for residential hospice. We are now awaiting a opening at [facility]

## 2018-01-09 NOTE — Progress Notes (Signed)
[facility] Liaison RN visit.  Received request from [name], CSW for family interest in [facility]. Chart reviewed and spoke with daughter [name] and son [name] to acknowledge referral. Unfortunately [facility] is not able to offer a room today. Family and CSW are aware [facility] will follow up with CSW and family tomorrow or sooner if a room becomes available. Please do not hesitate to call with questions.  Thank you for this referral.  Alexander Saas, RN, [facility]  [facility] Liaison [phone]  [facility] Liaisons are on [place]

## 2018-01-09 NOTE — Progress Notes (Signed)
SLP Cancellation Note  Patient Details Name: CHUE BERKOVICH MRN: [id number] DOB: 1930-03-04   Cancelled treatment:       Reason Eval/Treat Not Completed: Other (comment). Checked in with family, no further SLP needs at this point given plan of care. Will sign off.    Yaniyah Koors, Riley Nearing 01/09/2018, 10:25 AM

## 2018-01-09 NOTE — Progress Notes (Signed)
   Subjective:  [name] was lying in bed this morning. He was able to open his eyes briefly to voice, but closes them quickly. Continues to be nonverbal, but able to squeeze hand on left side today. Not eating. Palliative team to work on placement in residential hospice today. Discussed with family at bedside - they are in agreement with the plan and feel that patient does appear comfortable and is not agitated.  Objective:  Vital signs in last 24 hours: Vitals:   01/08/18 1900 01/08/18 2032 01/09/18 0041 01/09/18 0537  BP:  119/69 113/86 (!) 133/101  Pulse:  (!) 127 (!) 151 (!) 127  Resp: (!) 32 Temp:  98.3 F (36.8 C) 98.5 F (36.9 C) 98.7 F (37.1 C)  TempSrc:  Oral Oral Oral  SpO2:  95% 93% 96%  Weight:       GEN: Elderly male lying in bed, not alert. Intermittently opens eyes to voice, closes eyes again. Nonverbal. NAD RESP: appears comfortable on RA, Cheyne-Stokes breathing pattern CV: Irregularly irregular rhythm, tachycardic, no m/r/g EXT: Trace BLE edema NEURO: Unable to complete full neuro exam 2/2 AMS, intermittently follows commands. Able to squeeze my fingers on left UE. 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 Neurology Stroke team is on board and prognosis is very poor. Palliative Medicine team consulted yesterday, family planning for full comfort care and will work towards residential hospice placement.  Extensive left MCA infarct Small acute infarct  of R frontal white matter Hx of left pontine hemorrhagic stroke in 10/2017 Stroke team on board, patient intermittently awake, but nonverbal and likely aphasic given extent of infarct seen on imaging. Family is aware of poor prognosis and interested in Palliative Medicine consult to determine treatment goals and next steps. Patient is confirmed DNR/DNI and now full comfort care. - Neurology consulted; appreciate their assistance - Palliative Medicine consulted; appreciate their assistance - Palliative team working on residential hospice placement - Full comfort care - Sips for comfort - Morphine and Ativan available PRN  Hx of afib Previously on warfarin, however this was discontinued in March 2019 due to hemorrhagic stroke. Intermittently in afib with RVR, requiring IV metoprolol. Since patient unable to tolerate PO meds at this time, will continue IV as needed. - Holding home metoprolol and diltiazem 2/2 NPO status - IV metoprolol  PRN for HR >120  Hx of HTN Home regimen includes diltiazem  q8h and metoprolol  BID. BP 133/101 - Holding home diltiazem and metoprolol  Dispo: Anticipated discharge in approximately 1-2 day(s), pending residential hospice availability  Scherrie Gerlach, MD 01/09/2018, 7:03 AM Pager: Demetrius Charity [phone]

## 2018-01-10 DIAGNOSIS — I63412 Cerebral infarction due to embolism of left middle cerebral artery: Principal | ICD-10-CM

## 2018-01-10 DIAGNOSIS — R1310 Dysphagia, unspecified: Secondary | ICD-10-CM

## 2018-01-10 DIAGNOSIS — I69251 Hemiplegia and hemiparesis following other nontraumatic intracranial hemorrhage affecting right dominant side: Secondary | ICD-10-CM

## 2018-01-10 MED ORDER — MORPHINE SULFATE 10 MG/5ML PO SOLN: 5.0000 mg | ORAL | 0 refills | Status: AC | PRN

## 2018-01-10 MED ORDER — LORAZEPAM 1 MG PO TABS: 1.0000 mg | ORAL_TABLET | Freq: Four times a day (QID) | ORAL | 0 refills | Status: AC | PRN

## 2018-01-10 MED ORDER — MORPHINE SULFATE (CONCENTRATE) 10 MG/0.5ML PO SOLN: 5.0000 mg | Freq: Four times a day (QID) | ORAL | 0 refills | Status: AC

## 2018-01-10 NOTE — Progress Notes (Signed)
Patient is discharged to [facility] today.  Patient opens his eye spontanously, non verbal at his new current baseline.  Vital signs stable. IV discontinued. Report called to RN receiving patient at [facility].  Daughter is at bedside.

## 2018-01-10 NOTE — Progress Notes (Signed)
[facility] liaison RN visit.  Received request from Kathlee Nations, [place]  for family interest in [facility] with request for transfer today. Chart reviewed. Met with daughter, Vaughan Basta to confirm interest and explain services. Family agreeable to transfer today. CSW aware. Registration paper work completed.  Dr. Orpah Melter to assume care per family request. Please fax discharge summary to [phone]. RN please call report to [phone]. Please arrange transport for patient to arrive as soon as possible.    Please call with any hospice related questions.    Thank you.   Farrel Gordon, RN, [facility] Liaison [phone]     [facility] hospital liaisons are on [place].

## 2018-01-10 NOTE — Progress Notes (Signed)
CSW following for discharge plan. CSW alerted by Admissions at [facility] that the patient has a bed available today. CSW alerted MD. Marzetta Merino Admissions will follow up in completing paperwork with family.  CSW to continue to follow.  Blenda Nicely, [place] Clinical Social Worker [phone]

## 2018-01-10 NOTE — Progress Notes (Signed)
   Subjective:  [name] was lying in bed this morning. He was able to open his eyes briefly to voice, but closes them quickly. Continues to be nonverbal. No family at bedside. Appears comfortable.  Objective:  Vital signs in last 24 hours: Vitals:   01/09/18 1737 01/09/18 2053 01/10/18 0032 01/10/18 0400  BP: 134/82 (!) 159/102 (!) 163/96 (!) 122/98  Pulse: (!) 133 (!) 133 (!) 143 (!) 134  Resp: Temp: 97.7 F (36.5 C) 98 F (36.7 C) 97.7 F (36.5 C) (!) 97.3 F (36.3 C)  TempSrc: Axillary Oral Oral Temporal  SpO2: 96% 93% 94% 98%  Weight:       GEN: Elderly male lying in bed, appears comfortable. Intermittently opens eyes to voice, closes eyes again. Nonverbal. RESP: appears comfortable on RA, Cheyne-Stokes breathing pattern CV: Irregularly irregular rhythm, tachycardic, no m/r/g EXT: Trace BLE edema NEURO: Unable to complete full neuro exam 2/2 AMS, intermittently follows commands. Nonverbal  Assessment/Plan:  Principal Problem:   Stroke [facility]) Active Problems:   Oropharyngeal dysphagia   Acute ischemic stroke [facility])   Atrial fibrillation [facility])   Palliative care by specialist   DNR (do not resuscitate)   Pain, generalized  [name] is an 84yo male with PMH of atrial fibrillation (not on anticoagulation due to left pontine bleed in 10/2017), HTN, hyperlipidemia, and hx of left pontine hemorrhagic stroke with residual right-sided paraplegia who was admitted for extensive infarct of left MCA and small acute infarct in R frontal white matter. Given poor prognosis, family has decided to pursue full comfort care. Bed available at [facility] today.  Extensive left MCA infarct Small acute infarct of R frontal white matter Hx of left pontine hemorrhagic stroke in 10/2017 Family is aware of patient's poor prognosis and has elected to pursue full comfort care at this point. Informed by SW that bed is available for discharge to [facility] today. - D/c to [facility] today - Full comfort care - Sips for comfort - Morphine and Ativan available PRN  Hx of afib Hx of HTN Family has elected to pursue full comfort care. Telemetry discontinued.  Dispo: Anticipated discharge today.  Scherrie Gerlach, MD 01/10/2018, 6:11 AM Pager: Demetrius Charity [phone]

## 2018-01-10 NOTE — Progress Notes (Signed)
Discharge to: [facility] Anticipated discharge date: 01/10/18 Family notified: Yes, at bedside Transportation by: [facility]  CSW signing off.  Blenda Nicely [name] [phone]

## 2018-01-26 DEATH — deceased

## 2018-02-25 ENCOUNTER — Telehealth: Payer: Self-pay | Admitting: Interventional Cardiology

## 2018-02-25 NOTE — Telephone Encounter (Signed)
LVM for pt to call back to sched f/u appt with Katrinka BlazingSmith of APP per staff message.

## 2018-05-14 IMAGING — CT CT CERVICAL SPINE W/O CM
4 of 8 series · 11 of 33 positions shown, 12 images · non-contrast
Comparison: 10/27/2017 head CT.

CLINICAL DATA: Patient fell out of bed. History of infarct and
right-sided paralysis. Head trauma.

EXAM:
CT HEAD WITHOUT CONTRAST
CT CERVICAL SPINE WITHOUT CONTRAST
TECHNIQUE: Multidetector CT imaging of the head and cervical spine was
performed following the standard protocol without intravenous
contrast. Multiplanar CT image reconstructions of the cervical spine
were also generated.

[Series 5: head bone · axial · 0.44mm/px · z∈[-147,-61]mm · 3 of 87 slices shown]
[im 22/87  bone]
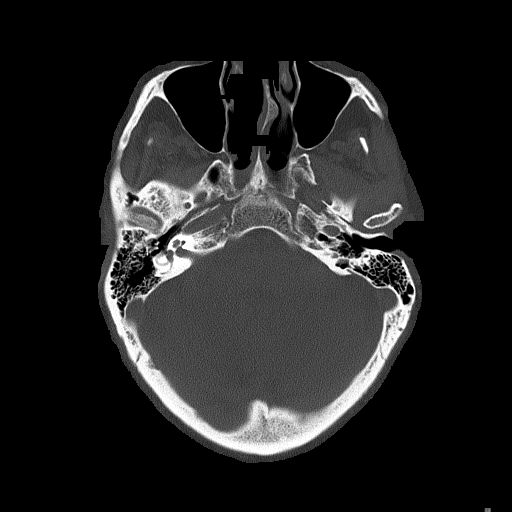
[im 44/87  bone]
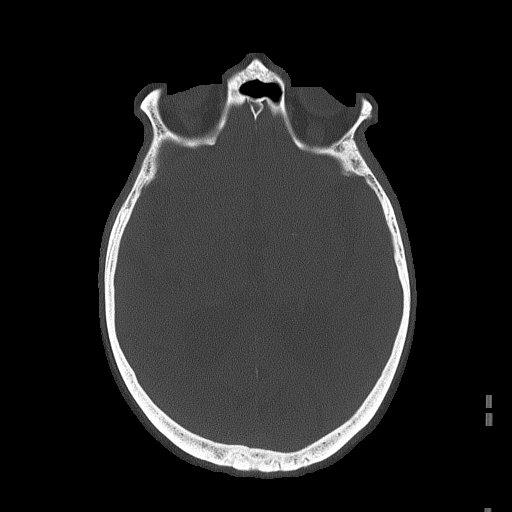
[im 65/87  bone]
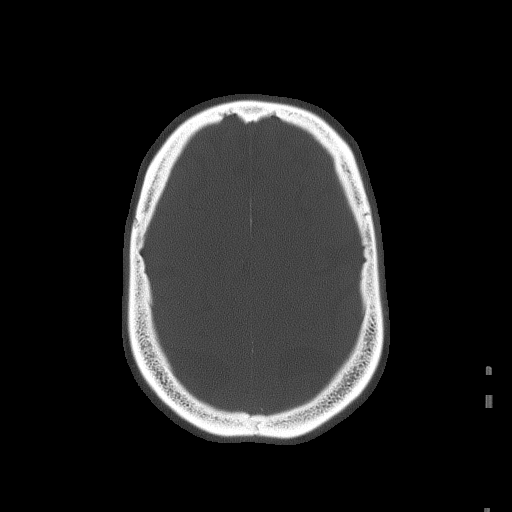

[Series 8: c_spine 2.0 st · axial · 0.43mm/px · z∈[-270,-218]mm · 2 of 80 slices shown, 3 images]
[im 27/80  soft-tissue]
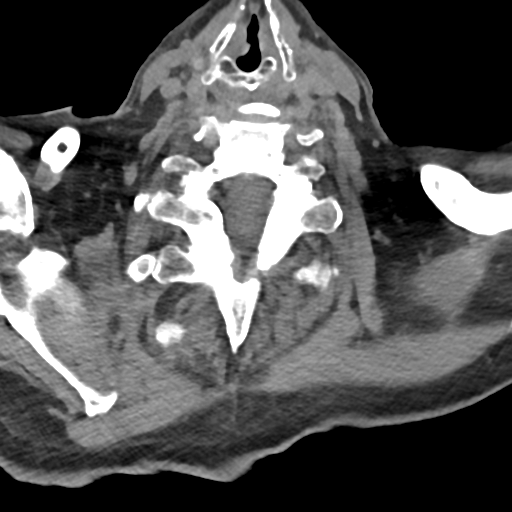
[im 27/80  bone]
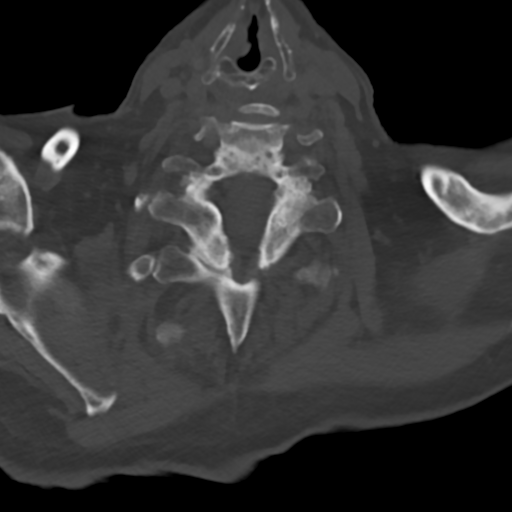
[im 53/80  bone]
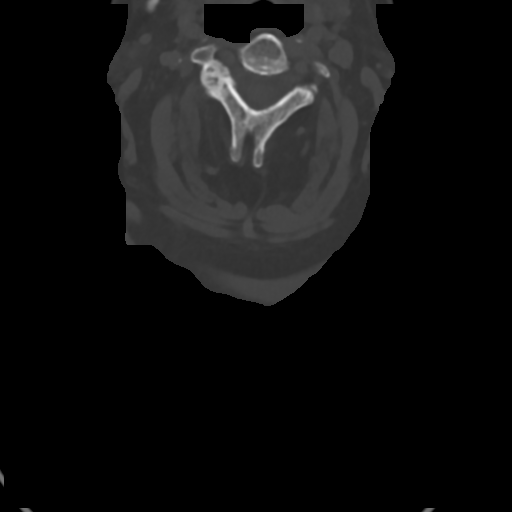

[Series 12: c_spine 2.0 sag bone · sagittal · 0.31mm/px · 5 of 52 slices shown]
[im 9/52  bone]
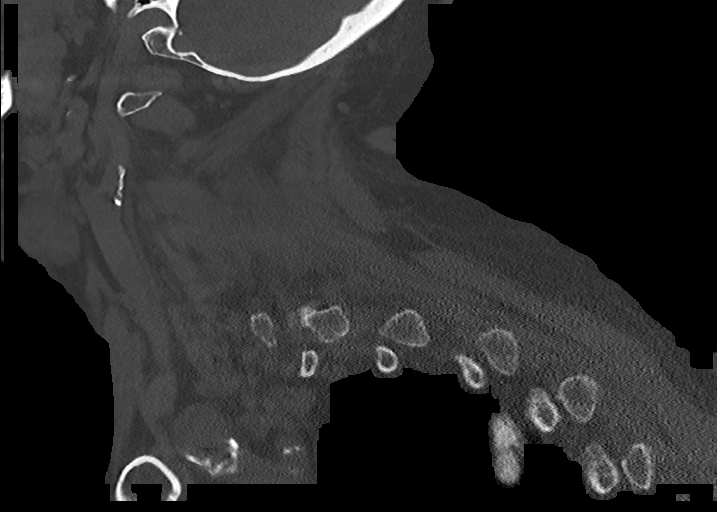
[im 18/52  bone]
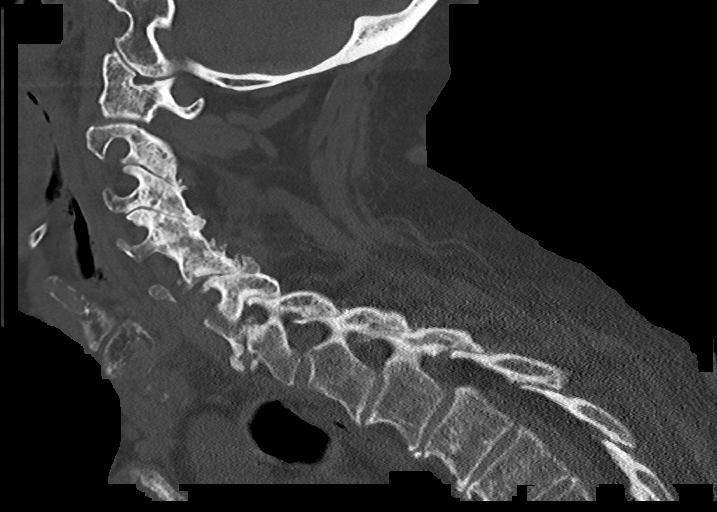
[im 26/52  bone]
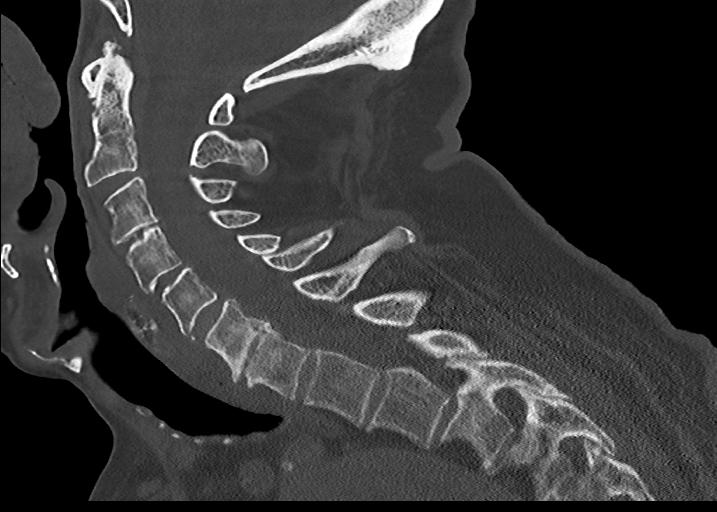
[im 35/52  bone]
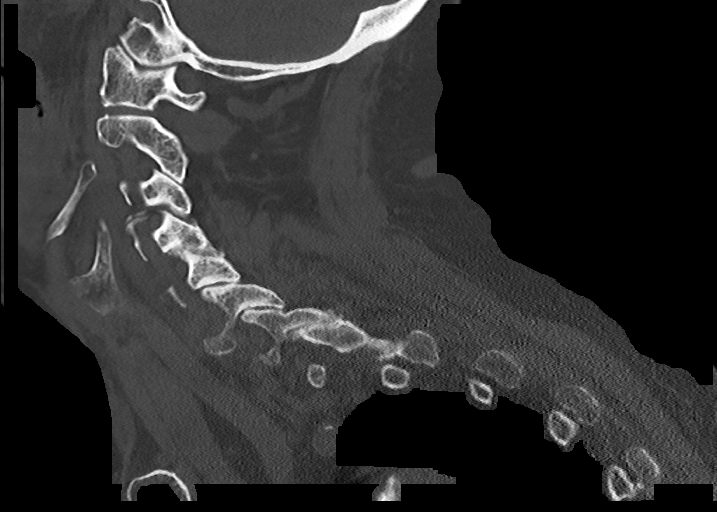
[im 43/52  bone]
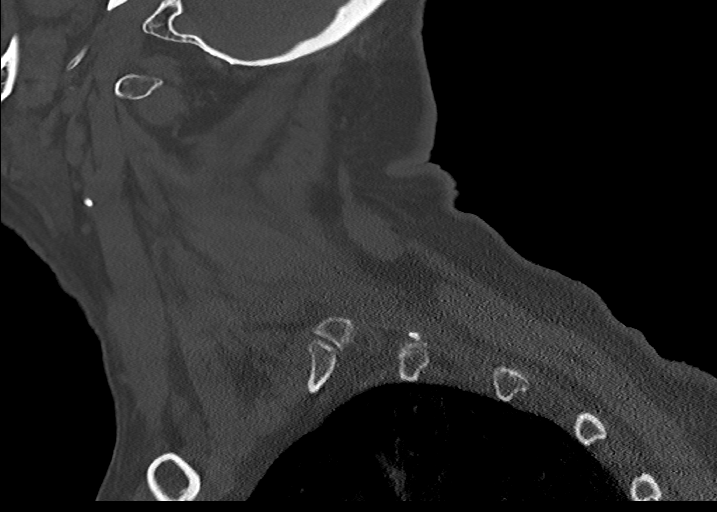

[Series 13: c_spine 2.0 upper cor bone · coronal · 0.23mm/px · 1 of 52 slices shown]
[im 26/52  bone]
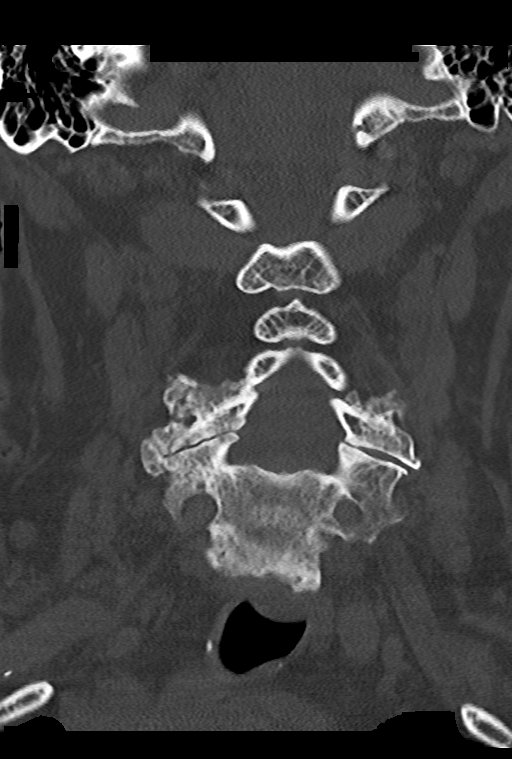

[11 of 33 positions shown; findings below may reference images not displayed]

FINDINGS: CT HEAD FINDINGS

Brain: Chronic left corona radiata lacunar infarct with moderate
small vessel ischemic disease and cerebral near complete resolution
of previously noted left pontine infarct with only a small focus of
residual hyperdensity currently identified measuring approximately 8
x 2 mm. No hydrocephalus. Midline fourth ventricle and basal
cisterns without effacement.

Vascular: Calcific atherosclerosis of the carotid siphons and both
vertebral arteries. No hyperdense vessel sign.

Skull: No acute osseous abnormality.

Sinuses/Orbits: Intact orbits and globes. Clear paranasal sinuses
and mastoids.

Other: None

CT CERVICAL SPINE FINDINGS

Alignment: Maintained cervical lordosis. Intact craniocervical
relationship. Osteoarthritis of atlantodental with joint space
narrowing and sclerosis.

Skull base and vertebrae: No fracture of the skull base. No
vertebral fracture. Listhesis.

Soft tissues and spinal canal: No prevertebral fluid or swelling. No
visible canal hematoma.

Disc levels: No central canal stenosis or focal disc herniations.
There is mild disc space narrowing at C3-4 with moderate to marked
disc space narrowing at C6-7. Small posterior marginal osteophytes
are seen at C6-7 minimal neural foraminal narrowing bilaterally.
Multilevel degenerative facet arthropathy with ankylosed appearance
of the C4-5 facets bilaterally.

Bilateral C3-4 uncovertebral joint osteoarthritis with uncinate
spurring.

Upper chest: Limited by respiratory motion. No dominant mass.
Scarring at the right lung apex. Bilateral upper lobe hazy opacities
right greater left may represent passive congestion, alveolitis or
pneumonitis among some possibilities.

Other: None
IMPRESSION: 1. Near complete resolution previously noted left pontine hemorrhage
now measuring approximately 8 x 2 mm in transverse by AP dimension.
2. Atrophy with moderate chronic small vessel ischemia, stable in
appearance with chronic left corona radiata lacunar infarct.
3. No acute cervical spine fracture. Degenerative disc disease at
C3-4 and C6-7 with multilevel degenerative facet arthropathy and
ankylosed appearance of the facets at C4-5 bilaterally.

## 2018-05-15 ENCOUNTER — Ambulatory Visit: Payer: Medicare HMO | Admitting: Adult Health
